# Patient Record
Sex: Female | Born: 1935
Health system: Southern US, Community
[De-identification: ages and names within clinical notes are randomized; demographics above are authoritative.]

## PROBLEM LIST (undated history)

## (undated) DIAGNOSIS — H8109 Meniere's disease, unspecified ear: Secondary | ICD-10-CM

## (undated) DIAGNOSIS — F419 Anxiety disorder, unspecified: Secondary | ICD-10-CM

## (undated) DIAGNOSIS — C7889 Secondary malignant neoplasm of other digestive organs: Secondary | ICD-10-CM

## (undated) DIAGNOSIS — H269 Unspecified cataract: Secondary | ICD-10-CM

## (undated) DIAGNOSIS — I10 Essential (primary) hypertension: Secondary | ICD-10-CM

## (undated) DIAGNOSIS — I8390 Asymptomatic varicose veins of unspecified lower extremity: Secondary | ICD-10-CM

## (undated) DIAGNOSIS — C50919 Malignant neoplasm of unspecified site of unspecified female breast: Secondary | ICD-10-CM

## (undated) DIAGNOSIS — F329 Major depressive disorder, single episode, unspecified: Secondary | ICD-10-CM

## (undated) DIAGNOSIS — N189 Chronic kidney disease, unspecified: Secondary | ICD-10-CM

## (undated) DIAGNOSIS — E785 Hyperlipidemia, unspecified: Secondary | ICD-10-CM

## (undated) DIAGNOSIS — G4700 Insomnia, unspecified: Secondary | ICD-10-CM

## (undated) DIAGNOSIS — J910 Malignant pleural effusion: Secondary | ICD-10-CM

## (undated) DIAGNOSIS — J40 Bronchitis, not specified as acute or chronic: Secondary | ICD-10-CM

## (undated) DIAGNOSIS — C7970 Secondary malignant neoplasm of unspecified adrenal gland: Secondary | ICD-10-CM

## (undated) DIAGNOSIS — J449 Chronic obstructive pulmonary disease, unspecified: Secondary | ICD-10-CM

## (undated) DIAGNOSIS — M7120 Synovial cyst of popliteal space [Baker], unspecified knee: Secondary | ICD-10-CM

## (undated) DIAGNOSIS — C787 Secondary malignant neoplasm of liver and intrahepatic bile duct: Secondary | ICD-10-CM

## (undated) DIAGNOSIS — C4490 Unspecified malignant neoplasm of skin, unspecified: Secondary | ICD-10-CM

## (undated) DIAGNOSIS — F32A Depression, unspecified: Secondary | ICD-10-CM

## (undated) DIAGNOSIS — H3320 Serous retinal detachment, unspecified eye: Secondary | ICD-10-CM

## (undated) DIAGNOSIS — Z923 Personal history of irradiation: Secondary | ICD-10-CM

## (undated) DIAGNOSIS — E079 Disorder of thyroid, unspecified: Secondary | ICD-10-CM

## (undated) HISTORY — PX: APPENDECTOMY: SHX54

## (undated) HISTORY — PX: TONSILLECTOMY: SUR1361

---

## 1989-05-08 HISTORY — PX: THYROIDECTOMY, PARTIAL: SHX18

## 1999-12-02 ENCOUNTER — Encounter: Payer: Self-pay | Admitting: Otolaryngology

## 1999-12-02 ENCOUNTER — Ambulatory Visit (HOSPITAL_COMMUNITY): Admission: RE | Admit: 1999-12-02 | Discharge: 1999-12-02 | Payer: Self-pay | Admitting: Otolaryngology

## 2000-04-24 ENCOUNTER — Other Ambulatory Visit: Admission: RE | Admit: 2000-04-24 | Discharge: 2000-04-24 | Payer: Self-pay | Admitting: General Surgery

## 2000-05-14 ENCOUNTER — Encounter (HOSPITAL_BASED_OUTPATIENT_CLINIC_OR_DEPARTMENT_OTHER): Payer: Self-pay | Admitting: General Surgery

## 2000-05-14 ENCOUNTER — Encounter (INDEPENDENT_AMBULATORY_CARE_PROVIDER_SITE_OTHER): Payer: Self-pay | Admitting: *Deleted

## 2000-05-14 ENCOUNTER — Ambulatory Visit (HOSPITAL_COMMUNITY): Admission: RE | Admit: 2000-05-14 | Discharge: 2000-05-14 | Payer: Self-pay | Admitting: General Surgery

## 2000-05-31 ENCOUNTER — Encounter (HOSPITAL_BASED_OUTPATIENT_CLINIC_OR_DEPARTMENT_OTHER): Payer: Self-pay | Admitting: General Surgery

## 2000-05-31 ENCOUNTER — Encounter (INDEPENDENT_AMBULATORY_CARE_PROVIDER_SITE_OTHER): Payer: Self-pay | Admitting: *Deleted

## 2000-05-31 ENCOUNTER — Inpatient Hospital Stay (HOSPITAL_COMMUNITY): Admission: RE | Admit: 2000-05-31 | Discharge: 2000-06-01 | Payer: Self-pay | Admitting: General Surgery

## 2000-05-31 DIAGNOSIS — C50919 Malignant neoplasm of unspecified site of unspecified female breast: Secondary | ICD-10-CM

## 2000-05-31 HISTORY — DX: Malignant neoplasm of unspecified site of unspecified female breast: C50.919

## 2000-06-19 ENCOUNTER — Encounter: Admission: RE | Admit: 2000-06-19 | Discharge: 2000-09-17 | Payer: Self-pay | Admitting: Radiation Oncology

## 2001-08-12 ENCOUNTER — Encounter: Admission: RE | Admit: 2001-08-12 | Discharge: 2001-08-12 | Payer: Self-pay | Admitting: Otolaryngology

## 2001-08-12 ENCOUNTER — Encounter: Payer: Self-pay | Admitting: Otolaryngology

## 2001-08-13 ENCOUNTER — Ambulatory Visit (HOSPITAL_BASED_OUTPATIENT_CLINIC_OR_DEPARTMENT_OTHER): Admission: RE | Admit: 2001-08-13 | Discharge: 2001-08-13 | Payer: Self-pay | Admitting: Otolaryngology

## 2001-08-13 ENCOUNTER — Encounter (INDEPENDENT_AMBULATORY_CARE_PROVIDER_SITE_OTHER): Payer: Self-pay | Admitting: Specialist

## 2002-12-19 ENCOUNTER — Encounter: Payer: Self-pay | Admitting: Ophthalmology

## 2002-12-23 ENCOUNTER — Ambulatory Visit (HOSPITAL_COMMUNITY): Admission: RE | Admit: 2002-12-23 | Discharge: 2002-12-23 | Payer: Self-pay | Admitting: Ophthalmology

## 2003-03-24 ENCOUNTER — Ambulatory Visit (HOSPITAL_COMMUNITY): Admission: RE | Admit: 2003-03-24 | Discharge: 2003-03-24 | Payer: Self-pay | Admitting: Ophthalmology

## 2003-05-09 HISTORY — PX: PITUITARY SURGERY: SHX203

## 2003-07-23 ENCOUNTER — Encounter: Admission: RE | Admit: 2003-07-23 | Discharge: 2003-07-23 | Payer: Self-pay | Admitting: Internal Medicine

## 2003-08-12 ENCOUNTER — Ambulatory Visit (HOSPITAL_COMMUNITY): Admission: RE | Admit: 2003-08-12 | Discharge: 2003-08-12 | Payer: Self-pay | Admitting: Internal Medicine

## 2003-08-27 ENCOUNTER — Ambulatory Visit (HOSPITAL_COMMUNITY): Admission: RE | Admit: 2003-08-27 | Discharge: 2003-08-27 | Payer: Self-pay | Admitting: Internal Medicine

## 2003-09-28 ENCOUNTER — Encounter (INDEPENDENT_AMBULATORY_CARE_PROVIDER_SITE_OTHER): Payer: Self-pay | Admitting: *Deleted

## 2003-09-28 ENCOUNTER — Inpatient Hospital Stay (HOSPITAL_COMMUNITY): Admission: RE | Admit: 2003-09-28 | Discharge: 2003-10-02 | Payer: Self-pay | Admitting: Otolaryngology

## 2003-10-09 ENCOUNTER — Inpatient Hospital Stay (HOSPITAL_COMMUNITY): Admission: AD | Admit: 2003-10-09 | Discharge: 2003-10-15 | Payer: Self-pay | Admitting: Internal Medicine

## 2006-07-13 ENCOUNTER — Encounter: Admission: RE | Admit: 2006-07-13 | Discharge: 2006-07-13 | Payer: Self-pay | Admitting: Internal Medicine

## 2006-10-06 ENCOUNTER — Encounter: Admission: RE | Admit: 2006-10-06 | Discharge: 2006-10-06 | Payer: Self-pay | Admitting: Neurosurgery

## 2007-07-09 ENCOUNTER — Encounter: Admission: RE | Admit: 2007-07-09 | Discharge: 2007-07-09 | Payer: Self-pay | Admitting: Internal Medicine

## 2007-08-02 ENCOUNTER — Encounter: Admission: RE | Admit: 2007-08-02 | Discharge: 2007-08-02 | Payer: Self-pay | Admitting: Internal Medicine

## 2007-08-05 ENCOUNTER — Encounter: Admission: RE | Admit: 2007-08-05 | Discharge: 2007-08-05 | Payer: Self-pay | Admitting: Internal Medicine

## 2007-08-15 ENCOUNTER — Encounter: Admission: RE | Admit: 2007-08-15 | Discharge: 2007-08-15 | Payer: Self-pay | Admitting: Internal Medicine

## 2007-09-04 ENCOUNTER — Ambulatory Visit: Payer: Self-pay | Admitting: Hematology & Oncology

## 2007-10-02 ENCOUNTER — Encounter: Admission: RE | Admit: 2007-10-02 | Discharge: 2007-10-02 | Payer: Self-pay | Admitting: Surgery

## 2007-10-02 ENCOUNTER — Ambulatory Visit (HOSPITAL_COMMUNITY): Admission: RE | Admit: 2007-10-02 | Discharge: 2007-10-02 | Payer: Self-pay | Admitting: Surgery

## 2007-10-02 ENCOUNTER — Encounter (INDEPENDENT_AMBULATORY_CARE_PROVIDER_SITE_OTHER): Payer: Self-pay | Admitting: Surgery

## 2007-10-02 DIAGNOSIS — C50919 Malignant neoplasm of unspecified site of unspecified female breast: Secondary | ICD-10-CM

## 2007-10-02 HISTORY — DX: Malignant neoplasm of unspecified site of unspecified female breast: C50.919

## 2007-10-25 ENCOUNTER — Ambulatory Visit: Payer: Self-pay | Admitting: Oncology

## 2007-11-04 ENCOUNTER — Ambulatory Visit: Admission: RE | Admit: 2007-11-04 | Discharge: 2008-01-12 | Payer: Self-pay | Admitting: Radiation Oncology

## 2007-11-29 LAB — CBC WITH DIFFERENTIAL/PLATELET
BASO%: 0.5 % (ref 0.0–2.0)
HCT: 40.8 % (ref 34.8–46.6)
HGB: 14.2 g/dL (ref 11.6–15.9)
MCHC: 34.8 g/dL (ref 32.0–36.0)
MONO#: 0.4 10*3/uL (ref 0.1–0.9)
NEUT%: 55 % (ref 39.6–76.8)
WBC: 7.7 10*3/uL (ref 3.9–10.0)
lymph#: 2.9 10*3/uL (ref 0.9–3.3)

## 2008-01-28 ENCOUNTER — Encounter: Admission: RE | Admit: 2008-01-28 | Discharge: 2008-01-28 | Payer: Self-pay | Admitting: Neurosurgery

## 2008-06-15 ENCOUNTER — Encounter: Admission: RE | Admit: 2008-06-15 | Discharge: 2008-06-15 | Payer: Self-pay | Admitting: Internal Medicine

## 2008-06-19 ENCOUNTER — Encounter: Admission: RE | Admit: 2008-06-19 | Discharge: 2008-06-19 | Payer: Self-pay | Admitting: Orthopedic Surgery

## 2010-02-01 ENCOUNTER — Encounter: Admission: RE | Admit: 2010-02-01 | Discharge: 2010-02-01 | Payer: Self-pay | Admitting: Neurosurgery

## 2010-05-29 ENCOUNTER — Encounter: Payer: Self-pay | Admitting: Internal Medicine

## 2010-05-30 ENCOUNTER — Encounter: Payer: Self-pay | Admitting: Neurosurgery

## 2010-09-20 NOTE — Op Note (Signed)
NAMEASHLAND, Brianna Duke              ACCOUNT NO.:  [id number]   MEDICAL RECORD NO.:  [id number]          PATIENT TYPE:  AMB   LOCATION:  SDS                          FACILITY:  [facility]   PHYSICIAN:  Thomas A. Cornett, M.D.DATE OF BIRTH:  02/11/1929   DATE OF PROCEDURE:  10/02/2007  DATE OF DISCHARGE:  10/02/2007                               OPERATIVE REPORT   PREOPERATIVE DIAGNOSIS:  Stage I right breast cancer.   POSTOPERATIVE DIAGNOSIS:  Stage I right breast cancer.   PROCEDURE:  1. Right breast needle-localized lumpectomy.  2. Right axillary sentinel lymph node mapping with injection of      methylene blue dye.   SURGEON:  Maisie Fus A. Cornett, MD   ASSISTANT:  Junious Silk, NP.   ESTIMATED BLOOD LOSS:  10 mL.   SPECIMEN:  1. Right breast mass.  2. Two right axillary sentinel nodes negative by touch prep.   DRAINS:  None.   INDICATIONS FOR PROCEDURE:  The patient is a pleasant 77 year old female  who is found to have a right breast mass on routine screening mammogram.  Core biopsy showed roughly a 1-cm ER/PR positive, HER-2/neu negative  right breast cancer in the upper outer quadrant.  She presents today for  breast conserving measures after discussion of both mastectomy versus  breast conserving options.  She had a history of left breast cancer  diagnosed back in 1992 and undergoing lumpectomy, postoperative  radiation therapy, and left axillary node dissection for a IIB left  breast cancer.   DESCRIPTION OF PROCEDURE:  The patient was brought to the operating room  after undergoing needle localization and right breast injection with  technetium sulfur colloid for sentinel lymph node mapping.  After  induction of general anesthesia, right breast and right axilla were  prepped and draped in sterile fashion.  The axilla was addressed first.  NeoProbe was used and a hot spot was located in the right axilla.  Incision was made in the right axilla, and dissection was carried  down.  Two blue hot sentinel nodes were identified.  Of note, 4 mL of methylene  blue dye was injected in the subareolar position and massaged for 5  minutes prior to doing this.  These nodes were sent to pathology and  were negative by touch prep.  Hemostasis was excellent.   Next, a lumpectomy was done.  Localizing wires were identified.  Curvilinear incision was made of the central upper portion of the breast  and the wire.  All tissue around the wire was excised, sent to  radiology, and this was found to be adequate for radiographs.  Mass clip  and wire were in it.  I inspected the biopsy cavity and saw no residual  tumor.  I then irrigated and closed with 3-0 Vicryl and a 4-0 Monocryl  subcuticular stitch.  The right axilla was examined, and this was  cleaned without signs of bleeding.  This was closed in layers, the deep  layer with a 3-0  Vicryl and a 4-0 Monocryl subcuticular stitch.  Dermabond was applied.  All final counts of sponge, needle, and instruments  were found be  correct at this portion of case.  The patient was awoke and taken to  recovery in satisfactory condition.      Thomas A. Cornett, M.D.  Electronically Signed     TAC/MEDQ  D:  10/02/2007  T:  10/03/2007  Job:  [id number]   cc:   [facility]  Christianne Dolin, MD

## 2010-09-23 NOTE — Op Note (Signed)
NAME:  Brianna Duke, Brianna Duke                        ACCOUNT NO.:  [id number]   MEDICAL RECORD NO.:  [id number]                   PATIENT TYPE:  INP   LOCATION:  3106                                 FACILITY:  [facility]   PHYSICIAN:  Kristine Garbe. Ezzard Standing, M.D.         DATE OF BIRTH:  02/11/1929   DATE OF PROCEDURE:  DATE OF DISCHARGE:                                 OPERATIVE REPORT   PREOPERATIVE DIAGNOSES:  1. Pituitary tumor.  2. Vocal cord polyps.   POSTOPERATIVE DIAGNOSES:  1. Pituitary tumor.  2. Vocal cord polyps.   OPERATION:  1. Transsphenoidal resection of pituitary tumor.  2. Microlaryngoscopy with excision of vocal cord polyps.   SURGEON:  Kristine Garbe. Ezzard Standing, M.D.   Mammie LorenzoHewitt Shorts, M.D.   ANESTHESIA:  General endotracheal.   COMPLICATIONS:  None.   ESTIMATED BLOOD LOSS:  100 mL.   BRIEF CLINICAL NOTE:  Brianna Duke is a 77 year old female who has had a  diagnosis of pituitary tumor by Dr. Newell Coral and is taken to the operating  room at this time for transseptal, transsphenoidal resection of pituitary  tumor.  She has also had increasing hoarseness over the last several years,  has a history of smoking, and on examination has bilateral vocal cord polyps  consistent with chronic polypoid vocal cord laryngitis.  She is taken to the  operating room at this time and will have microlaryngoscopy and excision of  vocal cord polyps at the same time as the resection of the pituitary tumor.   DESCRIPTION OF PROCEDURE:  After adequate endotracheal anesthesia, the  patient was positioned on the OR bed by Dr. Newell Coral.  Nose was then prepped  with cotton pledgets soaked in 4% cocaine solution and the septum and floor  of the nose were injected with Xylocaine with epinephrine.  The nose and  abdomen were then prepped with Betadine solution and draped out with sterile  towels.  First myself, Dr. Ezzard Standing, did the approach through the right  nostril.  An extended  hemitransfixion incision was made along the caudal  edge of the septum on the right side and extended down to the floor of the  nose.  Mucoperiosteal and mucoperichondrial flaps were elevated posteriorly.  On elevating the mucoperichondrium off of the septal cartilage, the majority  of the septal cartilage was missing and there was just fibrous adhesions  with very minimal remnants of fibrocartilaginous tissue.  I suspect this may  be related to either previous trauma or previous surgery on the septum.  There is really no available cartilage to harvest for later use.  On more  posterior dissection, bony septum was still present and portions of the bony  septum and bony vomer were preserved for later use.  Mucoperichondrium and  mucoperiosteum were elevated off of the floor of the nose on the left side,  along the right side of the septum, and extending down to the floor of the  nose on the left side.  The remaining cartilage was reflected into the left  airway.  The Hardy retractors were then positioned to expose the anterior  wall of the sphenoid sinus.  Using the small osteotome, the sphenoidotomy  was performed.  This was enlarged with rongeurs and Kerrisons superiorly and  laterally.  After obtaining adequate exposure of the sella, Dr. Newell Coral  performed a resection of the sphenoid tumor.  After resection of the  sphenoid tumor, I was called back in to close the approach.  The sphenoid  sinus was packed with fat and Gelfoam.  The septum was brought back to  midline, mucoperichondrial and mucoperiosteal flaps were brought back, and  the hemitransfixion incision was closed with interrupted 4-0 chromic  sutures, the septum was basted with a 3-0 chromic suture, and Doyle splints  were secured to either side of the septum with a 3-0 nylon suture.  The nose  was then packed with Telfa soaked in bacitracin ointment bilaterally.  Upon  completion of the closure of the transseptal,  transsphenoidal resection of  the pituitary tumor, microlaryngoscopy was performed.  The throat pack was  removed, oropharynx was suctioned, and direct laryngoscopy was performed.  The base of tongue and epiglottis were normal.  On evaluation of the vocal  cords, the patient had bilateral vocal cord edema and vocal cord polyps  consistent with chronic polypoid laryngitis.  The polypoid tissue was  grasped and the redundant mucosa and submucosal tissue was resected with cup  forceps and scissors.  Cotton pledgets soaked in 1:100,000 epinephrine were  placed for hemostasis.  Photos were obtained.  This completed the procedure.  The patient was subsequently awakened from anesthesia and transferred to the  recovery room postop doing well.   DISPOSITION:  Madinah will be admitted to the neuro intensive care unit.  Will  plan on removing the nasal packing in two to three days.  Will recommend  voice rest for two weeks and antacid therapy for two weeks.  Will have her  follow up in my office in 10 to 12 days for recheck and remove the septal  splints.                                               Kristine Garbe. Ezzard Standing, M.D.    CEN/MEDQ  D:  09/28/2003  T:  09/29/2003  Job:  [id number]   cc:   Hewitt Shorts, M.D.  [address]  [place]  [place] [address]  Fax: [phone]

## 2010-09-23 NOTE — H&P (Signed)
NAME:  Brianna Duke, Brianna Duke                        ACCOUNT NO.:  [id number]   MEDICAL RECORD NO.:  [id number]                   PATIENT TYPE:  INP   LOCATION:  3106                                 FACILITY:  [facility]   PHYSICIAN:  Hewitt Shorts, M.D.            DATE OF BIRTH:  02/11/1929   DATE OF ADMISSION:  09/28/2003  DATE OF DISCHARGE:                                HISTORY & PHYSICAL   HISTORY OF PRESENT ILLNESS:  The patient is a 77 year old, right handed,  white female who was evaluated for pituitary mass.  It was an incidental  finding on an MRI done for evaluation of nightmares.  The patient has had  some longstanding occasional difficulties with headaches.  She has some  headache a couple times a week.  She describes mild weight gain due to a  relatively sedentary level of activity.  She denies any visual difficulties,  and she denies any breast discharge.  The patient was evaluated with an MRI  which showed a 1-cm diameter suprasellar mass that abuts the anterior aspect  of the optic chiasm.  The patient underwent ophthalmologic evaluation with  Dr. Cecilie Kicks.  He found an old superior visual field defect but the visual  fields were otherwise normal.  Her endocrinologic evaluation was  unremarkable.  The patient is admitted now for transsphenoidal resection of  the pituitary mass.   PAST MEDICAL HISTORY:  No history of hypertension, myocardial infarction,  cancer, stroke, diabetes, peptic ulcer disease, or lung disease.   PREVIOUS SURGERY:  1. Thyroid surgery.  She is on replacement with Synthroid.  2. She underwent right ear surgery.  3. She has had bilateral cataract surgery, last year.  4. Detached retina, treated about 30 years ago.   ALLERGIES:  She denies allergies to medications.   CURRENT MEDICATIONS:  Synthroid.   FAMILY HISTORY:  Parents have both passed on.   SOCIAL HISTORY:  The patient is divorced.  She is retired.  She smokes a  pack a day.  She has been  smoking for 25 years.  She drinks alcoholic  beverages rarely.  She denies a history of substance abuse.   REVIEW OF SYSTEMS:  Notable for what is described in her history of present  illness and past medical history, but is otherwise unremarkable.   PHYSICAL EXAMINATION:  GENERAL:  The patient is a well-developed, well-  nourished, white female in no acute distress.  VITAL SIGNS:  Temperature 97.9, pulse 72, blood pressure 122/71, respiratory  rate 20.  Height 5 foot 4, weight 159 pounds.  LUNGS:  Clear to auscultation.  She has symmetrical respirations.  HEART:  Regular rate and rhythm.  S1 S2.  There is no murmur.  ABDOMEN:  Soft, nondistended.  Bowel sounds are present.  EXTREMITIES:  Show no clubbing, cyanosis, edema.  NEUROLOGIC:  Shows the patient awake, alert, fully oriented.  Speech is  fluent.  She has good comprehension.  Cranial nerves show visual fields are  intact to confrontation with a red target.  Pupils equal, round and reactive  to light and about 2.5-mm bilaterally.  Extraocular movements are intact.  Facial movement is symmetrical.  Hearing is present bilaterally.  Palate  movement is symmetrical.  Shoulder shrug is symmetrical.  Tongue is midline.  Motor:  Shows 5/5 strength in the upper and lower extremities.  She has no  drift of the upper extremities.  She has a normal gait.  Sensation is intact  to pinprick.  Reflexes are minimal to trace in the upper and lower  extremities including the biceps, brachialis, triceps, quadriceps, and  gastrocnemii.   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   Both clinically and laboratory wise, she has no endocrinopathy, other than  for thyroid replacement she requires because of her previous thyroid  surgery.  Visual fields are intact.   PLAN:  The patient is to be admitted for transsphenoidal resection of  pituitary mass done in conjunction with Dr. Kristine Garbe. Newman from the   ENT service.  I have had an opportunity to discuss alternatives to surgery  including monitoring with serial MRI scans as well as surgical intervention.  We discussed the details of surgery and limitations postoperatively, and  risks to include risk of infection including meningitis and sinusitis, the  risk of bleeding possibly requiring transfusion, the risk of neurologic  dysfunction with loss of vision, paralysis, and death, anesthetic risk of  myocardial infarction, stroke, paralysis and death.   The patient strongly wishes to go ahead with surgery, and, therefore, is  admitted for such.                                                Hewitt Shorts, M.D.    RWN/MEDQ  D:  09/28/2003  T:  09/28/2003  Job:  [id number]   cc:   Kristine Garbe. Ezzard Standing, M.D.  [address]  [place] [address]  Fax: [phone]   patient's chart

## 2010-09-23 NOTE — Op Note (Signed)
NAMEBREANN, LOSANO NO.:  [id number]   MEDICAL RECORD NO.:  [id number]                   PATIENT TYPE:  OIB   LOCATION:  2899                                 FACILITY:  [facility]   PHYSICIAN:  Guadelupe Sabin, M.D.             DATE OF BIRTH:  [birth year]   DATE OF PROCEDURE:  03/24/2003  DATE OF DISCHARGE:  03/24/2003                                 OPERATIVE REPORT   DATE OF SURGERY:  March 24, 2003.   PREOPERATIVE DIAGNOSES:  Senile nuclear cataract right eye, old scleral  buckling for retinal detachment 1973.   OPERATION:  Planned extracapsular cataract extraction--phacoemulsification,  primary insertion of posterior chamber intraocular lens implant right eye.   SURGEON:  Guadelupe Sabin, M.D.   ASSESSMENT:  Nurse.   ANESTHESIA:  General.   OPERATIVE PROCEDURE:  After the patient was prepped and draped with suitable  anesthesia, the patient was also given 1 mL of retrobulbar blocking  consisting of 0.75 Marcaine and 4% Xylocaine with Wydase added.  The lid  speculum was inserted in the right eye.  The eye was turned downward and a  superior rectus traction suture placed.  Tonometry was recorded at 6 to 7  scale units with a 5.5 gram weight indicating a normal intraocular pressure.  A peritomy was performed adjacent to the limbus from 11 to 1 o'clock  position.  The corneoscleral junction was cleaned and a corneoscleral groove  made with a 45 degree Superblade.  The anterior chamber was then entered  with the 2.5 mm diamond keratome at the 12 o'clock position and the 15  degree blade at the 2:30 position.  Using a bent 26 gauge needle on a  OcuCoat syringe, a circular capsulorrhexis was begun and then completed with  the Grabow forceps.  Hydrodissection and hydrodelineation were performed  using 1% Xylocaine.  The 30 degree phacoemulsification tip was then inserted  with slow controlled emulsification of the lens nucleus.  Total  ultrasonic  time 57 seconds, average power levels 14%, total amount of fluid used 70 mL.  Following removal of the nucleus, the residual cortex was aspirated with the  irrigation aspiration tip.  The posterior capsule appeared intact with a  brilliant red fundus reflex.  Was therefore elected to insert an [employer]er[employer] SI 40 NB silicone three-piece posterior chamber intraocular  lens implant, diopter strength plus 21.00.  This was inserted with the  McDonald forceps into the anterior chamber and then centered into the  capsular bag.  The lens appeared to be well centered.  The Healon and  OcuCoat which had been used during the procedure was aspirated and replaced  with balanced salt solution and Miochol ophthalmic solution.  The operative  incisions appeared to be self-sealing.  Since the patient, however, was  under general anesthesia, it was elected to place one 10-0 interrupted nylon  suture across the incision in  case the patient had involuntary movements on  awakening.  Maxitrol ointment was instilled in the conjunctival cul-de-sac  and a light patch applied and protective shield.  Duration of procedure and  anesthesia administration 45 minutes.  The patient tolerated the procedure  well in general, left the operating room for the recovery room in good  condition.                                              Guadelupe Sabin, M.D.   Davy Pique  D:  05/08/2003  T:  05/08/2003  Job:  [id number]

## 2010-09-23 NOTE — Op Note (Signed)
[place]. [facility]  Patient:    Brianna Duke, Brianna Duke                       MRN: [id number] Proc. Date: 05/14/00 Attending:  Luisa Hart L. Lurene Shadow, M.D.                           Operative Report  PREOPERATIVE DIAGNOSIS:  Left breast mass, rule out carcinoma.  POSTOPERATIVE DIAGNOSIS:  Invasive carcinoma, left breast.  PROCEDURE:  Wide excision of left breast mass.  SURGEON:  Mardene Celeste. Lurene Shadow, M.D.  ASSISTANT:  Nurse.  ANESTHESIA:  General.  INDICATION:  The patient is a 77 year old female presenting with a palpable left breast mass, which on mammogram is suspicious, BI-RADS 4.  She underwent fine needle aspirate, which was equivocal with results showing atypical cells but no definite carcinoma.  She is brought to the operating room for excisional biopsy.  DESCRIPTION OF PROCEDURE:  Following induction of anesthesia, the patient was positioned supinely and the left breast was prepped and draped to be included in the sterile operative field.  The mass, which was located in the 6 oclock axis of the left breast, is approached through an elliptical incision, deepened through the skin and subcutaneous tissues, and made transversely. The mass was dissected free on all sides down to the chest wall and removed and forwarded for pathologic evaluation.  Frozen section showed invasive carcinoma with the superior margin being closest at approximately 3 mm. Hemostasis was secured with electrocautery.  Sponge, instruments, and sharp counts were verified.  Subcutaneous tissues closed with interrupted 3-0 Vicryl suture, skin closed with 5-0 Monocryl and reinforced with Steri-Strips. Sterile dressings applied.  Anesthetic reversed.  Patient removed from the operating room to the recovery room in stable condition, having tolerated the procedure well. DD:  05/14/00 TD:  05/14/00 Job: [id number]

## 2010-09-23 NOTE — Op Note (Signed)
[place]. [facility]  Patient:    Brianna Duke, Brianna Duke Visit Number: [id number] MRN: [id number]          Service Type: DSU Location: [facility] Attending Physician:  Waldon Merl Dictated by:   Keturah Barre, M.D. Proc. Date: 08/13/01 Admit Date:  08/13/2001 Discharge Date: 08/13/2001   CC:         Dr. 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  OPERATION PERFORMED:  Excision of squamous cell carcinoma 2.7 cm x 0.7 cm with rotational flap reconstruction.  SURGEON:  Keturah Barre, M.D.  ANESTHESIA:  Local with general standby.  INDICATIONS FOR PROCEDURE:  The patient is a 77 year old female who I biopsied a lesion in the right lower lid and it returned squamous cell carcinoma.  She now enters for excision of squamous cell carcinoma of the right lower eyelid with rotation flap reconstruction.  DESCRIPTION OF PROCEDURE:  The patient was placed supine position and the patient was prepped with Betadine and then the marking pen was used to mark off the area of excision.  Then also the superiorly based rotator flap.  The lesion was excised removing ample edges on this margin on this squamous cell carcinoma and then once this lesion was excised, all hemostasis was established with Bovie electrocoagulation and the flap was then developed using a new blade.  The flap site was closed, the donor site was then closed with 5-0 Ethilon and the flap was rotated laterally into the lower lid defect. All hemostasis was checked and completed and then the flap was sutured in placed using 5-0 Ethilon.  Once this was completed, the Steri-Strips were applied.  The patient tolerated the procedure well and was doing well postoperatively.  Follow-up will be in two days, then in a week, then two weeks, four weeks, six weeks, six  months and a year. Dictated by:   Keturah Barre, M.D. Attending Physician:  Waldon Merl DD:  08/13/01 TD:  08/13/01 Job: [id number]

## 2010-09-23 NOTE — H&P (Signed)
NAMEHENLEY, Brianna NO.:  [id number]   MEDICAL RECORD NO.:  [id number]                   PATIENT TYPE:  OIB   LOCATION:  2899                                 FACILITY:  [facility]   PHYSICIAN:  Guadelupe Sabin, M.D.             DATE OF BIRTH:  1928/04/17   DATE OF ADMISSION:  12/23/2002  DATE OF DISCHARGE:                                HISTORY & PHYSICAL   PREOPERATIVE DIAGNOSIS:  Senile nuclear cataract left eye.   POSTOPERATIVE DIAGNOSIS:  Senile nuclear cataract left eye.   OPERATION:  Planned extracapsular cataract extraction - phacoemulsification,  primary insertion of posterior chamber intraocular lens implant.   SURGEON:  Guadelupe Sabin, M.D.   ASSISTANT:  Nurse.   ANESTHESIA:  General plus retrobulbar injection with 4% Xylocaine, 0.75  Marcaine.   DESCRIPTION OF PROCEDURE:  After the patient had intubation and general  anesthesia, a lid speculum was inserted in the left eye.  The eye was turned  downward and a superior rectus traction suture placed.  A peritomy was  performed adjacent to the limbus from the 11 to 1 o'clock position.  The  corneoscleral junction was cleaned and a corneoscleral groove made with a 45  degree Superblade.  The anterior chamber was then entered with the 2.5 mm  diamond keratome at the 12 o'clock position and the 15 degree blade at the  2:30 position.  Using a bent 26-gauge needle on a Healon syringe, a circular  capsulorrhexis was begun and then completed with the Grabow forceps.  Hydrodissection and hydrodelineation were performed using 1% Xylocaine.  The  30 degree phacoemulsification tip was then inserted with slow controlled  emulsification of the lens nucleus.  Following removal of the nucleus, the  residual cortex was aspirated with the irrigation aspiration tip.  The  posterior capsule appeared intact with a brilliant red fundus reflex.  Total  ultrasonic time was 57 seconds, average power level  19%, total amount of  fluid use 95 mL.  It was then elected to insert an Aller[employer] Optics  SI40 NB silicone three-piece posterior chamber intraocular lens implant,  diopter strength +21.00.  This was inserted with the McDonald forceps into  the anterior chamber and then centered into the capsular bag using the  [facility] lens rotator.  The lens appeared to be well centered.  The Healon  which had been used throughout the procedure was aspirated and replaced with  balanced salt solution and Miochol ophthalmic solution.  A single 10.0  interrupted nylon suture was used to close the 12 o'clock incision.  TobraDex ophthalmic ointment and Pilopine ophthalmic ointment 4% were  instilled in the conjunctival cul-de-sac.  A light patch and protector  shield were applied to the operated left eye.  Duration of procedure was 45  minutes.  The patient left the operating room for the recovery room in good  condition.  Guadelupe Sabin, M.D.    HNJ/MEDQ  D:  12/23/2002  T:  12/23/2002  Job:  [id number]

## 2010-09-23 NOTE — Discharge Summary (Signed)
NAME:  Brianna Duke, Brianna Duke                        ACCOUNT NO.:  [id number]   MEDICAL RECORD NO.:  [id number]                   PATIENT TYPE:  INP   LOCATION:  3106                                 FACILITY:  [facility]   PHYSICIAN:  Hewitt Shorts, M.D.            DATE OF BIRTH:  02/11/1929   DATE OF ADMISSION:  09/28/2003  DATE OF DISCHARGE:  10/02/2003                                 DISCHARGE SUMMARY   ADMISSION HISTORY AND PHYSICAL:  This is a 77 year old woman who was found  to have a pituitary tumor on evaluation of nightmares.  She had no clinical  or laboratory evidence of endocrinopathy, nor any visual field deficit,  although the tumor did abut the optic chiasm, and decision was made to  proceed with resection of this lesion.  General examination was  unremarkable.  Neurological examination was intact.   HOSPITAL COURSE:  The patient was admitted and underwent transnodal  resection of the pituitary tumor in a combined neurosurgical and ENT  procedure with myself and Dr. Narda Bonds.  Dr. Ezzard Standing also performed a  microlaryngoscopy and excision of vocal cord polyps.  Pathology found  evidence of pituitary adenoma.  The patient has done well following surgery.  She has remained neurologically intact.  She has been able to be up and  ambulating.  She has had some relatively high urine outputs however.  Her  specific gravities, and more importantly, her serum sodiums have remained  stable without definitive evidence of diabetes insipidus.  She has never  required any treatment with TV, AVP or other form of vasopressin during the  hospitalization.  Dr. Ezzard Standing was able to remove her packings and she was  continued on antibiotics until the packings were removed.  She is asking to  be discharged to home.  She has been given instructions regarding activities  and followup.  She is to return to see Dr. Ezzard Standing in about a week and is to  return to see me in about 2 weeks.  I have also  recommended that she  followup with her primary physician in the next couple of weeks.  No  discharge prescriptions were given.  From the neurosurgical service Dr.  Ezzard Standing gave her a prescription for Nexium to reduce reflux.  She was advised  to use Tylenol or Advil as needed for discomfort.   DISCHARGE DIAGNOSIS:  Pituitary adenoma.                                                Hewitt Shorts, M.D.    RWN/MEDQ  D:  10/02/2003  T:  10/04/2003  Job:  [id number]   cc:   Kristine Garbe. Ezzard Standing, M.D.  [address]  [place] [address]  Fax: [phone]   Belia Heman.  Julian Reil, M.D.  [address]  [place], [place] [address]  Fax: [phone]

## 2010-09-23 NOTE — H&P (Signed)
Brianna Duke, Duke NO.:  [id number]   MEDICAL RECORD NO.:  [id number]                   PATIENT TYPE:  OIB   LOCATION:  2899                                 FACILITY:  [facility]   PHYSICIAN:  Guadelupe Sabin, M.D.             DATE OF BIRTH:  09-17-1927   DATE OF ADMISSION:  03/24/2003  DATE OF DISCHARGE:  03/24/2003                                HISTORY & PHYSICAL   This was a planned outpatient readmission of this 77 year old white female  admitted for cataract implant surgery of the right eye.   HISTORY OF PRESENT ILLNESS:  This patient had been noted to have  deterioration in vision in both eyes.  She has had a past scleral buckling  procedure performed on the right eye for retinal detachment.  Subsequently,  more recently, on December 23, 2002, the patient was admitted for elective  cataract implant surgery of the left eye.   Dictation ended at this point.                                                Guadelupe Sabin, M.D.    Davy Pique  D:  05/08/2003  T:  05/08/2003  Job:  [id number]

## 2010-09-23 NOTE — H&P (Signed)
Brianna Duke, FINE NO.:  [id number]   MEDICAL RECORD NO.:  [id number]                   PATIENT TYPE:  OIB   LOCATION:  2899                                 FACILITY:  [facility]   PHYSICIAN:  Guadelupe Sabin, M.D.             DATE OF BIRTH:  [birth year]   DATE OF ADMISSION:  12/23/2002  DATE OF DISCHARGE:                                HISTORY & PHYSICAL   REASON FOR ADMISSION:  This was a planned outpatient surgical admission of  this 77 year old white female admitted for cataract implant surgery of the  left eye.   HISTORY OF PRESENT ILLNESS:  This patient has been followed in my office  since 1973.  Originally at that time, she was found to have a retinal  detachment of the right eye and underwent a scleral buckling procedure with  retinal reattachment.  More recently, the patient has noted blurred vision  in both eyes due to progressive cataract formation, greater in the left than  right eye.  When seen on November 26, 2002, vision was recorded at 20/70- right  eye, 20/400 left eye with best correction.  It was elected to proceed with  cataract implant surgery due to the patient's decreased vision and  symptomatic complaints of blurred vision.  The patient was given oral  discussion and printed information concerning the procedure and its possible  complications.  She signed an informed consent and arrangements made for her  outpatient admission.   CURRENT MEDICATIONS:  Synthroid and tamoxifen.  She takes over-the-counter  allergy drops for her eyes.   PAST MEDICAL HISTORY:  The patient is in stable general health under the  care of her family physician.   REVIEW OF SYSTEMS:  No cardiorespiratory complaints.   PHYSICAL EXAMINATION:  GENERAL APPEARANCE:  The patient is an alert, well-  developed, well-nourished white female in no acute distress.  HEENT:  Eyes:  Visual acuity as noted above, 20/70 right eye, 20/400 left  eye.  Slit lamp  examination bilateral nuclear cataract formation.  Detailed  fundus examination reveals a retinal reattachment of the right eye and  retinal attachment in the left eye.  No retinal tears or detachment areas  are noted in either eye at the present time.  LUNGS:  Clear to percussion and auscultation.  CARDIOVASCULAR:  Normal sinus rhythm.  No cardiomegaly.  No murmurs.  ABDOMEN:  Negative.  EXTREMITIES:  Negative.   ADMISSION DIAGNOSIS:  Senile nuclear cataract both eyes, old scleral  buckling for retinal detachment right eye.   PLAN:  Planned extracapsular cataract extraction with primary insertion of  posterior chamber intraocular lens implant left eye.  Guadelupe Sabin, M.D.    HNJ/MEDQ  D:  12/23/2002  T:  12/23/2002  Job:  [id number]

## 2010-09-23 NOTE — H&P (Signed)
NAMECLYDEAN, POSAS NO.:  [id number]   MEDICAL RECORD NO.:  [id number]                   PATIENT TYPE:  OIB   LOCATION:  2899                                 FACILITY:  [facility]   PHYSICIAN:  Guadelupe Sabin, M.D.             DATE OF BIRTH:  1927/08/21   DATE OF ADMISSION:  03/24/2003  DATE OF DISCHARGE:  03/24/2003                                HISTORY & PHYSICAL   OUTPATIENT READMISSION NOTE:  This was a planned outpatient readmission of  this 77 year old white female admitted for cataract implant surgery of the  right eye.   HISTORY OF PRESENT ILLNESS:  This patient was previously admitted on December 23, 2002 for cataract implant surgery of the left eye (see old detailed  chart).  This patient has noted decreased vision in both eyes due to  progressive cataract formation.  She was previously admitted on the above  date and had uncomplicated cataract implant surgery of the left eye.  Vision  returned from 20/400 to 20/20 minus 2 without correction.  Meanwhile, the  unoperated eye was 20/100 plus 2 without correction.  The patient was  experiencing a blurred vision and enjoying vision in her operated eye  without correction.  She was hopeful of obtaining similar results in her  right eye.  She signed an informed consent and arrangements were made for  her outpatient readmission.   PAST MEDICAL HISTORY:  See old chart.  The patient is in stable general  health under the care of her family physician.   CURRENT MEDICATIONS:  Synthroid and tamoxifen.   PHYSICAL EXAMINATION:  VITAL SIGNS:  As recorded on admission.  Blood  pressure 118/66, respirations 18, pulse 83, temperature 97.4.  GENERAL APPEARANCE:  The patient is a very anxious, well-nourished, well-  developed white female in no acute distress.  HEENT:  Eyes:  Eyes are white and clear with the above visual acuity.  A  nuclear cataract is noted in the right eye and a posterior clear, well-  centered implant in the left eye.  Detailed fundus examination revealed old  scleral buckling procedure of the right eye for retinal detachment (1973).  The retina is now attached.  The optic nerve blood vessels and macula appear  normal.  Applanation tonometry 17 mm each eye.  CHEST:  Lungs clear to percussion and auscultation.  HEART:  Normal sinus rhythm.  No cardiomegaly.  No murmurs.  ABDOMEN:  Negative.  EXTREMITIES:  Negative.   ADMISSION DIAGNOSIS:  Senile nuclear cataract right eye status post old 17  retinal detachment scleral buckling procedure right eye, pseudophakia left  eye.   SURGICAL PLAN:  Cataract implant surgery under general anesthesia.  The  patient is extremely anxious and is it is felt that it was in the best  interest of this patient to be completely asleep during the operative  procedure as was scheduled on her previous cataract  surgery.                                                Guadelupe Sabin, M.D.    Davy Pique  D:  05/08/2003  T:  05/08/2003  Job:  [id number]

## 2010-09-23 NOTE — Discharge Summary (Signed)
NAME:  Brianna Duke, Brianna Duke                        ACCOUNT NO.:  [id number]   MEDICAL RECORD NO.:  [id number]                   PATIENT TYPE:  INP   LOCATION:  4712                                 FACILITY:  [facility]   PHYSICIAN:  Ike Bene, M.D.              DATE OF BIRTH:  02/11/1929   DATE OF ADMISSION:  10/09/2003  DATE OF DISCHARGE:  10/15/2003                                 DISCHARGE SUMMARY   DISCHARGE DIAGNOSES:  1. Hyponatremia and hypokalemia.  2. Hypovolemia.  3. Nausea and vomiting.  4. Depression with significant anxiety.   HOSPITAL COURSE:  [name] was admitted on June 3 after a five day  history of nausea, vomiting and diarrhea.  This was proceeded by a surgical  intervention for a transsphenoidal resection of pituitary gland.  The  patient was noted to have significant hyponatremia with a sodium of 113 in  our office prior to admission.  The patient was aggressively treated with IV  hydration along with correction of her electrolytes.   Secondary to her hypoadrenal state which was determined by low ACTH level  related to her transsphenoidal resection of her pituitary gland, we elected  to increase her steroid dose.  At that point, she was able to retain her  sodium, and her symptoms did improve fairly rapidly at that point.  On June  6, her sodium had actually started to drop again when we actually held her  fluids, and it was felt that she was on a subtherapeutic dose of steroids at  that time.  Her hydrocortisone was increased substantially.  Her ACTH level  was determined to be low.   By June 8, sodium was up to 138, potassium normal at 3.9.  Renal function  was normal.  Her nausea, vomiting and weakness had resolved almost  completely.   LABORATORY DATA:  Her laboratories were essentially normal at the time of  discharge with a sodium of 134.   She was able to go about 48 hours without IV therapy with an aggressive  treatment with oral  hydrocortisone.  Hyperthyroidism and anxiety levels  remained normal.   DISCHARGE MEDICATIONS:  1. Synthroid 75 micrograms daily.  2. Hydrochlorothiazide 50 mg p.o. t.i.d.  3. Ativan 1 mg p.o. daily, p.r.n.   DISCHARGE INSTRUCTIONS:  She was instructed to have a generous sodium intake  in her diet.   FOLLOWUP:  She was to follow up on Monday or Tuesday early the following  week after discharge with Dr. Benedetto Goad.   DISCHARGE CONDITION:  Improved.                                                Ike Bene, M.D.    RM/MEDQ  D:  11/12/2003  T:  11/12/2003  Job:  [id number]

## 2010-09-23 NOTE — Op Note (Signed)
NAME:  Brianna Duke, Brianna Duke                        ACCOUNT NO.:  [id number]   MEDICAL RECORD NO.:  [id number]                   PATIENT TYPE:  INP   LOCATION:  3106                                 FACILITY:  [facility]   PHYSICIAN:  Hewitt Shorts, M.D.            DATE OF BIRTH:  02/11/1929   DATE OF PROCEDURE:  09/28/2003  DATE OF DISCHARGE:                                 OPERATIVE REPORT   PREOPERATIVE DIAGNOSIS:  Pituitary tumor.   POSTOPERATIVE DIAGNOSIS:  Pituitary tumor.   PROCEDURE:  Trans-sphenoidal dissection of pituitary tumor with  microdissection.   CO-SURGEON:  Hewitt Shorts, M.D.  Kristine Garbe. Ezzard Standing, M.D.   INDICATIONS FOR PROCEDURE:  The patient is a 77 year old woman who presented  with a pituitary mass.  The decision was made to proceed with resection  through a trans-sphenoidal route.   The procedure was done in a combined fashion between the ENT and  neurosurgical services, this is the dictation of the neurosurgical portion  of the procedure.   PROCEDURE:  The patient was brought to the operating room and placed under  general endotracheal anesthesia.  The patient was positioned with her head  resting on a donut headrest and the head was gently tilted towards the left  and rotated to the right.  The C-arm fluoroscopy unit along with the  operating microscope were set up to allow for the use interoperatively.  Then, Dr. Ezzard Standing prepped the nasal and facial area.  The abdomen was prepped  with Betadine soaping solution.  Both areas were draped in a sterile  fashion.  Dr. Ezzard Standing then proceeded with the exposure of the sphenoid sinus  and sella.  That portion will be dictated separately.   Once Dr. Ezzard Standing had adequately exposed the sphenoid sinus and the sella, I  scrubbed into the case.  We harvested a fat graft from the right side of the  abdomen.  A horizontal incision was made and carried down through the  subcutaneous tissues.  Using scissors, we were  able to harvest fat graft for  later use in the sella and sphenoid sinus.  Hemostasis was established with  the use of bipolar cautery as well as electrocautery.  Then, the  subcutaneous tissue and subcuticular tissue were closed with interrupted  inverted 2-0 and 3-0 Vicryl sutures, the skin was closed with Dermabond.   Intracranially, the anterior wall of the sella was hard enough that a micro-  bone curet was unable to begin the bony removal and, therefore, we used the  [facility] Max drill with the diamond bit bur to initiate the removal of the  anterior wall of the sella.  This continued using a variety of Kerrison  punches.  The microscope had been draped and brought onto the field to  provide magnification, illumination, and visualization, and the entire  resection of  the pituitary mass was done with microsurgical technique.  The  dura was  exposed.  We took a 25 gauge spinal needle and passed it  just  inside the dura and aspirated, no blood was drained.  Then, the dura was  opened in an X-shaped cruciate fashion.  The edges were coagulated as  necessary with bipolar cautery.  The tissue immediately encountered was  removed using a small up angled ring curet.  This was sent to pathology for  permanent specimen as pituitary tissue.  Deep to that was another distinctly  different piece of tissue that had a variated appearance and appeared to  have some fat tissue and maybe some chondromatous tissue.  This was  mobilized and removed and sent to pathology as a second specimen as a  pituitary tumor.  After that was removed, the arachnoid billowed down into  the resection site and there was a small amount of CSF leakage.  We examined  the intrasellar space, no further tumor was found, and we took a small piece  of fat, packed it in the site where the tumor had been removed, a small  piece of nasal cartilage and bone was used to help buttress the bony opening  into the sella, and then  additional fat was packed in the sphenoid sinus.   Dr. Ezzard Standing scrubbed back into the case and proceeded with the closure of the  trans-sphenoidal exposure.  Estimated blood loss at the completion of the  neurosurgical portion of the procedure was approximately 100 mL.  Sponge and  needle counts were correct.  Subsequent to the closure by Dr. Ezzard Standing, the  patient proceeded to the recovery room.                                               Hewitt Shorts, M.D.    RWN/MEDQ  D:  09/28/2003  T:  09/28/2003  Job:  [id number]   cc:   Kristine Garbe. Ezzard Standing, M.D.  [address]  [place] [address]  Fax: [phone]

## 2010-09-23 NOTE — H&P (Signed)
NAME:  Brianna Duke, Brianna Duke                        ACCOUNT NO.:  [id number]   MEDICAL RECORD NO.:  [id number]                   PATIENT TYPE:  INP   LOCATION:  4712                                 FACILITY:  [facility]   PHYSICIAN:  Brianna Duke, M.D.          DATE OF BIRTH:  02/11/1929   DATE OF ADMISSION:  10/09/2003  DATE OF DISCHARGE:                                HISTORY & PHYSICAL   CHIEF COMPLAINT:  Nausea, vomiting and diarrhea.   HISTORY OF PRESENT ILLNESS:  Brianna Duke is a pleasant 77 year old female, patient  of Dr. Ike Duke, who has had a five day history of nausea, vomiting,  and diarrhea.  She states she has been unable to keep anything down the last  24 hours.  She states she has been vomiting up brown liquid and stools have  been similar but darker.  She has seen no blood.  She states that her  stomach is sore above the umbilicus.  She has had chills but has not checked  her temp.  She feels weak and dehydrated.  She was discharged from the  hospital last week, after having a trans-sphenoidal resection of a pituitary  mass, per Dr. Newell Duke and Dr. Kristine Garbe. Duke.  We do not have the  records of that hospitalization yet, but the patient reportedly did well.  In the office, she was found to be dehydrated with a sodium of 113.  She is  being admitted for the same.   REVIEW OF SYSTEMS:  Positive fatigue.  No documented temp but definitely  chills.  HEENT:  No visual disturbances.  No ear pain.  Has had a frontal  headache.  CHEST:  No pain, tightness, or dyspnea.  No cough.  ABDOMEN:  Soreness above the umbilicus.  Some tenderness.  Nausea and vomiting as  above.  No tarry black stools or black emesis.  No peripheral edema.   PAST MEDICAL HISTORY:  1. Breast cancer.  2. Hypercholesterolemia.  3. Anxiety.  4. Hypothyroidism.  5. Nicotine use.   SURGICAL HISTORY:  1. As above.  2. Tonsillectomy.  3. Adenoidectomy.  4. Appendectomy.  5. Partial  thyroidectomy, on thyroid medication.  6. Detached retina repair.   SOCIAL HISTORY:  She does smoke.  Drinks ETOH in rare settings.   FAMILY HISTORY:  Noncontributory.   MEDICATIONS:  1. Synthroid 0.075 mg one a day.  2. Ativan 1 mg p.o. b.i.d. every day p.r.n.   PHYSICAL EXAMINATION:  VITAL SIGNS:  Weight 164 which is stable, temp 96.9,  pulse 66, blood pressure lying 120/80, sitting 100/80.  GENERAL:  Mucous membranes dry.  She is pale.  Answers appropriately but  appears somnolent.  HEENT:  PERRLA.  Ears, nose, and throat unremarkable.  NECK:  No JVD.  HEART:  Normal S1 S2 without murmur or rub.  LUNGS:  Clear throughout.  Had no cough.  ABDOMEN:  Soft and nondistended.  Bowel sounds present  in all four  quadrants.  She has some slight tenderness above the umbilicus but there is  no obvious guarding or rebound.  RECTAL:  Reveals light to moderately brown stool that is heme positive.  EXTREMITIES:  No edema.   LABORATORY DATA:  Reveals an amylase of 50.  Glucose 130, BUN 7, creatinine  0.7, sodium 113, potassium 4, chloride 76, CO2 26, calcium 9.2.  Total  protein 6.8, albumin 3.8, anion gap 14.0, total bili 1.2, ALP 89, AST 31,  ALT 35.  Hemoglobin 14.5, hematocrit 42.0, RBCs 4.77, MCV 88, MCH 34.6, RDW  12.0, platelets 297,000, WBC 11,300, neutrophils 68.3%, lymphocytes 22.4%.   IMPRESSION:  Dehydration with severe hyponatremia, status post pituitary  adenoma resection.   PLAN:  Admit to monitored bed.  Replace sodium STAT through normal saline  infusion.  Obtain laboratory work and administer hydrocortisone 50 mg IV.  Dr. Kevan Duke and associates to follow.      Brianna Duke, N.P.                     Brianna Duke, M.D.    TCL/MEDQ  D:  10/09/2003  T:  10/10/2003  Job:  [id number]

## 2010-09-23 NOTE — Op Note (Signed)
[place]  Patient:    Brianna Duke, Brianna Duke                       MRN: [id number] Proc. Date: 05/31/00 Adm. Date:  [id number] Disc. Date: [id number] Attending:  Sonda Primes                           Operative Report  PREOPERATIVE DIAGNOSIS:  Carcinoma of the left breast, status post lumpectomy with positive medial margin.  POSTOPERATIVE DIAGNOSIS:  Carcinoma of the left breast, status post lumpectomy with positive medial margin.  OPERATION PERFORMED:  Re-excision of lumpectomy site and sentinel lymph node biopsy followed by axillary lymph node dissection.  SURGEON:  Mardene Celeste. Lurene Shadow, M.D.  ANESTHESIA:  DESCRIPTION OF PROCEDURE:  Following the induction of anesthesia, the patient was positioned supinely and the left breast prepped and draped to be included in a sterile operative field.  The breast had been previously injected with both Lymphazurin blue and radionuclides. I made an elliptical incision around the old biopsy site and deepened this through the skin and subcutaneous tissues carrying dissection sharply more superior and more medially to encompass the close superior margin and the transected medial margin. Dissection was carried down to the chest wall and removed in its entirety and forwarded for pathologic evaluation.  Touch Prep on this specimen was negative.  Attention was then turned to the axilla.  Using the Neoprobe an area on the axilla was tested and noted to be hot.  A transverse incision was made in this area and deepened through the skin and subcutaneous tissues using the Neoprobe to trace my way to the sentinel node.  Sentinel node was noted to be hot and blue and was removed in its entirety and forwarded for pathologic evaluation.  Pathologic evaluation showed atypia and what appeared to be carcinoma in the sentinel node.  We then proceeded to do a left-sided axillary dissection.  I extended the axillary incision  both anteriorly and posteriorly and dissecting down from the anterior border of the latissimus dorsi, I went anteriorly to the pectoralis major muscle dissecting onto the pectoralis major and minor muscle.  The axillary vein was encountered and dissection started along the axillary vein maintaining hemostasis along the course of the vein with hemoclips.  The entire specimen was removed with protection of the long thoracic and thoracodorsal nerves.  This was forwarded for pathologic evaluation.  The axilla was then thoroughly irrigated with normal saline and a 10 mm Jackson-Pratt drain was placed within the axilla.  Next, the subcutaneous tissues were closed with interrupted 2-0 Vicryl suture and the skin closed with 5-0 Monocryl.  Breast wound was similarly closed with 2-0 Vicryl subcuticular stitches and a 5-0 Monocryl stitch reapproximating the skin.  Sponge, instrument and sharp counts were verified.  The Jackson-Pratt drain charged and sterile dressings placed on the wound.  Anesthetic reversed.  Patient removed from the operating room to the recovery room in stable condition having tolerated the procedure well. DD:  05/31/00 TD:  05/31/00 Job: [id number]

## 2011-02-01 LAB — DIFFERENTIAL
Basophils Relative: 1
Eosinophils Absolute: 0.2
Lymphs Abs: 4.4 — ABNORMAL HIGH
Neutro Abs: 4.8
Neutrophils Relative %: 48

## 2011-02-01 LAB — CBC
MCV: 92.2
Platelets: 185
RBC: 4.66
WBC: 9.9

## 2011-02-01 LAB — BASIC METABOLIC PANEL
BUN: 8
Calcium: 9.1
Chloride: 104
Creatinine, Ser: 0.78
GFR calc Af Amer: >60
GFR calc non Af Amer: >60

## 2011-09-19 DIAGNOSIS — F17200 Nicotine dependence, unspecified, uncomplicated: Secondary | ICD-10-CM | POA: Diagnosis not present

## 2011-09-19 DIAGNOSIS — F411 Generalized anxiety disorder: Secondary | ICD-10-CM | POA: Diagnosis not present

## 2011-09-19 DIAGNOSIS — J449 Chronic obstructive pulmonary disease, unspecified: Secondary | ICD-10-CM | POA: Diagnosis not present

## 2011-09-19 DIAGNOSIS — N182 Chronic kidney disease, stage 2 (mild): Secondary | ICD-10-CM | POA: Diagnosis not present

## 2011-09-19 DIAGNOSIS — E039 Hypothyroidism, unspecified: Secondary | ICD-10-CM | POA: Diagnosis not present

## 2011-09-19 DIAGNOSIS — C50919 Malignant neoplasm of unspecified site of unspecified female breast: Secondary | ICD-10-CM | POA: Diagnosis not present

## 2011-09-19 DIAGNOSIS — Z1331 Encounter for screening for depression: Secondary | ICD-10-CM | POA: Diagnosis not present

## 2011-09-19 DIAGNOSIS — E785 Hyperlipidemia, unspecified: Secondary | ICD-10-CM | POA: Diagnosis not present

## 2011-09-22 DIAGNOSIS — Z1211 Encounter for screening for malignant neoplasm of colon: Secondary | ICD-10-CM | POA: Diagnosis not present

## 2011-11-06 DIAGNOSIS — E785 Hyperlipidemia, unspecified: Secondary | ICD-10-CM | POA: Diagnosis not present

## 2012-03-21 DIAGNOSIS — S8990XA Unspecified injury of unspecified lower leg, initial encounter: Secondary | ICD-10-CM | POA: Diagnosis not present

## 2012-03-21 DIAGNOSIS — S6990XA Unspecified injury of unspecified wrist, hand and finger(s), initial encounter: Secondary | ICD-10-CM | POA: Diagnosis not present

## 2012-03-21 DIAGNOSIS — S9000XA Contusion of unspecified ankle, initial encounter: Secondary | ICD-10-CM | POA: Diagnosis not present

## 2012-03-21 DIAGNOSIS — S60229A Contusion of unspecified hand, initial encounter: Secondary | ICD-10-CM | POA: Diagnosis not present

## 2012-03-21 DIAGNOSIS — S99929A Unspecified injury of unspecified foot, initial encounter: Secondary | ICD-10-CM | POA: Diagnosis not present

## 2012-05-13 DIAGNOSIS — F17200 Nicotine dependence, unspecified, uncomplicated: Secondary | ICD-10-CM | POA: Diagnosis not present

## 2012-05-13 DIAGNOSIS — F411 Generalized anxiety disorder: Secondary | ICD-10-CM | POA: Diagnosis not present

## 2012-05-13 DIAGNOSIS — C50919 Malignant neoplasm of unspecified site of unspecified female breast: Secondary | ICD-10-CM | POA: Diagnosis not present

## 2012-05-13 DIAGNOSIS — E039 Hypothyroidism, unspecified: Secondary | ICD-10-CM | POA: Diagnosis not present

## 2012-11-13 DIAGNOSIS — E785 Hyperlipidemia, unspecified: Secondary | ICD-10-CM | POA: Diagnosis not present

## 2012-11-13 DIAGNOSIS — E039 Hypothyroidism, unspecified: Secondary | ICD-10-CM | POA: Diagnosis not present

## 2012-11-13 DIAGNOSIS — F17200 Nicotine dependence, unspecified, uncomplicated: Secondary | ICD-10-CM | POA: Diagnosis not present

## 2012-11-13 DIAGNOSIS — Z1331 Encounter for screening for depression: Secondary | ICD-10-CM | POA: Diagnosis not present

## 2012-11-13 DIAGNOSIS — C50919 Malignant neoplasm of unspecified site of unspecified female breast: Secondary | ICD-10-CM | POA: Diagnosis not present

## 2012-11-13 DIAGNOSIS — J449 Chronic obstructive pulmonary disease, unspecified: Secondary | ICD-10-CM | POA: Diagnosis not present

## 2012-11-13 DIAGNOSIS — Z23 Encounter for immunization: Secondary | ICD-10-CM | POA: Diagnosis not present

## 2012-11-18 DIAGNOSIS — J40 Bronchitis, not specified as acute or chronic: Secondary | ICD-10-CM | POA: Diagnosis not present

## 2012-12-03 DIAGNOSIS — J321 Chronic frontal sinusitis: Secondary | ICD-10-CM | POA: Diagnosis not present

## 2013-06-05 DIAGNOSIS — Z780 Asymptomatic menopausal state: Secondary | ICD-10-CM | POA: Diagnosis not present

## 2013-06-05 DIAGNOSIS — E039 Hypothyroidism, unspecified: Secondary | ICD-10-CM | POA: Diagnosis not present

## 2013-06-05 DIAGNOSIS — J449 Chronic obstructive pulmonary disease, unspecified: Secondary | ICD-10-CM | POA: Diagnosis not present

## 2013-06-05 DIAGNOSIS — C50919 Malignant neoplasm of unspecified site of unspecified female breast: Secondary | ICD-10-CM | POA: Diagnosis not present

## 2013-06-05 DIAGNOSIS — F17200 Nicotine dependence, unspecified, uncomplicated: Secondary | ICD-10-CM | POA: Diagnosis not present

## 2013-06-05 DIAGNOSIS — I10 Essential (primary) hypertension: Secondary | ICD-10-CM | POA: Diagnosis not present

## 2013-07-14 DIAGNOSIS — J9819 Other pulmonary collapse: Secondary | ICD-10-CM | POA: Diagnosis not present

## 2013-07-14 DIAGNOSIS — J90 Pleural effusion, not elsewhere classified: Secondary | ICD-10-CM | POA: Diagnosis not present

## 2013-07-14 DIAGNOSIS — J40 Bronchitis, not specified as acute or chronic: Secondary | ICD-10-CM | POA: Diagnosis not present

## 2013-07-16 DIAGNOSIS — J90 Pleural effusion, not elsewhere classified: Secondary | ICD-10-CM | POA: Diagnosis not present

## 2013-07-16 DIAGNOSIS — R911 Solitary pulmonary nodule: Secondary | ICD-10-CM | POA: Diagnosis not present

## 2013-07-16 DIAGNOSIS — J438 Other emphysema: Secondary | ICD-10-CM | POA: Diagnosis not present

## 2013-07-16 DIAGNOSIS — J9819 Other pulmonary collapse: Secondary | ICD-10-CM | POA: Diagnosis not present

## 2013-07-17 ENCOUNTER — Other Ambulatory Visit (HOSPITAL_COMMUNITY): Payer: Self-pay | Admitting: Internal Medicine

## 2013-07-17 DIAGNOSIS — J90 Pleural effusion, not elsewhere classified: Secondary | ICD-10-CM

## 2013-07-17 DIAGNOSIS — J40 Bronchitis, not specified as acute or chronic: Secondary | ICD-10-CM | POA: Diagnosis not present

## 2013-07-21 ENCOUNTER — Ambulatory Visit (HOSPITAL_COMMUNITY)
Admission: RE | Admit: 2013-07-21 | Discharge: 2013-07-21 | Disposition: A | Discharge status: Home / Self Care | Payer: Medicare Other | Source: Ambulatory Visit | Attending: Radiology | Admitting: Radiology

## 2013-07-21 ENCOUNTER — Ambulatory Visit (HOSPITAL_COMMUNITY)
Admission: RE | Admit: 2013-07-21 | Discharge: 2013-07-21 | Disposition: A | Discharge status: Home / Self Care | Payer: Medicare Other | Source: Ambulatory Visit | Attending: Internal Medicine | Admitting: Internal Medicine

## 2013-07-21 VITALS — BP 131/79

## 2013-07-21 DIAGNOSIS — J910 Malignant pleural effusion: Secondary | ICD-10-CM

## 2013-07-21 DIAGNOSIS — J90 Pleural effusion, not elsewhere classified: Secondary | ICD-10-CM | POA: Insufficient documentation

## 2013-07-21 DIAGNOSIS — C384 Malignant neoplasm of pleura: Secondary | ICD-10-CM | POA: Diagnosis not present

## 2013-07-21 HISTORY — DX: Malignant pleural effusion: J91.0

## 2013-07-21 LAB — BODY FLUID CELL COUNT WITH DIFFERENTIAL
EOS FL: 0 %
LYMPHS FL: 88 %
MONOCYTE-MACROPHAGE-SEROUS FLUID: 12 % — AB (ref 50–90)
Neutrophil Count, Fluid: 0 % (ref 0–25)
Other Cells, Fluid: 0 %
WBC FLUID: 1347 uL — AB (ref 0–1000)

## 2013-07-21 LAB — LACTATE DEHYDROGENASE, PLEURAL OR PERITONEAL FLUID: LD, Fluid: 152 U/L — ABNORMAL HIGH (ref 3–23)

## 2013-07-21 NOTE — Procedures (Signed)
US guided diagnostic/therapeutic left thoracentesis performed yielding 1.2 liters yellow fluid. The fluid was sent to the lab for preordered studies. F/u CXR pending. No immediate complications.

## 2013-07-23 DIAGNOSIS — C50919 Malignant neoplasm of unspecified site of unspecified female breast: Secondary | ICD-10-CM | POA: Diagnosis not present

## 2013-07-23 DIAGNOSIS — C801 Malignant (primary) neoplasm, unspecified: Secondary | ICD-10-CM | POA: Diagnosis not present

## 2013-07-23 DIAGNOSIS — J90 Pleural effusion, not elsewhere classified: Secondary | ICD-10-CM | POA: Diagnosis not present

## 2013-07-28 ENCOUNTER — Telehealth: Payer: Self-pay | Admitting: *Deleted

## 2013-07-28 NOTE — Telephone Encounter (Signed)
Called to schedule an appt w/ pt and she is not wanting to schedule the Med Onc appt at this time.  She has an appt w/ Dr. Valere Dross on 4/9 and she will speak with him and then go from there.  Called Lauren at referring to make her aware of pts wishes at this time.  Emailed Dr. Valere Dross to make him aware as well.  Took paperwork to HIM to scan.

## 2013-08-12 ENCOUNTER — Encounter: Payer: Self-pay | Admitting: Radiation Oncology

## 2013-08-12 NOTE — Progress Notes (Addendum)
Thoracic Location of Tumor / Histology:  Pleural space, left side  Patient presented 1 months ago with symptoms of: left neck nodule, lymph node enlargement CT scan showed moderately large left pleural effusion  Biopsies of pleural fluid, left space (if applicable) revealed:  6/73/41 Diagnosis PLEURAL FLUID, LEFT SPACE (SPECIMEN 1 OF 1, COLLECTED ON 07/21/13): MALIGNANT CELLS PRESENT, SEE COMMENT. COMMENT: THE MALIGNANT CELLS EXPRESS CYTOKERATIN 7 AND ESTROGEN RECEPTOR; AND DO NOT EXPRESS CALRETININ, GCDFP, AND ECADHERIN. THE IMMUNOPHENOTYPE IS CONSISTENT WITH METASTATIC MAMMARY CARCINOMA. THE CASE WAS REVIEWED WITH DR Lyndon Code, WHO CONCURS. BREAST PROGNOSTIC TUMOR STUDIES CAN BE PREFORMED UPON REQUEST.  Tobacco/Marijuana/Snuff/ETOH use: cigarettes 1 PPD x 30 years  Past/Anticipated interventions by cardiothoracic surgery, if any: none  Past/Anticipated interventions by medical oncology, if any: none at this time  Signs/Symptoms  Weight changes, if any:   Respiratory complaints, if any: COPD  Hemoptysis, if any: no  Pain issues, if any:  No  SAFETY ISSUES:  Prior radiation? YES- 2002  Left breast 5940 cGy , 2009 right breast 2880 cGy 16 fractions, right breast 2000 cGy 10 fractions, right breast boost 1400 cGy 7 fractions , total dose to tumor bed 6280 cGy  Pacemaker/ICD? no  Possible current pregnancy? no  Is the patient on methotrexate? no  Current Complaints / other details:  Divorced, G0P0, 1  sister breast ca living, surgery lives in [place], 1 sister ovarian cancer deceased

## 2013-08-14 ENCOUNTER — Encounter: Payer: Self-pay | Admitting: Radiation Oncology

## 2013-08-14 ENCOUNTER — Ambulatory Visit
Admission: RE | Admit: 2013-08-14 | Discharge: 2013-08-14 | Disposition: A | Discharge status: Home / Self Care | Payer: Medicare Other | Source: Ambulatory Visit | Attending: Radiation Oncology | Admitting: Radiation Oncology

## 2013-08-14 VITALS — BP 125/73 | HR 69 | Temp 97.4°F | Resp 20 | Ht 64.5 in | Wt 126.2 lb

## 2013-08-14 DIAGNOSIS — C50919 Malignant neoplasm of unspecified site of unspecified female breast: Secondary | ICD-10-CM | POA: Diagnosis not present

## 2013-08-14 DIAGNOSIS — Z170 Estrogen receptor positive status [ER+]: Secondary | ICD-10-CM | POA: Insufficient documentation

## 2013-08-14 DIAGNOSIS — J910 Malignant pleural effusion: Secondary | ICD-10-CM | POA: Diagnosis not present

## 2013-08-14 DIAGNOSIS — J449 Chronic obstructive pulmonary disease, unspecified: Secondary | ICD-10-CM | POA: Diagnosis not present

## 2013-08-14 DIAGNOSIS — F17200 Nicotine dependence, unspecified, uncomplicated: Secondary | ICD-10-CM | POA: Diagnosis not present

## 2013-08-14 DIAGNOSIS — F411 Generalized anxiety disorder: Secondary | ICD-10-CM | POA: Diagnosis not present

## 2013-08-14 DIAGNOSIS — Z79899 Other long term (current) drug therapy: Secondary | ICD-10-CM | POA: Insufficient documentation

## 2013-08-14 DIAGNOSIS — C787 Secondary malignant neoplasm of liver and intrahepatic bile duct: Secondary | ICD-10-CM | POA: Insufficient documentation

## 2013-08-14 DIAGNOSIS — C7970 Secondary malignant neoplasm of unspecified adrenal gland: Secondary | ICD-10-CM | POA: Insufficient documentation

## 2013-08-14 DIAGNOSIS — C7889 Secondary malignant neoplasm of other digestive organs: Secondary | ICD-10-CM | POA: Diagnosis not present

## 2013-08-14 DIAGNOSIS — J4489 Other specified chronic obstructive pulmonary disease: Secondary | ICD-10-CM | POA: Insufficient documentation

## 2013-08-14 DIAGNOSIS — Z923 Personal history of irradiation: Secondary | ICD-10-CM | POA: Diagnosis not present

## 2013-08-14 HISTORY — DX: Asymptomatic varicose veins of unspecified lower extremity: I83.90

## 2013-08-14 HISTORY — DX: Chronic kidney disease, unspecified: N18.9

## 2013-08-14 HISTORY — DX: Bronchitis, not specified as acute or chronic: J40

## 2013-08-14 HISTORY — DX: Insomnia, unspecified: G47.00

## 2013-08-14 HISTORY — DX: Essential (primary) hypertension: I10

## 2013-08-14 HISTORY — DX: Serous retinal detachment, unspecified eye: H33.20

## 2013-08-14 HISTORY — DX: Secondary malignant neoplasm of other digestive organs: C78.89

## 2013-08-14 HISTORY — DX: Unspecified cataract: H26.9

## 2013-08-14 HISTORY — DX: Anxiety disorder, unspecified: F41.9

## 2013-08-14 HISTORY — DX: Meniere's disease, unspecified ear: H81.09

## 2013-08-14 HISTORY — DX: Malignant pleural effusion: J91.0

## 2013-08-14 HISTORY — DX: Disorder of thyroid, unspecified: E07.9

## 2013-08-14 HISTORY — DX: Hyperlipidemia, unspecified: E78.5

## 2013-08-14 HISTORY — DX: Depression, unspecified: F32.A

## 2013-08-14 HISTORY — DX: Synovial cyst of popliteal space (Baker), unspecified knee: M71.20

## 2013-08-14 HISTORY — DX: Major depressive disorder, single episode, unspecified: F32.9

## 2013-08-14 HISTORY — DX: Chronic obstructive pulmonary disease, unspecified: J44.9

## 2013-08-14 HISTORY — DX: Unspecified malignant neoplasm of skin, unspecified: C44.90

## 2013-08-14 HISTORY — DX: Secondary malignant neoplasm of liver and intrahepatic bile duct: C78.7

## 2013-08-14 HISTORY — DX: Secondary malignant neoplasm of unspecified adrenal gland: C79.70

## 2013-08-14 HISTORY — DX: Personal history of irradiation: Z92.3

## 2013-08-14 HISTORY — DX: Malignant neoplasm of unspecified site of unspecified female breast: C50.919

## 2013-08-14 NOTE — Addendum Note (Signed)
Encounter addended by: Rexene Edison, MD on: 08/14/2013 10:20 AM<BR>     Documentation filed: Visit Diagnoses, Flowsheet VN, Orders

## 2013-08-14 NOTE — Progress Notes (Signed)
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 female who is seen today for the courtesy of Dr. [name] for evaluation of her stage IV metastatic carcinoma of breast. Her complete medical records are not available the time this dictation, but she received radiation therapy to her left breast (without nodal irradiation) under direction of Dr. [name] back in 2002. She declined nodal/supraclavicular radiation because of her concern about the risk for lymphedema. She was diagnosed with a right-sided breast cancer 2009 and underwent right breast radiation under the direction of Dr. Beola Cord. The patient tells me that she was on adjuvant tamoxifen up until the diagnosis of her right-sided breast cancer. Both left and right-sided breast cancers were estrogen receptor positive. To my knowledge she has not been on any adjuvant hormone therapy since 2009. More recently, she saw Dr. [name] for medial left supraclavicular/neck adenopathy and he obtained a chest x-ray and then a CT scan of chest on 07/16/2013 showing left supraclavicular adenopathy in addition to her left-sided pleural effusion moderately large. she was found to have retrocrural adenopathy and splenic lesions concerning for possible metastatic disease. The scan was performed through [facility] on [facility], here in [place]. The patient underwent a diagnostic thoracentesis on 07/21/2013 and she was found have malignant cells present which expressed cytokeratin 7 and estrogen receptor positivity, consistent with metastatic breast cancer. She was referred to "oncology", but the  patient insisted on seeing radiation oncology rather than medical oncology. She does report discomfort along her medial left supraclavicular region. She also reports mild dyspnea on exertion. Of note is that she is a cigarette smoker.    PREVIOUS RADIATION THERAPY: Yes, as described above.   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     PAST SURGICAL HISTORY:  Past Surgical History  Procedure Laterality Date  . Appendectomy      80 yrs old  . Pituitary surgery  2005    adenoma removed  . Thyroidectomy, partial  1991  . Tonsillectomy      age 66     FAMILY HISTORY: family history includes Cancer in her mother and sister; Cancer (age of onset: 71) in her sister.   SOCIAL HISTORY:  reports that she has been smoking Cigarettes.  She has a 30 pack-year smoking history. She does not have any smokeless tobacco history on file. She reports that she drinks alcohol. She reports that she does not use illicit drugs.   ALLERGIES: Review of patient's allergies indicates no known allergies.   MEDICATIONS:  Current Outpatient Prescriptions  Medication Sig Dispense Refill  . albuterol (PROVENTIL HFA;VENTOLIN HFA) 108 (90 BASE) MCG/ACT inhaler Inhale 2 puffs into the lungs every 6 (  six) hours as needed for wheezing or shortness of breath.      . levothyroxine (SYNTHROID, LEVOTHROID) 75 MCG tablet Take 75 mcg by mouth daily before breakfast.       . LORazepam (ATIVAN) 1 MG tablet Take 1 mg by mouth 2 (two) times daily as needed for anxiety.      . naproxen sodium (ANAPROX) 220 MG tablet Take 220 mg by mouth 2 (two) times daily with a meal.      . Omega-3 Fatty Acids (FISH OIL) 1000 MG CAPS Take 2 capsules by mouth daily.      . Vitamin D, Ergocalciferol, (DRISDOL) 50000 UNITS CAPS capsule Take 1,000 Units by mouth daily.       No current facility-administered medications for this encounter.     REVIEW OF SYSTEMS:  Pertinent items are noted in HPI.    PHYSICAL EXAM:  height is 5' 4.5" (1.638 m) and weight is 126 lb 3.2 oz (57.244 kg). Her oral temperature is 97.4 F (36.3 C). Her blood pressure is 125/73 and her pulse is 69. Her respiration is 20 and oxygen saturation is 98%.   Alert and oriented. Head and neck examination: Grossly unremarkable. Nodes: There is a 3-4 centimeter fixed mass along the medial left supraclavicular region which is tender to palpation. Lateral to this is a pea-sized firm nodule. Chest: There are diminished breath sounds along the lower fourth of her left hemithorax. Heart: Regular rate rhythm. Breasts: There is a significant volume defect along the inferior aspect of the left breast. No masses are appreciated. Right breast is without masses or lesions. Abdomen: Without hepatomegaly. Extremities: Without edema. Neurologic examination: Grossly nonfocal.   LABORATORY DATA:  Lab Results  Component Value Date   WBC 7.7 11/29/2007   HGB 14.2 11/29/2007   HCT 40.8 11/29/2007   MCV 90.2 11/29/2007   PLT 202 11/29/2007   Lab Results  Component Value Date   NA 138 09/26/2007   K 3.6 09/26/2007   CL 104 09/26/2007   CO2 24 09/26/2007   No results found for this basename: ALT, AST, GGT, ALKPHOS, BILITOT      IMPRESSION: The patient clearly has metastatic carcinoma breast with a malignant left pleural effusion along with adenopathy from the neck down to the retrocrural region. She may have splenic metastases as  well. As best I can tell, she was on tamoxifen for approximately 4 one half years after diagnosis of her first breast cancer. She does not recall the name of her medical oncologist. I explained to her that radiation therapy has notable in the management of her malignant effusion. I told that she may need to have repeat thoracentesis with possible referral to thoracic surgery if her effusion worsens. She may be a candidate for further antiestrogen therapy with an aromatase inhibitor after obtaining a baseline bone density study. I'm referring her to medical oncology for further evaluation and management.   PLAN: As discussed above.  I spent 45 minutes minutes face to face with the patient and more than 50% of that time was spent in counseling and/or coordination of care.

## 2013-08-15 ENCOUNTER — Telehealth: Payer: Self-pay | Admitting: *Deleted

## 2013-08-15 DIAGNOSIS — C50919 Malignant neoplasm of unspecified site of unspecified female breast: Secondary | ICD-10-CM

## 2013-08-15 NOTE — Telephone Encounter (Signed)
Called pt to schedule appt for med onc.  Confirmed new appt date and time 08/25/13 at 1230.  Pt relayed she preferred to fill out packet when she comes for visit.  Pt denies further needs at this time.

## 2013-08-22 ENCOUNTER — Encounter: Payer: Self-pay | Admitting: Specialist

## 2013-08-22 NOTE — Progress Notes (Signed)
Contacted patient because patient identified her distress as a "6" on the distress screen.  Patient was not available so chaplain left a message about support and the different numbers she could call to reach the chaplain and social workers.  Epifania Gore, [facility], PhD Chaplain

## 2013-08-25 ENCOUNTER — Other Ambulatory Visit (HOSPITAL_BASED_OUTPATIENT_CLINIC_OR_DEPARTMENT_OTHER): Payer: Medicare Other

## 2013-08-25 ENCOUNTER — Ambulatory Visit: Payer: Medicare Other

## 2013-08-25 ENCOUNTER — Encounter: Payer: Self-pay | Admitting: Oncology

## 2013-08-25 ENCOUNTER — Telehealth: Payer: Self-pay | Admitting: Oncology

## 2013-08-25 ENCOUNTER — Ambulatory Visit (HOSPITAL_BASED_OUTPATIENT_CLINIC_OR_DEPARTMENT_OTHER): Payer: Medicare Other | Admitting: Oncology

## 2013-08-25 VITALS — BP 125/77 | HR 81 | Temp 97.5°F | Resp 18 | Ht 64.0 in | Wt 126.4 lb

## 2013-08-25 DIAGNOSIS — C50919 Malignant neoplasm of unspecified site of unspecified female breast: Secondary | ICD-10-CM

## 2013-08-25 DIAGNOSIS — Z170 Estrogen receptor positive status [ER+]: Secondary | ICD-10-CM

## 2013-08-25 DIAGNOSIS — J910 Malignant pleural effusion: Secondary | ICD-10-CM

## 2013-08-25 LAB — COMPREHENSIVE METABOLIC PANEL (CC13)
ALK PHOS: 93 U/L (ref 40–150)
ALT: 8 U/L (ref 0–55)
AST: 20 U/L (ref 5–34)
Albumin: 3.6 g/dL (ref 3.5–5.0)
Anion Gap: 11 mEq/L (ref 3–11)
BILIRUBIN TOTAL: 0.32 mg/dL (ref 0.20–1.20)
BUN: 13.1 mg/dL (ref 7.0–26.0)
CO2: 23 mEq/L (ref 22–29)
CREATININE: 0.9 mg/dL (ref 0.6–1.1)
Calcium: 9.4 mg/dL (ref 8.4–10.4)
Chloride: 106 mEq/L (ref 98–109)
Glucose: 132 mg/dl (ref 70–140)
Potassium: 3.8 mEq/L (ref 3.5–5.1)
Sodium: 139 mEq/L (ref 136–145)
Total Protein: 6.5 g/dL (ref 6.4–8.3)

## 2013-08-25 LAB — CBC WITH DIFFERENTIAL/PLATELET
BASO%: 0.4 % (ref 0.0–2.0)
BASOS ABS: 0 10*3/uL (ref 0.0–0.1)
EOS%: 1.7 % (ref 0.0–7.0)
Eosinophils Absolute: 0.1 10*3/uL (ref 0.0–0.5)
HEMATOCRIT: 38.9 % (ref 34.8–46.6)
HGB: 13.2 g/dL (ref 11.6–15.9)
LYMPH#: 2.4 10*3/uL (ref 0.9–3.3)
LYMPH%: 29.6 % (ref 14.0–49.7)
MCH: 28.6 pg (ref 25.1–34.0)
MCHC: 33.9 g/dL (ref 31.5–36.0)
MCV: 84.4 fL (ref 79.5–101.0)
MONO#: 0.4 10*3/uL (ref 0.1–0.9)
MONO%: 5.3 % (ref 0.0–14.0)
NEUT#: 5.2 10*3/uL (ref 1.5–6.5)
NEUT%: 63 % (ref 38.4–76.8)
PLATELETS: 214 10*3/uL (ref 145–400)
RBC: 4.61 10*6/uL (ref 3.70–5.45)
RDW: 14.5 % (ref 11.2–14.5)
WBC: 8.2 10*3/uL (ref 3.9–10.3)

## 2013-08-25 NOTE — Telephone Encounter (Signed)
, °

## 2013-08-25 NOTE — Progress Notes (Signed)
Checked in new pt with no financial concerns. °

## 2013-08-25 NOTE — Patient Instructions (Signed)
We discussed the fact that you have most likely recurrent breast cancer  We will discuss doing scans including CT of the chest abdomen pelvis and PET scan  We discussed the possibility of eventually starting on antiestrogen pills since your tumor is estrogen receptor positive  I will plan on seeing you back in about 2-3 weeks' time in followup with scan results and then we will, with a definitive plan of care appear

## 2013-08-27 ENCOUNTER — Encounter: Payer: Self-pay | Admitting: *Deleted

## 2013-08-27 NOTE — Progress Notes (Signed)
[place] Work  Clinical Social Work was referred by Futures trader for assessment of psychosocial needs due to financial concerns.  Clinical Social Worker contacted patient at home to offer support and assess for needs.  Pt reports she has many financial concerns and is worried how much her scans will cost after her insurance. Pt thought this CSW had access to that information, but pt needs to talk with someone in billing or a financial advocate. CSW explained this to patient, but she was upset that CSW did not have access to this information. CSW referred pt to financial counseling for assistance. Pt stated understanding and now aware of CSW role and other supports CSW provides.     Clinical Social Work interventions: Problem solving  Resource education  Loren Racer, [place] Clinical Social Worker [facility] [place] Wednesday, Thursday and Friday Phone: [phone] Fax: [phone]

## 2013-08-29 ENCOUNTER — Encounter (HOSPITAL_COMMUNITY): Payer: Self-pay

## 2013-08-29 ENCOUNTER — Ambulatory Visit (HOSPITAL_COMMUNITY)
Admission: RE | Admit: 2013-08-29 | Discharge: 2013-08-29 | Disposition: A | Discharge status: Home / Self Care | Payer: Medicare Other | Source: Ambulatory Visit | Attending: Oncology | Admitting: Oncology

## 2013-08-29 ENCOUNTER — Encounter (HOSPITAL_COMMUNITY)
Admission: RE | Admit: 2013-08-29 | Discharge: 2013-08-29 | Disposition: A | Discharge status: Home / Self Care | Payer: Medicare Other | Source: Ambulatory Visit | Attending: Oncology | Admitting: Oncology

## 2013-08-29 DIAGNOSIS — C50919 Malignant neoplasm of unspecified site of unspecified female breast: Secondary | ICD-10-CM | POA: Insufficient documentation

## 2013-08-29 DIAGNOSIS — J910 Malignant pleural effusion: Secondary | ICD-10-CM | POA: Insufficient documentation

## 2013-08-29 DIAGNOSIS — R599 Enlarged lymph nodes, unspecified: Secondary | ICD-10-CM | POA: Insufficient documentation

## 2013-08-29 DIAGNOSIS — E279 Disorder of adrenal gland, unspecified: Secondary | ICD-10-CM | POA: Insufficient documentation

## 2013-08-29 DIAGNOSIS — C779 Secondary and unspecified malignant neoplasm of lymph node, unspecified: Secondary | ICD-10-CM | POA: Diagnosis not present

## 2013-08-29 LAB — GLUCOSE, CAPILLARY: Glucose-Capillary: 85 mg/dL (ref 70–99)

## 2013-08-29 MED ORDER — IOHEXOL 300 MG/ML  SOLN
80.0000 mL | Freq: Once | INTRAMUSCULAR | Status: AC | PRN
  Administered 2013-08-29: 80 mL via INTRAVENOUS

## 2013-08-29 MED ORDER — FLUDEOXYGLUCOSE F - 18 (FDG) INJECTION
7.7000 | Freq: Once | INTRAVENOUS | Status: AC | PRN
  Administered 2013-08-29: 7.7 via INTRAVENOUS

## 2013-09-02 ENCOUNTER — Telehealth: Payer: Self-pay | Admitting: *Deleted

## 2013-09-02 NOTE — Telephone Encounter (Signed)
Called pt with new appt date and time to see Dr. Jana Hakim d/t Dr. Humphrey Rolls on LOA.  Confirmed new appt date and time on 4/30 at 3:30.  Pt denies further needs at this time.

## 2013-09-04 ENCOUNTER — Ambulatory Visit (HOSPITAL_BASED_OUTPATIENT_CLINIC_OR_DEPARTMENT_OTHER): Payer: Medicare Other | Admitting: Oncology

## 2013-09-04 ENCOUNTER — Telehealth: Payer: Self-pay | Admitting: Oncology

## 2013-09-04 VITALS — BP 119/79 | HR 79 | Temp 97.7°F | Resp 20 | Ht 63.25 in | Wt 126.2 lb

## 2013-09-04 DIAGNOSIS — C773 Secondary and unspecified malignant neoplasm of axilla and upper limb lymph nodes: Secondary | ICD-10-CM

## 2013-09-04 DIAGNOSIS — C50912 Malignant neoplasm of unspecified site of left female breast: Secondary | ICD-10-CM

## 2013-09-04 DIAGNOSIS — J439 Emphysema, unspecified: Secondary | ICD-10-CM

## 2013-09-04 DIAGNOSIS — C50919 Malignant neoplasm of unspecified site of unspecified female breast: Secondary | ICD-10-CM | POA: Diagnosis not present

## 2013-09-04 DIAGNOSIS — Z720 Tobacco use: Secondary | ICD-10-CM

## 2013-09-04 DIAGNOSIS — C50911 Malignant neoplasm of unspecified site of right female breast: Secondary | ICD-10-CM

## 2013-09-04 DIAGNOSIS — C770 Secondary and unspecified malignant neoplasm of lymph nodes of head, face and neck: Secondary | ICD-10-CM | POA: Diagnosis not present

## 2013-09-04 DIAGNOSIS — Z5111 Encounter for antineoplastic chemotherapy: Secondary | ICD-10-CM

## 2013-09-04 DIAGNOSIS — Z170 Estrogen receptor positive status [ER+]: Secondary | ICD-10-CM

## 2013-09-04 DIAGNOSIS — J910 Malignant pleural effusion: Secondary | ICD-10-CM

## 2013-09-04 DIAGNOSIS — Z853 Personal history of malignant neoplasm of breast: Secondary | ICD-10-CM | POA: Diagnosis not present

## 2013-09-04 DIAGNOSIS — I700 Atherosclerosis of aorta: Secondary | ICD-10-CM

## 2013-09-04 MED ORDER — FULVESTRANT 250 MG/5ML IM SOLN
500.0000 mg | INTRAMUSCULAR | Status: DC
  Administered 2013-09-04: 500 mg via INTRAMUSCULAR
  Filled 2013-09-04: qty 10

## 2013-09-04 NOTE — Telephone Encounter (Signed)
, °

## 2013-09-05 ENCOUNTER — Ambulatory Visit (HOSPITAL_COMMUNITY)
Admission: RE | Admit: 2013-09-05 | Discharge: 2013-09-05 | Disposition: A | Discharge status: Home / Self Care | Payer: Medicare Other | Source: Ambulatory Visit | Attending: Oncology | Admitting: Oncology

## 2013-09-05 ENCOUNTER — Ambulatory Visit (HOSPITAL_COMMUNITY)
Admission: RE | Admit: 2013-09-05 | Discharge: 2013-09-05 | Disposition: A | Discharge status: Home / Self Care | Payer: Medicare Other | Source: Ambulatory Visit | Attending: Radiology | Admitting: Radiology

## 2013-09-05 ENCOUNTER — Telehealth: Payer: Self-pay | Admitting: *Deleted

## 2013-09-05 DIAGNOSIS — C801 Malignant (primary) neoplasm, unspecified: Secondary | ICD-10-CM | POA: Diagnosis not present

## 2013-09-05 DIAGNOSIS — C50919 Malignant neoplasm of unspecified site of unspecified female breast: Secondary | ICD-10-CM | POA: Insufficient documentation

## 2013-09-05 DIAGNOSIS — R0602 Shortness of breath: Secondary | ICD-10-CM | POA: Insufficient documentation

## 2013-09-05 DIAGNOSIS — K449 Diaphragmatic hernia without obstruction or gangrene: Secondary | ICD-10-CM | POA: Insufficient documentation

## 2013-09-05 DIAGNOSIS — J90 Pleural effusion, not elsewhere classified: Secondary | ICD-10-CM | POA: Insufficient documentation

## 2013-09-05 DIAGNOSIS — J910 Malignant pleural effusion: Secondary | ICD-10-CM

## 2013-09-05 NOTE — Telephone Encounter (Signed)
Pt left message on VM stating she went and had fluid drawn off her lung " and they got 1.2 quarts but told me they were not going to send it to lab for any test ".  " call me back and tell me when I need to come back in for my lungs because the only appointment I have is for the 14th at your office"  This RN returned call to patient and obtained VM.  Message left for a return call to this RN.

## 2013-09-05 NOTE — Procedures (Signed)
Successful US guided left thoracentesis. Yielded 1.2 liters of clear yellow fluid. Pt tolerated procedure well. No immediate complications.  Specimen was not sent for labs. CXR ordered.  Hedy Jacob PA-C 09/05/2013 11:12 AM

## 2013-09-06 DIAGNOSIS — C50911 Malignant neoplasm of unspecified site of right female breast: Secondary | ICD-10-CM | POA: Insufficient documentation

## 2013-09-06 DIAGNOSIS — J439 Emphysema, unspecified: Secondary | ICD-10-CM | POA: Insufficient documentation

## 2013-09-06 DIAGNOSIS — I700 Atherosclerosis of aorta: Secondary | ICD-10-CM | POA: Insufficient documentation

## 2013-09-06 DIAGNOSIS — C50912 Malignant neoplasm of unspecified site of left female breast: Secondary | ICD-10-CM | POA: Insufficient documentation

## 2013-09-06 DIAGNOSIS — Z720 Tobacco use: Secondary | ICD-10-CM | POA: Insufficient documentation

## 2013-09-06 NOTE — Progress Notes (Signed)
[place]  Telephone:([phone] Fax:(336) [phone]     ID: Brianna Duke OB: Dec 17, 1927  MR#: [id number]  XBM#:[id number]  PCP: [name], MD GYN:   SU:  OTHER MD: [name], james crossley  CHIEF COMPLAINT:  BREAST CANCER HISTORY: From [name] intake note dated 11/05/2007:  "She presented to Dr. [name] back in February for a complete physical exam.  She has a history of breast cancer treated back in 1992 by Dr. [name].  She had had a left lumpectomy by Dr. [name] for a reasonably large lesion, although I do not have any of the detail and she also had lymph nodes involved at that time, which I believe, 5 of 15 nodes were involved.  Pathology department no longer has this information.  She did have external beam radiation therapy for this.  We are awaiting that information as well.  For her complete physical, he recommended screening mammogram.  This was performed March 3 and it showed a possible mass noted in the right breast.  Spot compression views and possibly ultrasound were recommended for further evaluation.  Left breast was unremarkable.  She came back for that on March 27 and this showed that the question small mass in the right breast at the 12 o'clock position persisted measuring 4 cm from the nipple, a 0.76 x 0.9 cm hypoechoic mass correlating to the mammographic findings.  They recommended further workup.  On March 30, she had a core biopsy and this showed invasive ductal carcinoma ER/PR positive, HER-2/neu negative.  She went on to have an MRI of the bilateral breasts on April 9 and it showed a 1.1 x 0.8 x 0.5 known invasive ductal carcinoma at the 12 o'clock position of the right breast with an adjacent 3 mm possible satellite lesion immediately adjacent to the posteroinferior aspect of this mass.  There were multiple additional right breast nodules 4 mm or less in maximum diameter most likely representing normal background nodular  parenchyma.  None of these were large enough to warrant a biopsy at the time.  There was no background parenchymal nodularity or enhancement in the left breast possibly due to previous radiation.  There was asymmetrical and mildly prominent right axillary and retropectoral lymph nodes and a second look ultrasound of the right axilla was recommended.  She was given an ultrasound appointment at 11 a.m. on April 10; however, she called in and canceled that appointment saying she does not want to reschedule.  She told me that she had gone up to [place] and was considering not having any kind of treatment at all.  She had multiple small liver masses also noted on MRI, which were too small to accurately characterize on the current images, moderate-sized hiatal hernia.  She then finally returned to town and agreed to have surgery and chest x-ray on May 21, showed slight hyperinflation but no acute process seen.  She went on to have her lumpectomy and sentinel node biopsy by [name] on May 27.  This showed a 1.1 cm invasive ductal carcinoma grade 1 with low-grade DCIS with the DCIS margin being less than 0.1 cm in the lateral superficial resection margins.  Two sentinel nodes were negative.  [name] discussed with her the need for radiation and has sent her here for our opinion regarding the role of radiation.  He also discussed with her hormone receptor status being positive, but the patient declined taking any sort of medication or even having a medical  oncology referral."  The patient did complete radiation to the right breast 12/14/2007, with a boost to a total dose of 6280 cGy . She was subsequently lost to followup.  More recently the patient noted left sided supraclavicular adenopathy. She brought this to the attention of Dr. [name] who obtained a chest CT 07/16/2013 at [facility]. This showed left supraclavicular and anterior mediastinal adenopathy, a left-sided pleural effusion on with evidence of  left-sided pleural disease, a left adrenal mass and probable retroperitoneal adenopathy, several splenic lesions, some right retrocrural adenopathy, nonspecific liver lesions and also evidence of aortic atherosclerosis and emphysema. On 07/21/2013 the patient underwent left thoracentesis, in the pleural fluid showed ([id number]) malignant cells consistent with breast adenocarcinoma, estrogen receptor 35% positive, progesterone receptor 22% positive, with no HER-2 amplification, the signals ratio being 1.40 and the number per cell 1.75.  Her subsequent history is as detailed below.  INTERVAL HISTORY: Brianna Duke was evaluated in the breast clinic 09/04/2013.  REVIEW OF SYSTEMS: Considering the extent of disease as noted on PET scan and other staging studies, the patient's review of systems is remarkably benign. She admits to a dry cough, but no pleurisy, hemoptysis, and only mild to moderate shortness of breath. She sleeps poorly. She feels somewhat fatigued. She bruises easily. Sometimes she feels a little bit weak. She denies anxiety, or depression. She is entirely independent in all activities of daily living. She denies any unusual headaches, visual changes, dizziness, gait imbalance, nausea, or vomiting. A detailed review of systems today was otherwise noncontributory  PAST MEDICAL HISTORY: Past Medical History  Diagnosis Date  . Hyperlipidemia   . Bronchitis     history  . Retinal detachment     history of  . COPD (chronic obstructive pulmonary disease)   . Meniere's disease     surgery  . History of radiation therapy 06/15/00- 08/22/00    left breast, 5940 cGy 33 fractions  . Hx of radiation therapy 11/18/07- 01/02/08    right breast 2880 cGy 16 fractions, right breast 2000 cGy 10 fractions, right breast boost 1400 cGy 7 fractions  . Anxiety   . Varicose veins   . Hypertension   . Thyroid disease     hypothyroid  . Insomnia   . Chronic kidney disease     stage 2  . Breast cancer 05/31/00       left lower outer   . Malignant neoplasm of breast (female), unspecified site 10/02/07    right 12 o'clock , invasive ductal  . Skin cancer     squamous cell- face  . Baker's cyst of knee     right knee  . Metastases to the liver     breast primary  . 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 Metastasis to adrenal gland   . Pleural effusion, malignant 07/21/13    left lung, breast primary  . Cataract     surgery onboth  . Depression     PAST SURGICAL HISTORY: Past Surgical History  Procedure Laterality Date  . Appendectomy      80 yrs old  . Pituitary surgery  2005    adenoma removed  . Thyroidectomy, partial  1991  . Tonsillectomy      age 74    FAMILY HISTORY Family History  Problem Relation Age of Onset  . Cancer Mother   . Cancer Sister     ovarian  . Cancer Sister 28    breast   the patient's father died at the  age of 49 in a tractor accident. The patient is one of 9 siblings. Only at 2 siblings survive in addition to her. One is her brother Brianna Duke who lives in [place], and sister Brianna Duke who lives in [place].Marland KitchenHe can be reached at [phone].   GYNECOLOGIC HISTORY:  Menarche age 38. The patient is GX P0. 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 She moved to [place] from [place] around that time. She is single. She lives alone, with no pets. She attends the good [employer]. In addition to her siblings, listed above, she frequently visits her niece Brianna Duke in [place]. She can be reached at [phone], and her friend Brianna Duke who lives in [place].    ADVANCED DIRECTIVES: Not in place; on her 09/04/2013 visit the patient was given healthcare power of attorney and living will documents to complete and notarize at her discretion   HEALTH MAINTENANCE: History   Substance Use Topics  . Smoking status: Current Every Day Smoker -- 1.00 packs/day for 30 years    Types: Cigarettes  . Smokeless tobacco: Not on file  . Alcohol Use: Yes     Comment: rarely     Colonoscopy:  PAP:  Bone density:  Lipid panel:  No Known Allergies  Current Outpatient Prescriptions  Medication Sig Dispense Refill  . levothyroxine (SYNTHROID, LEVOTHROID) 75 MCG tablet Take 75 mcg by mouth daily before breakfast.      . LORazepam (ATIVAN) 1 MG tablet Take 1 mg by mouth 2 (two) times daily as needed for anxiety.      . naproxen sodium (ANAPROX) 220 MG tablet Take 220 mg by mouth 2 (two) times daily with a meal.      . Omega-3 Fatty Acids (FISH OIL) 1000 MG CAPS Take 2 capsules by mouth daily.      . Vitamin D, Ergocalciferol, (DRISDOL) 50000 UNITS CAPS capsule Take 1,000 Units by mouth daily.       Current Facility-Administered Medications  Medication Dose Route Frequency Provider Last Rate Last Dose  . fulvestrant (FASLODEX) injection 500 mg  500 mg Intramuscular Q14 Days Chauncey Cruel, MD   500 mg at 09/04/13 1632    OBJECTIVE: Older white woman who appears stated age 32 Vitals:   09/04/13 1522  BP: 119/79  Pulse: 79  Temp: 97.7 F (36.5 C)  Resp: 20     Body mass index is 22.17 kg/(m^2).    ECOG FS:1 - Symptomatic but completely ambulatory  Ocular: Sclerae unicteric, EOMs intact Ear-nose-throat: Oropharynx clear and moist Lymphatic: Left supraclavicular adenopathy measuring approximately 3 cm, firm and fixed Lungs no rales or rhonchi, g decreased breath sounds at the left base Heart regular rate and rhythm, no murmur appreciated Abd soft, nontender, positive bowel sounds MSK no focal spinal tenderness, no upper extremity lymphedema Neuro: non-focal, well-oriented, guarded affect  Breasts: Both breasts are status post lumpectomy and radiation. I do not see clear evidence of local recurrence. Both axillae are benign.   LAB RESULTS:  CMP      Component Value Date/Time   NA 139 08/25/2013 1238   NA 138 09/26/2007 1206   K 3.8 08/25/2013 1238   K 3.6 09/26/2007 1206   CL 104 09/26/2007 1206   CO2 23 08/25/2013 1238   CO2 24 09/26/2007 1206   GLUCOSE  132 08/25/2013 1238   GLUCOSE 118* 09/26/2007 1206   BUN 13.1 08/25/2013 1238   BUN 8 09/26/2007 1206   CREATININE 0.9 08/25/2013 1238   CREATININE 0.78 09/26/2007 1206   CALCIUM 9.4 08/25/2013 1238   CALCIUM 9.1 09/26/2007 1206   PROT 6.5 08/25/2013 1238   ALBUMIN 3.6 08/25/2013 1238   AST 20 08/25/2013 1238   ALT 8 08/25/2013 1238   ALKPHOS 93 08/25/2013 1238   BILITOT 0.32 08/25/2013 1238   GFRNONAA >60 09/26/2007 1206   GFRAA  Value: >60        The eGFR has been calculated using the MDRD equation. This calculation has not been validated in all clinical 09/26/2007 1206    I No results found for this basename: SPEP, UPEP,  kappa and lambda light chains    Lab Results  Component Value Date   WBC 8.2 08/25/2013   NEUTROABS 5.2 08/25/2013   HGB 13.2 08/25/2013   HCT 38.9 08/25/2013   MCV 84.4 08/25/2013   PLT 214 08/25/2013      Chemistry      Component Value Date/Time   NA 139 08/25/2013 1238   NA 138 09/26/2007 1206   K 3.8 08/25/2013 1238   K 3.6 09/26/2007 1206   CL 104 09/26/2007 1206   CO2 23 08/25/2013 1238   CO2 24 09/26/2007 1206   BUN 13.1 08/25/2013 1238   BUN 8 09/26/2007 1206   CREATININE 0.9 08/25/2013 1238   CREATININE 0.78 09/26/2007 1206      Component Value Date/Time   CALCIUM 9.4 08/25/2013 1238   CALCIUM 9.1 09/26/2007 1206   ALKPHOS 93 08/25/2013 1238   AST 20 08/25/2013 1238   ALT 8 08/25/2013 1238   BILITOT 0.32 08/25/2013 1238       No results found for this basename: LABCA2    No components found with this basename: LABCA125    No results found for this basename: INR,  in the last 168 hours  Urinalysis No results found for this basename: colorurine, appearanceur, labspec, phurine, glucoseu, hgbur, bilirubinur, ketonesur, proteinur, urobilinogen, nitrite,  leukocytesur    STUDIES: Dg Chest 1 View  09/05/2013   CLINICAL DATA:  post left sided thoracentesis 1.2 liters  EXAM: CHEST - 1 VIEW  COMPARISON:  US THORACENTESIS ASP PLEURAL SPACE W/IMG GUIDE dated 09/05/2013; DG CHEST 1 VIEW dated 07/21/2013; CT CHEST W/CM dated 08/29/2013  FINDINGS: The cardiac silhouette is within normal limits. There is no evidence of a pneumothorax status post thoracentesis. A small residual left effusion is appreciated. There is diffuse mild increased density in the left lung base may reflect a hypoventilation. The lungs otherwise clear. Surgical clips are appreciated within the left axilla. No acute osseous abnormalities. A moderate to large hiatal hernia is appreciated.  IMPRESSION: No evidence of a pneumothorax  Small residual left pleural effusion  Possibly hypoventilation within the left lung base   Electronically Signed   By: Margaree Mackintosh M.D.   On: 09/05/2013 12:07   Ct Chest W Contrast  08/29/2013   CLINICAL DATA:  Breast cancer. Malignant pleural effusion. Restaging.  EXAM: CT CHEST, ABDOMEN, AND PELVIS WITH CONTRAST  TECHNIQUE: Multidetector CT imaging of the chest, abdomen and pelvis was performed following the standard protocol during bolus administration of intravenous contrast.  CONTRAST:  38m OMNIPAQUE IOHEXOL 300 MG/ML  SOLN  COMPARISON:  None.  FINDINGS: CT CHEST FINDINGS  Large left pleural effusion. Areas of pleural enhancement noted posteriorly and medially with pleural thickening (see  image 32). Other areas of pleural thickening and enhancement noted as well. Masslike pleural thickening noted at the left base on image 57 and anteriorly on image 53.  Heart is normal size. Aorta is normal caliber. Extensive irregular plaque throughout the descending thoracic aorta. Moderate to large hiatal hernia.  Mildly enlarged right hilar lymph node measuring 11 mm in short axis diameter on image 24. Left internal mammary lymph node with central necrosis on image 29 has a short  axis diameter of 17 mm. There is adjacent anterior pleural thickening/enhancement on image 24. Left lower cervical/supraclavicular nodal mass on image 7 has a short axis diameter of 2.7 cm. Left axillary adenopathy noted. Index node has a short axis diameter of 14 mm on image 18.  Review of lung windows demonstrates compressive atelectasis in the left lower lobe. Triangular density noted at the left lung base anteriorly on image 48. Linear interstitial thickening noted anteriorly in the left upper lobe, likely postradiation changes. Mild underlying centrilobular emphysema. No suspicious pulmonary nodules.  No definite focal osseous lesion. Degenerative changes in the thoracic spine. Probable hemangiomas within the lower thoracic and upper lumbar vertebral bodies.  CT ABDOMEN AND PELVIS FINDINGS  Large left periaortic mass contiguous with the left adrenal gland, left diaphragmatic hiatus measuring 4.3 x 4.0 cm on image 61 of series 2. Right periaortic abnormal soft tissue represents retrocrural adenopathy on this image 62 measuring measuring 2.6 x 1.3 cm. Retrocrural adenopathy on image 54 measures 2.0 x 1.5 cm other mildly enlarged retroperitoneal lymph nodes.  Hypodensity anteriorly within the liver measures 15 mm in diameter and is nonspecific. Ill-defined/irregular hypodense, likely solid area within the spleen measures up to 4.8 cm on image 58. Other scattered smaller hypodense areas throughout the spleen. Pancreas, right adrenal, kidneys are unremarkable. Small cysts in the mid and lower pole of the left kidney. No hydronephrosis.  Stomach, small bowel unremarkable. Scattered descending colonic and sigmoid diverticulosis. Uterus, adnexae and urinary bladder unremarkable. No free fluid or free air.  Aneurysmal dilatation of the abdominal aorta measuring up to 3.2 cm. Irregular plaque throughout the abdominal aorta.  Trace free fluid in the pelvis.  Degenerative changes in the lumbar spine. Lucent areas with  coarsened trabeculae noted in the upper and mid lumbar spine, felt to most likely represent hemangiomas.  IMPRESSION: Large left pleural effusion with areas of pleural thickening and enhancement throughout the left hemithorax, likely pleural metastatic disease.  Adenopathy in the left internal mammary chain and left lower cervical region. Left axillary adenopathy. Right hilar adenopathy.  Mild underlying centrilobular emphysema.  Large hiatal hernia.  Retrocrural adenopathy and retroperitoneal adenopathy.  Large conglomerate mass in the region of the left adrenal gland and left periaortic region compatible with metastasis.  Large irregular hypodense dense area within the spleen with smaller scattered hypodense areas. Findings concerning for metastatic disease. 15 mm hypodensity anteriorly in the liver is nonspecific.  Lucent areas with coarsened trabeculae noted in the lower thoracic, upper and mid lumbar spine. Favor hemangiomas. These could be further evaluated with nuclear medicine whole-body bone scan.   Electronically Signed   By: Rolm Baptise M.D.   On: 08/29/2013 11:57   Nm Pet Image Restag (ps) Skull Base To Thigh  08/29/2013   CLINICAL DATA:  Subsequent treatment strategy for breast carcinoma. Malignant pleural effusion.  EXAM: NUCLEAR MEDICINE PET SKULL BASE TO THIGH  TECHNIQUE: 7.7 mCi F-18 FDG was injected intravenously. Full-ring PET imaging was performed from the skull base to thigh after  the radiotracer. CT data was obtained and used for attenuation correction and anatomic localization.  FASTING BLOOD GLUCOSE:  Value: 8 5 mg/dl  COMPARISON:  None available  FINDINGS: NECK  Hypermetabolic left supraclavicular lymph node measures 17 mm short axis with SUV max equals 6.5. Hypermetabolic high left axillary lymph node measures 16 mm short axis (image 23) of fused series) with SUV max equal 8.8. There is a hypermetabolic prevascular lymph node and left hilar lymph nodes. There is a large left pleural  effusion.  CHEST  No hypermetabolic mediastinal or hilar nodes. No suspicious pulmonary nodules on the CT scan.  ABDOMEN/PELVIS  There is a hypermetabolic left periaortic retroperitoneal mass measuring 4.3 x 3.2 cm with SUV max equal 8.6. This is likely a conglomerate of lymph nodes. Hypermetabolic retrocrural lymph nodes are also noted.  There is a hypermetabolic focus within the medial aspect of the spleen consistent metastasis (image 34). There small hypermetabolic periaortic lymph nodes just above the bifurcation. No hypermetabolic nodes in the iliac stations. Mild metabolic activity associated with a right inguinal lymph node  SKELETON  There are least 4 sites of hypermetabolic skeletal metastasis. Hypermetabolic focus in the proximal right humerus, image 10, with SUV max 3.9. There is hypermetabolic focus within the mid thoracic spine atT8 with SUV max 5.7. No clear lesion on CT portion. Intense metabolic lesion within the L3 vertebral body with a associated sclerotic lesion on CT and SUV max 9.4. Additional subtle metastasis within the right ischium.  IMPRESSION: 1. Hypermetabolic nodal metastasis in the left supraclavicular and left axillary nodal stations. 2. Hypermetabolic mediastinal and left hilar metastasis. 3. Bulky hypermetabolic left periaortic nodal mass. 4. Hypermetabolic solitary splenic metastasis. 5. Hypermetabolic skeletal metastasis involving the right humerus, thoracic spine, lumbar spine, and pelvis. 6. Large left effusion.   Electronically Signed   By: Suzy Bouchard M.D.   On: 08/29/2013 12:38   US Thoracentesis Asp Pleural Space W/img Guide  09/05/2013   CLINICAL DATA:  Metastatic breast cancer, left pleural effusion, shortness of breath, request for thoracentesis.  EXAM: ULTRASOUND GUIDED left THORACENTESIS  COMPARISON:  None.  PROCEDURE: An ultrasound guided thoracentesis was thoroughly discussed with the patient and questions answered. The benefits, risks, alternatives and  complications were also discussed. The patient understands and wishes to proceed with the procedure. Written consent was obtained.  Ultrasound was performed to localize and mark an adequate pocket of fluid in the left chest. The area was then prepped and draped in the normal sterile fashion. 1% Lidocaine was used for local anesthesia. Under ultrasound guidance a 19 gauge Yueh catheter was introduced. Thoracentesis was performed. The catheter was removed and a dressing applied.  Complications:  None.  FINDINGS: A total of approximately 1.2 liters of clear yellow fluid was removed. A fluid sample was not sent for laboratory analysis.  IMPRESSION: Successful ultrasound guided left thoracentesis yielding 1.2 liters of pleural fluid.  Read By:  Tsosie Billing PA-C   Electronically Signed   By: Aletta Edouard M.D.   On: 09/05/2013 11:26    ASSESSMENT: 80 y.o. Brianna Duke woman with a history of bilateral breast cancer, now stage IV, as follows  (1) LEFT BREAST:  (a) status post left excisional biopsy 05/14/2000 for a pT2 NX, stage II invasive lobular breast cancer, estrogen receptor 83% positive, progesterone receptor 90% positive, HER-2 negative, with positive margins   (b) left breast reexcision for margin clearance, with left axillary lymph node dissection 05/31/2000, with negative margins but 5/15 axillary lymph nodes  involved, final stage pT2 pN2, stage IIIA  (c) status post radiation to the left breast (not to axilla)  (d) on tamoxifen approximately 5 years per patient report  (2) RIGHT BREAST:  (a) status post right lumpectomy and sentinel lymph node sampling 05/272009 for a pT1c pN0, stage IA invasive ductal carcinoma, grade 1, estrogen receptor 99% positive, progesterone receptor and 98% positive, with an MIB-1 of 11%, and no HER-2 amplification.  (b) status post radiation to the right breast completed August 2009  (c) the patient refused antiestrogen therapy and medical oncology evaluation  (3)  METASTATIC DISEASE:  (a) staging studies March and April 2015 show extensive metastatic adenopathy (left supraclavicular and left axillary,.  retrocrural, etc.), with splenic involvement, and with significant left pleural involvement and a large left pleural effusion; bone involvement was questionable  (b) left thoracentesis 07/21/2013 confirms a malignant effusion, with adenocarcinoma cells positive for estrogen and progesterone receptor, negative for HER-2 amplification  (c) fulvestrant started 09/04/2013  PLAN: We spent the better part of today's hour-plus appointment discussing the biology of breast cancer in general, and the specifics of the patient's tumor in particular. Jaclyne understands she now has stage IV breast cancer, likely lobular, from her original left breast cancer 13 years ago. This is not a curable disease. It is however very treatable, and I am hopeful the tumor will respond to anti-estrogens. I did encourage the patient to complete a healthcare power of attorney and living will document and we will continue to discuss advanced directives as appropriate at subsequent visits.  We then discussed various options including anastrozole and fulvestrant. The patient is a good understanding of the possible toxicities, side effects and complications of these agents. After much back and forth, with the patient expressing at times the doubt that perhaps we were merely experimenting on her, she understood that the fulvestrant is standard of care in this setting and agreed to the first treatment.  She will return in 2 weeks for a booster dose and then 2 weeks after that for her next monthly dose. We will follow the palpable lesion in the left supraclavicular area for response, understanding that fulvestrant may take several months to show effects. At the next visit we will also discuss obtaining a plain film of her right humerus to assess for bony disease.  Brianna Duke has had good understanding of the  overall plan. She agrees with it. She knows a goal of treatment in her case is control. She will call with any problems that may develop before her next visit here.    Chauncey Cruel, MD   09/06/2013 9:05 AM

## 2013-09-10 ENCOUNTER — Telehealth: Payer: Self-pay | Admitting: Oncology

## 2013-09-17 ENCOUNTER — Other Ambulatory Visit: Payer: Self-pay | Admitting: Oncology

## 2013-09-18 ENCOUNTER — Other Ambulatory Visit (HOSPITAL_BASED_OUTPATIENT_CLINIC_OR_DEPARTMENT_OTHER): Payer: Medicare Other

## 2013-09-18 ENCOUNTER — Ambulatory Visit (HOSPITAL_BASED_OUTPATIENT_CLINIC_OR_DEPARTMENT_OTHER): Payer: Medicare Other | Admitting: Adult Health

## 2013-09-18 ENCOUNTER — Ambulatory Visit (HOSPITAL_BASED_OUTPATIENT_CLINIC_OR_DEPARTMENT_OTHER): Payer: Medicare Other

## 2013-09-18 ENCOUNTER — Encounter: Payer: Self-pay | Admitting: Adult Health

## 2013-09-18 VITALS — BP 115/74 | HR 69 | Temp 97.5°F | Resp 19 | Ht 63.25 in | Wt 125.9 lb

## 2013-09-18 VITALS — BP 106/94 | HR 68 | Temp 97.2°F

## 2013-09-18 DIAGNOSIS — C50919 Malignant neoplasm of unspecified site of unspecified female breast: Secondary | ICD-10-CM

## 2013-09-18 DIAGNOSIS — C773 Secondary and unspecified malignant neoplasm of axilla and upper limb lymph nodes: Secondary | ICD-10-CM

## 2013-09-18 DIAGNOSIS — Z170 Estrogen receptor positive status [ER+]: Secondary | ICD-10-CM | POA: Diagnosis not present

## 2013-09-18 DIAGNOSIS — J910 Malignant pleural effusion: Secondary | ICD-10-CM

## 2013-09-18 DIAGNOSIS — C50911 Malignant neoplasm of unspecified site of right female breast: Secondary | ICD-10-CM

## 2013-09-18 DIAGNOSIS — Z5111 Encounter for antineoplastic chemotherapy: Secondary | ICD-10-CM | POA: Diagnosis not present

## 2013-09-18 DIAGNOSIS — C50912 Malignant neoplasm of unspecified site of left female breast: Secondary | ICD-10-CM

## 2013-09-18 DIAGNOSIS — Z853 Personal history of malignant neoplasm of breast: Secondary | ICD-10-CM

## 2013-09-18 LAB — COMPREHENSIVE METABOLIC PANEL (CC13)
ALBUMIN: 3.4 g/dL — AB (ref 3.5–5.0)
ALT: 13 U/L (ref 0–55)
ANION GAP: 12 meq/L — AB (ref 3–11)
AST: 16 U/L (ref 5–34)
Alkaline Phosphatase: 89 U/L (ref 40–150)
BUN: 14.2 mg/dL (ref 7.0–26.0)
CALCIUM: 9.2 mg/dL (ref 8.4–10.4)
CHLORIDE: 106 meq/L (ref 98–109)
CO2: 25 meq/L (ref 22–29)
CREATININE: 0.9 mg/dL (ref 0.6–1.1)
GLUCOSE: 95 mg/dL (ref 70–140)
Potassium: 3.8 mEq/L (ref 3.5–5.1)
Sodium: 143 mEq/L (ref 136–145)
Total Bilirubin: 0.3 mg/dL (ref 0.20–1.20)
Total Protein: 6.1 g/dL — ABNORMAL LOW (ref 6.4–8.3)

## 2013-09-18 LAB — CBC WITH DIFFERENTIAL/PLATELET
BASO%: 0.4 % (ref 0.0–2.0)
BASOS ABS: 0 10*3/uL (ref 0.0–0.1)
EOS%: 3.3 % (ref 0.0–7.0)
Eosinophils Absolute: 0.2 10*3/uL (ref 0.0–0.5)
HEMATOCRIT: 40.2 % (ref 34.8–46.6)
HEMOGLOBIN: 13.1 g/dL (ref 11.6–15.9)
LYMPH%: 23.3 % (ref 14.0–49.7)
MCH: 27.9 pg (ref 25.1–34.0)
MCHC: 32.6 g/dL (ref 31.5–36.0)
MCV: 85.5 fL (ref 79.5–101.0)
MONO#: 0.4 10*3/uL (ref 0.1–0.9)
MONO%: 5.7 % (ref 0.0–14.0)
NEUT#: 5 10*3/uL (ref 1.5–6.5)
NEUT%: 67.3 % (ref 38.4–76.8)
Platelets: 208 10*3/uL (ref 145–400)
RBC: 4.7 10*6/uL (ref 3.70–5.45)
RDW: 14.7 % — AB (ref 11.2–14.5)
WBC: 7.4 10*3/uL (ref 3.9–10.3)
lymph#: 1.7 10*3/uL (ref 0.9–3.3)

## 2013-09-18 MED ORDER — FULVESTRANT 250 MG/5ML IM SOLN
500.0000 mg | INTRAMUSCULAR | Status: DC
  Administered 2013-09-18: 500 mg via INTRAMUSCULAR
  Filled 2013-09-18: qty 10

## 2013-09-18 NOTE — Progress Notes (Signed)
[place]  Telephone:(336) [phone] Fax:(336) [phone]     ID: Brianna Duke OB: 27-Aug-1927  MR#: [id number]  TFT#:[id number]  PCP: [name], MD GYN:   SU:  OTHER MD: [name], james crossley  CHIEF COMPLAINT:  BREAST CANCER HISTORY: From [name] intake note dated 11/05/2007:  "She presented to Dr. [name] back in February for a complete physical exam.  She has a history of breast cancer treated back in 1992 by Dr. [name].  She had had a left lumpectomy by Dr. [name] for a reasonably large lesion, although I do not have any of the detail and she also had lymph nodes involved at that time, which I believe, 5 of 15 nodes were involved.  Pathology department no longer has this information.  She did have external beam radiation therapy for this.  We are awaiting that information as well.  For her complete physical, he recommended screening mammogram.  This was performed March 3 and it showed a possible mass noted in the right breast.  Spot compression views and possibly ultrasound were recommended for further evaluation.  Left breast was unremarkable.  She came back for that on March 27 and this showed that the question small mass in the right breast at the 12 o'clock position persisted measuring 4 cm from the nipple, a 0.76 x 0.9 cm hypoechoic mass correlating to the mammographic findings.  They recommended further workup.  On March 30, she had a core biopsy and this showed invasive ductal carcinoma ER/PR positive, HER-2/neu negative.  She went on to have an MRI of the bilateral breasts on April 9 and it showed a 1.1 x 0.8 x 0.5 known invasive ductal carcinoma at the 12 o'clock position of the right breast with an adjacent 3 mm possible satellite lesion immediately adjacent to the posteroinferior aspect of this mass.  There were multiple additional right breast nodules 4 mm or less in maximum diameter most likely representing normal background nodular  parenchyma.  None of these were large enough to warrant a biopsy at the time.  There was no background parenchymal nodularity or enhancement in the left breast possibly due to previous radiation.  There was asymmetrical and mildly prominent right axillary and retropectoral lymph nodes and a second look ultrasound of the right axilla was recommended.  She was given an ultrasound appointment at 11 a.m. on April 10; however, she called in and canceled that appointment saying she does not want to reschedule.  She told me that she had gone up to [place] and was considering not having any kind of treatment at all.  She had multiple small liver masses also noted on MRI, which were too small to accurately characterize on the current images, moderate-sized hiatal hernia.  She then finally returned to town and agreed to have surgery and chest x-ray on May 21, showed slight hyperinflation but no acute process seen.  She went on to have her lumpectomy and sentinel node biopsy by [name] on May 27.  This showed a 1.1 cm invasive ductal carcinoma grade 1 with low-grade DCIS with the DCIS margin being less than 0.1 cm in the lateral superficial resection margins.  Two sentinel nodes were negative.  [name] discussed with her the need for radiation and has sent her here for our opinion regarding the role of radiation.  He also discussed with her hormone receptor status being positive, but the patient declined taking any sort of medication or even having a medical  oncology referral."  The patient did complete radiation to the right breast 12/14/2007, with a boost to a total dose of 6280 cGy . She was subsequently lost to followup.  More recently the patient noted left sided supraclavicular adenopathy. She brought this to the attention of Dr. [name] who obtained a chest CT 07/16/2013 at [facility]. This showed left supraclavicular and anterior mediastinal adenopathy, a left-sided pleural effusion on with evidence of  left-sided pleural disease, a left adrenal mass and probable retroperitoneal adenopathy, several splenic lesions, some right retrocrural adenopathy, nonspecific liver lesions and also evidence of aortic atherosclerosis and emphysema. On 07/21/2013 the patient underwent left thoracentesis, in the pleural fluid showed ([id number]) malignant cells consistent with breast adenocarcinoma, estrogen receptor 35% positive, progesterone receptor 22% positive, with no HER-2 amplification, the signals ratio being 1.40 and the number per cell 1.75.  Her subsequent history is as detailed below.  INTERVAL HISTORY: Brianna Duke is here today for evaluation after receiving her first Faslodex injection.  She is accompanied by Brianna Duke, Brianna Duke's friend.  She tolerated the first injection pretty well.  Her main complainShe denies hot flashes, joint aches, pain, fevers, chills, vaginal dryness/dry eyes, nausea/vomiting, constipation.  She does say that she has very mild dyspnea on exertion, that isn't worse.  She doesn't sleep and says that she does this on purpose because she enjoys staying up and watching movies on the television.  She does take Lorazepam to help her sleep if needed.  She is pacing the room and argumentative with her friend Brianna Duke about her review of systems that I went through.  Her friend says that she has been having some mild occasional nausea and constipation, however the patient says that this is not the case.  Her friend also says that she is having more difficulty than necessary with sleeping.  She denies any bone pain.    REVIEW OF SYSTEMS: A 10 point review of systems was conducted and is otherwise negative except for what is noted above.     PAST MEDICAL HISTORY: Past Medical History  Diagnosis Date  . Hyperlipidemia   . Bronchitis     history  . Retinal detachment     history of  . COPD (chronic obstructive pulmonary disease)   . Meniere's disease     surgery  . History of radiation therapy  06/15/00- 08/22/00    left breast, 5940 cGy 33 fractions  . Hx of radiation therapy 11/18/07- 01/02/08    right breast 2880 cGy 16 fractions, right breast 2000 cGy 10 fractions, right breast boost 1400 cGy 7 fractions  . Anxiety   . Varicose veins   . Hypertension   . Thyroid disease     hypothyroid  . Insomnia   . Chronic kidney disease     stage 2  . Breast cancer 05/31/00     left lower outer   . Malignant neoplasm of breast (female), unspecified site 10/02/07    right 12 o'clock , invasive ductal  . Skin cancer     squamous cell- face  . Baker's cyst of knee     right knee  . Metastases to the liver     breast primary  . 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 Metastasis to adrenal gland   . Pleural effusion, malignant 07/21/13    left lung, breast primary  . Cataract     surgery onboth  . Depression     PAST SURGICAL HISTORY: Past Surgical History  Procedure Laterality Date  .  Appendectomy      80 yrs old  . Pituitary surgery  2005    adenoma removed  . Thyroidectomy, partial  1991  . Tonsillectomy      age 49    FAMILY HISTORY Family History  Problem Relation Age of Onset  . Cancer Mother   . Cancer Sister     ovarian  . Cancer Sister 51    breast   the patient's father died at the age of 73 in a tractor accident. The patient is one of 9 siblings. Only at 2 siblings survive in addition to her. One is her brother Carmin Muskrat who lives in [place], and sister Alisia Ferrari who lives in [place].Marland KitchenHe can be reached at [phone].   GYNECOLOGIC HISTORY:  Menarche age 89. The patient is GX P0. 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 She moved to [place] from [place] around that time. She is single. She lives alone, with no pets. She attends the good [employer]. In addition to her  siblings, listed above, she frequently visits her niece Brianna Duke in [place]. She can be reached at [phone], and her friend Brianna Duke who lives in [place].    ADVANCED DIRECTIVES: Not in place; on her 09/04/2013 visit the patient was given healthcare power of attorney and living will documents to complete and notarize at her discretion   HEALTH MAINTENANCE: History  Substance Use Topics  . Smoking status: Current Every Day Smoker -- 1.00 packs/day for 30 years    Types: Cigarettes  . Smokeless tobacco: Not on file  . Alcohol Use: Yes     Comment: rarely     Colonoscopy:  PAP:  Bone density:  Lipid panel:  No Known Allergies  Current Outpatient Prescriptions  Medication Sig Dispense Refill  . levothyroxine (SYNTHROID, LEVOTHROID) 75 MCG tablet Take 75 mcg by mouth daily before breakfast.      . LORazepam (ATIVAN) 1 MG tablet Take 1 mg by mouth 2 (two) times daily as needed for anxiety.      . naproxen sodium (ANAPROX) 220 MG tablet Take 220 mg by mouth 2 (two) times daily with a meal.      . Omega-3 Fatty Acids (FISH OIL) 1000 MG CAPS Take 2 capsules by mouth daily.      . Vitamin D, Ergocalciferol, (DRISDOL) 50000 UNITS CAPS capsule Take 1,000 Units by mouth daily.       No current facility-administered medications for this visit.    OBJECTIVE: Older white woman who appears stated age 10 Vitals:   09/18/13 1048  BP: 115/74  Pulse: 69  Temp: 97.5 F (36.4 C)  Resp: 19     Body mass index is 22.11 kg/(m^2).     GENERAL: Patient is a chronically ill appearing older female in no acute distress HEENT:  Sclerae anicteric.  Oropharynx clear and moist. No ulcerations or evidence of oropharyngeal candidiasis. Neck is supple.  NODES:  No cervical, supraclavicular, or axillary lymphadenopathy palpated.  BREAST EXAM: s/p left lumpectomy, no nodularity, left breast and nipple does point downward and nipple is touching abdomen.   LUNGS:  Clear to auscultation bilaterally.   No wheezes or rhonchi. HEART:  Regular rate and rhythm. No murmur appreciated. ABDOMEN:  Soft, nontender.  Positive, normoactive bowel sounds. No organomegaly palpated. MSK:  No focal  spinal tenderness to palpation. Full range of motion bilaterally in the upper extremities. EXTREMITIES:  No peripheral edema.   SKIN:  Clear with no obvious rashes or skin changes. No nail dyscrasia. NEURO:  Nonfocal. Well oriented.  Appropriate affect. ECOG FS:1 - Symptomatic but completely ambulatory     LAB RESULTS:  CMP     Component Value Date/Time   NA 143 09/18/2013 1037   NA 138 09/26/2007 1206   K 3.8 09/18/2013 1037   K 3.6 09/26/2007 1206   CL 104 09/26/2007 1206   CO2 25 09/18/2013 1037   CO2 24 09/26/2007 1206   GLUCOSE 95 09/18/2013 1037   GLUCOSE 118* 09/26/2007 1206   BUN 14.2 09/18/2013 1037   BUN 8 09/26/2007 1206   CREATININE 0.9 09/18/2013 1037   CREATININE 0.78 09/26/2007 1206   CALCIUM 9.2 09/18/2013 1037   CALCIUM 9.1 09/26/2007 1206   PROT 6.1* 09/18/2013 1037   ALBUMIN 3.4* 09/18/2013 1037   AST 16 09/18/2013 1037   ALT 13 09/18/2013 1037   ALKPHOS 89 09/18/2013 1037   BILITOT 0.30 09/18/2013 1037   GFRNONAA >60 09/26/2007 1206   GFRAA  Value: >60        The eGFR has been calculated using the MDRD equation. This calculation has not been validated in all clinical 09/26/2007 1206    I No results found for this basename: SPEP,  UPEP,   kappa and lambda light chains    Lab Results  Component Value Date   WBC 7.4 09/18/2013   NEUTROABS 5.0 09/18/2013   HGB 13.1 09/18/2013   HCT 40.2 09/18/2013   MCV 85.5 09/18/2013   PLT 208 09/18/2013      Chemistry      Component Value Date/Time   NA 143 09/18/2013 1037   NA 138 09/26/2007 1206   K 3.8 09/18/2013 1037   K 3.6 09/26/2007 1206   CL 104 09/26/2007 1206   CO2 25 09/18/2013 1037   CO2 24 09/26/2007 1206   BUN 14.2 09/18/2013 1037   BUN 8 09/26/2007 1206   CREATININE 0.9 09/18/2013 1037   CREATININE 0.78 09/26/2007 1206      Component  Value Date/Time   CALCIUM 9.2 09/18/2013 1037   CALCIUM 9.1 09/26/2007 1206   ALKPHOS 89 09/18/2013 1037   AST 16 09/18/2013 1037   ALT 13 09/18/2013 1037   BILITOT 0.30 09/18/2013 1037       No results found for this basename: LABCA2    No components found with this basename: LABCA125    No results found for this basename: INR,  in the last 168 hours  Urinalysis No results found for this basename: colorurine,  appearanceur,  labspec,  phurine,  glucoseu,  hgbur,  bilirubinur,  ketonesur,  proteinur,  urobilinogen,  nitrite,  leukocytesur    STUDIES: Dg Chest 1 View  09/05/2013   CLINICAL DATA:  post left sided thoracentesis 1.2 liters  EXAM: CHEST - 1 VIEW  COMPARISON:  US THORACENTESIS ASP PLEURAL SPACE W/IMG GUIDE dated 09/05/2013; DG CHEST 1 VIEW dated 07/21/2013; CT CHEST W/CM dated 08/29/2013  FINDINGS: The cardiac silhouette is within normal limits. There is no evidence of a pneumothorax status post thoracentesis. A small residual left effusion is appreciated. There is diffuse mild increased density in the left lung base may reflect a hypoventilation. The lungs otherwise clear. Surgical clips are appreciated within the left axilla. No acute osseous abnormalities. A moderate to large hiatal hernia is appreciated.  IMPRESSION: No  evidence of a pneumothorax  Small residual left pleural effusion  Possibly hypoventilation within the left lung base   Electronically Signed   By: Margaree Mackintosh M.D.   On: 09/05/2013 12:07   Ct Chest W Contrast  08/29/2013   CLINICAL DATA:  Breast cancer. Malignant pleural effusion. Restaging.  EXAM: CT CHEST, ABDOMEN, AND PELVIS WITH CONTRAST  TECHNIQUE: Multidetector CT imaging of the chest, abdomen and pelvis was performed following the standard protocol during bolus administration of intravenous contrast.  CONTRAST:  36m OMNIPAQUE IOHEXOL 300 MG/ML  SOLN  COMPARISON:  None.  FINDINGS: CT CHEST FINDINGS  Large left pleural effusion. Areas of pleural enhancement  noted posteriorly and medially with pleural thickening (see image 32). Other areas of pleural thickening and enhancement noted as well. Masslike pleural thickening noted at the left base on image 57 and anteriorly on image 53.  Heart is normal size. Aorta is normal caliber. Extensive irregular plaque throughout the descending thoracic aorta. Moderate to large hiatal hernia.  Mildly enlarged right hilar lymph node measuring 11 mm in short axis diameter on image 24. Left internal mammary lymph node with central necrosis on image 29 has a short axis diameter of 17 mm. There is adjacent anterior pleural thickening/enhancement on image 24. Left lower cervical/supraclavicular nodal mass on image 7 has a short axis diameter of 2.7 cm. Left axillary adenopathy noted. Index node has a short axis diameter of 14 mm on image 18.  Review of lung windows demonstrates compressive atelectasis in the left lower lobe. Triangular density noted at the left lung base anteriorly on image 48. Linear interstitial thickening noted anteriorly in the left upper lobe, likely postradiation changes. Mild underlying centrilobular emphysema. No suspicious pulmonary nodules.  No definite focal osseous lesion. Degenerative changes in the thoracic spine. Probable hemangiomas within the lower thoracic and upper lumbar vertebral bodies.  CT ABDOMEN AND PELVIS FINDINGS  Large left periaortic mass contiguous with the left adrenal gland, left diaphragmatic hiatus measuring 4.3 x 4.0 cm on image 61 of series 2. Right periaortic abnormal soft tissue represents retrocrural adenopathy on this image 62 measuring measuring 2.6 x 1.3 cm. Retrocrural adenopathy on image 54 measures 2.0 x 1.5 cm other mildly enlarged retroperitoneal lymph nodes.  Hypodensity anteriorly within the liver measures 15 mm in diameter and is nonspecific. Ill-defined/irregular hypodense, likely solid area within the spleen measures up to 4.8 cm on image 58. Other scattered smaller  hypodense areas throughout the spleen. Pancreas, right adrenal, kidneys are unremarkable. Small cysts in the mid and lower pole of the left kidney. No hydronephrosis.  Stomach, small bowel unremarkable. Scattered descending colonic and sigmoid diverticulosis. Uterus, adnexae and urinary bladder unremarkable. No free fluid or free air.  Aneurysmal dilatation of the abdominal aorta measuring up to 3.2 cm. Irregular plaque throughout the abdominal aorta.  Trace free fluid in the pelvis.  Degenerative changes in the lumbar spine. Lucent areas with coarsened trabeculae noted in the upper and mid lumbar spine, felt to most likely represent hemangiomas.  IMPRESSION: Large left pleural effusion with areas of pleural thickening and enhancement throughout the left hemithorax, likely pleural metastatic disease.  Adenopathy in the left internal mammary chain and left lower cervical region. Left axillary adenopathy. Right hilar adenopathy.  Mild underlying centrilobular emphysema.  Large hiatal hernia.  Retrocrural adenopathy and retroperitoneal adenopathy.  Large conglomerate mass in the region of the left adrenal gland and left periaortic region compatible with metastasis.  Large irregular hypodense dense area within the spleen  with smaller scattered hypodense areas. Findings concerning for metastatic disease. 15 mm hypodensity anteriorly in the liver is nonspecific.  Lucent areas with coarsened trabeculae noted in the lower thoracic, upper and mid lumbar spine. Favor hemangiomas. These could be further evaluated with nuclear medicine whole-body bone scan.   Electronically Signed   By: Rolm Baptise M.D.   On: 08/29/2013 11:57   Nm Pet Image Restag (ps) Skull Base To Thigh  08/29/2013   CLINICAL DATA:  Subsequent treatment strategy for breast carcinoma. Malignant pleural effusion.  EXAM: NUCLEAR MEDICINE PET SKULL BASE TO THIGH  TECHNIQUE: 7.7 mCi F-18 FDG was injected intravenously. Full-ring PET imaging was performed from  the skull base to thigh after the radiotracer. CT data was obtained and used for attenuation correction and anatomic localization.  FASTING BLOOD GLUCOSE:  Value: 8 5 mg/dl  COMPARISON:  None available  FINDINGS: NECK  Hypermetabolic left supraclavicular lymph node measures 17 mm short axis with SUV max equals 6.5. Hypermetabolic high left axillary lymph node measures 16 mm short axis (image 23) of fused series) with SUV max equal 8.8. There is a hypermetabolic prevascular lymph node and left hilar lymph nodes. There is a large left pleural effusion.  CHEST  No hypermetabolic mediastinal or hilar nodes. No suspicious pulmonary nodules on the CT scan.  ABDOMEN/PELVIS  There is a hypermetabolic left periaortic retroperitoneal mass measuring 4.3 x 3.2 cm with SUV max equal 8.6. This is likely a conglomerate of lymph nodes. Hypermetabolic retrocrural lymph nodes are also noted.  There is a hypermetabolic focus within the medial aspect of the spleen consistent metastasis (image 34). There small hypermetabolic periaortic lymph nodes just above the bifurcation. No hypermetabolic nodes in the iliac stations. Mild metabolic activity associated with a right inguinal lymph node  SKELETON  There are least 4 sites of hypermetabolic skeletal metastasis. Hypermetabolic focus in the proximal right humerus, image 10, with SUV max 3.9. There is hypermetabolic focus within the mid thoracic spine atT8 with SUV max 5.7. No clear lesion on CT portion. Intense metabolic lesion within the L3 vertebral body with a associated sclerotic lesion on CT and SUV max 9.4. Additional subtle metastasis within the right ischium.  IMPRESSION: 1. Hypermetabolic nodal metastasis in the left supraclavicular and left axillary nodal stations. 2. Hypermetabolic mediastinal and left hilar metastasis. 3. Bulky hypermetabolic left periaortic nodal mass. 4. Hypermetabolic solitary splenic metastasis. 5. Hypermetabolic skeletal metastasis involving the right  humerus, thoracic spine, lumbar spine, and pelvis. 6. Large left effusion.   Electronically Signed   By: Suzy Bouchard M.D.   On: 08/29/2013 12:38   US Thoracentesis Asp Pleural Space W/img Guide  09/05/2013   CLINICAL DATA:  Metastatic breast cancer, left pleural effusion, shortness of breath, request for thoracentesis.  EXAM: ULTRASOUND GUIDED left THORACENTESIS  COMPARISON:  None.  PROCEDURE: An ultrasound guided thoracentesis was thoroughly discussed with the patient and questions answered. The benefits, risks, alternatives and complications were also discussed. The patient understands and wishes to proceed with the procedure. Written consent was obtained.  Ultrasound was performed to localize and mark an adequate pocket of fluid in the left chest. The area was then prepped and draped in the normal sterile fashion. 1% Lidocaine was used for local anesthesia. Under ultrasound guidance a 19 gauge Yueh catheter was introduced. Thoracentesis was performed. The catheter was removed and a dressing applied.  Complications:  None.  FINDINGS: A total of approximately 1.2 liters of clear yellow fluid was removed. A fluid sample  was not sent for laboratory analysis.  IMPRESSION: Successful ultrasound guided left thoracentesis yielding 1.2 liters of pleural fluid.  Read By:  Tsosie Billing PA-C   Electronically Signed   By: Aletta Edouard M.D.   On: 09/05/2013 11:26    ASSESSMENT: 80 y.o. [place] woman with a history of bilateral breast cancer, now stage IV, as follows  (1) LEFT BREAST:  (a) status post left excisional biopsy 05/14/2000 for a pT2 NX, stage II invasive lobular breast cancer, estrogen receptor 83% positive, progesterone receptor 90% positive, HER-2 negative, with positive margins   (b) left breast reexcision for margin clearance, with left axillary lymph node dissection 05/31/2000, with negative margins but 5/15 axillary lymph nodes involved, final stage pT2 pN2, stage IIIA  (c) status post  radiation to the left breast (not to axilla)  (d) on tamoxifen approximately 5 years per patient report  (2) RIGHT BREAST:  (a) status post right lumpectomy and sentinel lymph node sampling 05/272009 for a pT1c pN0, stage IA invasive ductal carcinoma, grade 1, estrogen receptor 99% positive, progesterone receptor and 98% positive, with an MIB-1 of 11%, and no HER-2 amplification.  (b) status post radiation to the right breast completed August 2009  (c) the patient refused antiestrogen therapy and medical oncology evaluation  (3) METASTATIC DISEASE:  (a) staging studies March and April 2015 show extensive metastatic adenopathy (left supraclavicular and left axillary,.  retrocrural, etc.), with splenic involvement, and with significant left pleural involvement and a large left pleural effusion; bone involvement was questionable  (b) left thoracentesis 07/21/2013 confirms a malignant effusion, with adenocarcinoma cells positive for estrogen and progesterone receptor, negative for HER-2 amplification  (c) fulvestrant started 09/04/2013  PLAN: Brianna Duke is doing well today.  She tells me that she is doing fine and that she is tolerating the Faslodex without difficulty.  Her CBC is stable.  She has some mild dullness to percussion and diminished breath sounds in her left lower lung base.  She knows to call us though if she has any recurrent or increased shortness of breath.  I reviewed her lab work which is stable with her today in detail.  She will proceed with Faslodex today.    Brianna Duke has had good understanding of the overall plan. She agrees with it. She knows a goal of treatment in her case is control. She will call with any problems that may develop before her next visit here.  The patient will return on 5/28 for an evaluation by Dr. Jana Hakim and her next injection.   She knows to call us in the interim for any questions or concerns.  We can certainly see her sooner if needed.  I spent 25 minutes  counseling the patient face to face.  The total time spent in the appointment was 30 minutes.   Minette Headland, [place] [phone] 09/20/2013 8:04 AM

## 2013-09-24 NOTE — Progress Notes (Signed)
Read

## 2013-10-02 ENCOUNTER — Other Ambulatory Visit: Payer: Self-pay | Admitting: Oncology

## 2013-10-02 ENCOUNTER — Ambulatory Visit: Payer: Medicare Other

## 2013-10-02 ENCOUNTER — Ambulatory Visit (HOSPITAL_BASED_OUTPATIENT_CLINIC_OR_DEPARTMENT_OTHER): Payer: Medicare Other | Admitting: Internal Medicine

## 2013-10-02 ENCOUNTER — Ambulatory Visit (HOSPITAL_BASED_OUTPATIENT_CLINIC_OR_DEPARTMENT_OTHER): Payer: Medicare Other

## 2013-10-02 VITALS — BP 138/76 | HR 72 | Temp 97.7°F | Resp 20 | Ht 63.25 in | Wt 126.5 lb

## 2013-10-02 DIAGNOSIS — Z5111 Encounter for antineoplastic chemotherapy: Secondary | ICD-10-CM | POA: Diagnosis not present

## 2013-10-02 DIAGNOSIS — C50919 Malignant neoplasm of unspecified site of unspecified female breast: Secondary | ICD-10-CM

## 2013-10-02 DIAGNOSIS — C50911 Malignant neoplasm of unspecified site of right female breast: Secondary | ICD-10-CM

## 2013-10-02 DIAGNOSIS — C50912 Malignant neoplasm of unspecified site of left female breast: Secondary | ICD-10-CM

## 2013-10-02 MED ORDER — FULVESTRANT 250 MG/5ML IM SOLN
500.0000 mg | Freq: Once | INTRAMUSCULAR | Status: AC
  Administered 2013-10-02: 500 mg via INTRAMUSCULAR
  Filled 2013-10-02: qty 10

## 2013-10-02 NOTE — Progress Notes (Signed)
[place]  [phone] Fax:([phone]) [phone]     ID: Kerrin Mo Findling OB: [birth year]  MR#: [id number]  OJJ#:[id number]  PCP: [name], MD GYN:   SU:  OTHER MD: [name], james crossley  CHIEF COMPLAINT: Metastatic breast cancer  BREAST CANCER HISTORY: From [name] intake note dated 11/05/2007:  "She presented to Dr. [name] back in February for a complete physical exam.  She has a history of breast cancer treated back in 1992 by Dr. [name].  She had had a left lumpectomy by Dr. [name] for a reasonably large lesion, although I do not have any of the detail and she also had lymph nodes involved at that time, which I believe, 5 of 15 nodes were involved.  Pathology department no longer has this information.  She did have external beam radiation therapy for this.  We are awaiting that information as well.  For her complete physical, he recommended screening mammogram.  This was performed March 3 and it showed a possible mass noted in the right breast.  Spot compression views and possibly ultrasound were recommended for further evaluation.  Left breast was unremarkable.  She came back for that on March 27 and this showed that the question small mass in the right breast at the 12 o'clock position persisted measuring 4 cm from the nipple, a 0.76 x 0.9 cm hypoechoic mass correlating to the mammographic findings.  They recommended further workup.  On March 30, she had a core biopsy and this showed invasive ductal carcinoma ER/PR positive, HER-2/neu negative.  She went on to have an MRI of the bilateral breasts on April 9 and it showed a 1.1 x 0.8 x 0.5 known invasive ductal carcinoma at the 12 o'clock position of the right breast with an adjacent 3 mm possible satellite lesion immediately adjacent to the posteroinferior aspect of this mass.  There were multiple additional right breast nodules 4 mm or less in maximum diameter most likely representing  normal background nodular parenchyma.  None of these were large enough to warrant a biopsy at the time.  There was no background parenchymal nodularity or enhancement in the left breast possibly due to previous radiation.  There was asymmetrical and mildly prominent right axillary and retropectoral lymph nodes and a second look ultrasound of the right axilla was recommended.  She was given an ultrasound appointment at 11 a.m. on April 10; however, she called in and canceled that appointment saying she does not want to reschedule.  She told me that she had gone up to [place] and was considering not having any kind of treatment at all.  She had multiple small liver masses also noted on MRI, which were too small to accurately characterize on the current images, moderate-sized hiatal hernia.  She then finally returned to town and agreed to have surgery and chest x-ray on May 21, showed slight hyperinflation but no acute process seen.  She went on to have her lumpectomy and sentinel node biopsy by [name] on May 27.  This showed a 1.1 cm invasive ductal carcinoma grade 1 with low-grade DCIS with the DCIS margin being less than 0.1 cm in the lateral superficial resection margins.  Two sentinel nodes were negative.  [name] discussed with her the need for radiation and has sent her here for our opinion regarding the role of radiation.  He also discussed with her hormone receptor status being positive, but the patient declined taking any sort of medication or even  having a medical oncology referral."  The patient did complete radiation to the right breast 12/14/2007, with a boost to a total dose of 6280 cGy . She was subsequently lost to followup.  More recently the patient noted left sided supraclavicular adenopathy. She brought this to the attention of Dr. [name] who obtained a chest CT 07/16/2013 at [facility]. This showed left supraclavicular and anterior mediastinal adenopathy, a left-sided pleural  effusion on with evidence of left-sided pleural disease, a left adrenal mass and probable retroperitoneal adenopathy, several splenic lesions, some right retrocrural adenopathy, nonspecific liver lesions and also evidence of aortic atherosclerosis and emphysema. On 07/21/2013 the patient underwent left thoracentesis, in the pleural fluid showed ([id number]) malignant cells consistent with breast adenocarcinoma, estrogen receptor 35% positive, progesterone receptor 22% positive, with no HER-2 amplification, the signals ratio being 1.40 and the number per cell 1.75.  Her subsequent history is as detailed below.  INTERVAL HISTORY: She denies any significant hot flashes, or any arthralgias or myalgias. She is able to do all activities of daily living. She maintains that she has good oral intake. Food taste goods. She has no issues with bowel movement or urine function. She smokes half a pack to a pack a day. Denies any chest pain. Denies shortness of breath.  ROS:  Negative for respiratory, negative for skin, negative for cardiovascular, negative for gastroenterology, negative for endocrine, negative for ID, negative for genitourinary,negative for constitutional symptoms, negative for musculoskeletal, negative for HEENT  PAST MEDICAL HISTORY: Past Medical History  Diagnosis Date  . Hyperlipidemia   . Bronchitis     history  . Retinal detachment     history of  . COPD (chronic obstructive pulmonary disease)   . Meniere's disease     surgery  . History of radiation therapy 06/15/00- 08/22/00    left breast, 5940 cGy 33 fractions  . Hx of radiation therapy 11/18/07- 01/02/08    right breast 2880 cGy 16 fractions, right breast 2000 cGy 10 fractions, right breast boost 1400 cGy 7 fractions  . Anxiety   . Varicose veins   . Hypertension   . Thyroid disease     hypothyroid  . Insomnia   . Chronic kidney disease     stage 2  . Breast cancer 05/31/00     left lower outer   . Malignant neoplasm of  breast (female), unspecified site 10/02/07    right 12 o'clock , invasive ductal  . Skin cancer     squamous cell- face  . Baker's cyst of knee     right knee  . Metastases to the liver     breast primary  . 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 Metastasis to adrenal gland   . Pleural effusion, malignant 07/21/13    left lung, breast primary  . Cataract     surgery onboth  . Depression     PAST SURGICAL HISTORY: Past Surgical History  Procedure Laterality Date  . Appendectomy      80 yrs old  . Pituitary surgery  2005    adenoma removed  . Thyroidectomy, partial  1991  . Tonsillectomy      age 62    FAMILY HISTORY Family History  Problem Relation Age of Onset  . Cancer Mother   . Cancer Sister     ovarian  . Cancer Sister 17    breast   the patient's father died at the age of 74 in a tractor accident. The patient is one  of 9 siblings. Only at 2 siblings survive in addition to her. One is her brother Carmin Muskrat who lives in [place], and sister Alisia Ferrari who lives in [place].Marland KitchenHe can be reached at [phone].   GYNECOLOGIC HISTORY:  Menarche age 89. The patient is GX P0. 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 She moved to [place] from [place] around that time. She is single. She lives alone, with no pets. She attends the good [employer]. In addition to her siblings, listed above, she frequently visits her niece Meryl Dare in [place]. She can be reached at [phone], and her friend Renne Musca who lives in [place].  ADVANCED DIRECTIVES: Not in place; on her 09/04/2013 visit the patient was given healthcare power of attorney and living will documents to complete and notarize at her discretion   HEALTH MAINTENANCE: History  Substance Use Topics  . Smoking status: Current  Every Day Smoker -- 1.00 packs/day for 30 years    Types: Cigarettes  . Smokeless tobacco: Not on file  . Alcohol Use: Yes     Comment: rarely   No Known Allergies  Current Outpatient Prescriptions  Medication Sig Dispense Refill  . levothyroxine (SYNTHROID, LEVOTHROID) 75 MCG tablet Take 75 mcg by mouth daily before breakfast.      . LORazepam (ATIVAN) 1 MG tablet Take 1 mg by mouth 2 (two) times daily as needed for anxiety.      . naproxen sodium (ANAPROX) 220 MG tablet Take 220 mg by mouth 2 (two) times daily as needed.       . Omega-3 Fatty Acids (FISH OIL) 1000 MG CAPS Take 2 capsules by mouth daily.      . Vitamin D, Ergocalciferol, (DRISDOL) 50000 UNITS CAPS capsule Take 1,000 Units by mouth daily.       No current facility-administered medications for this visit.    OBJECTIVE: Older white woman who appears stated age 38 Vitals:   10/02/13 1152  BP: 138/76  Pulse: 72  Temp: 97.7 F (36.5 C)  Resp: 20     Body mass index is 22.22 kg/(m^2).     GENERAL: Patient is older female in no acute distress. Feisty today. HEENT:  Sclerae anicteric.  Oropharynx clear and moist. No ulcerations or evidence of oropharyngeal candidiasis. Neck is supple.  NODES:  No cervical, supraclavicular, or axillary lymphadenopathy palpated.  BREAST EXAM: s/p left lumpectomy, no nodularity, left breast and nipple does point downward and nipple is touching abdomen.   LUNGS:  Clear to auscultation bilaterally.  No wheezes or rhonchi. HEART:  Regular rate and rhythm. No murmur appreciated. ABDOMEN:  Soft, nontender.  Positive, normoactive bowel sounds. No organomegaly palpated. MSK:  No focal spinal tenderness to palpation. Full range of motion bilaterally in the upper extremities. EXTREMITIES:  No peripheral edema.   SKIN:  Clear with no obvious rashes or skin changes. No nail dyscrasia. NEURO:  Nonfocal. Well oriented.  Appropriate affect. ECOG FS:1 - Symptomatic but completely ambulatory  LAB  RESULTS:  CMP     Component Value Date/Time   NA 143 09/18/2013 1037   NA 138 09/26/2007 1206   K 3.8 09/18/2013 1037   K 3.6 09/26/2007 1206   CL 104 09/26/2007 1206   CO2 25 09/18/2013 1037   CO2 24 09/26/2007 1206   GLUCOSE 95 09/18/2013  1037   GLUCOSE 118* 09/26/2007 1206   BUN 14.2 09/18/2013 1037   BUN 8 09/26/2007 1206   CREATININE 0.9 09/18/2013 1037   CREATININE 0.78 09/26/2007 1206   CALCIUM 9.2 09/18/2013 1037   CALCIUM 9.1 09/26/2007 1206   PROT 6.1* 09/18/2013 1037   ALBUMIN 3.4* 09/18/2013 1037   AST 16 09/18/2013 1037   ALT 13 09/18/2013 1037   ALKPHOS 89 09/18/2013 1037   BILITOT 0.30 09/18/2013 1037   GFRNONAA >60 09/26/2007 1206   GFRAA  Value: >60        The eGFR has been calculated using the MDRD equation. This calculation has not been validated in all clinical 09/26/2007 1206   I No results found for this basename: SPEP,  UPEP,   kappa and lambda light chains    Lab Results  Component Value Date   WBC 7.4 09/18/2013   NEUTROABS 5.0 09/18/2013   HGB 13.1 09/18/2013   HCT 40.2 09/18/2013   MCV 85.5 09/18/2013   PLT 208 09/18/2013      Chemistry      Component Value Date/Time   NA 143 09/18/2013 1037   NA 138 09/26/2007 1206   K 3.8 09/18/2013 1037   K 3.6 09/26/2007 1206   CL 104 09/26/2007 1206   CO2 25 09/18/2013 1037   CO2 24 09/26/2007 1206   BUN 14.2 09/18/2013 1037   BUN 8 09/26/2007 1206   CREATININE 0.9 09/18/2013 1037   CREATININE 0.78 09/26/2007 1206      Component Value Date/Time   CALCIUM 9.2 09/18/2013 1037   CALCIUM 9.1 09/26/2007 1206   ALKPHOS 89 09/18/2013 1037   AST 16 09/18/2013 1037   ALT 13 09/18/2013 1037   BILITOT 0.30 09/18/2013 1037       No results found for this basename: LABCA2    No components found with this basename: LABCA125    No results found for this basename: INR,  in the last 168 hours  Urinalysis No results found for this basename: colorurine,  appearanceur,  labspec,  phurine,  glucoseu,  hgbur,  bilirubinur,  ketonesur,   proteinur,  urobilinogen,  nitrite,  leukocytesur    STUDIES: Dg Chest 1 View  09/05/2013   CLINICAL DATA:  post left sided thoracentesis 1.2 liters  EXAM: CHEST - 1 VIEW  COMPARISON:  US THORACENTESIS ASP PLEURAL SPACE W/IMG GUIDE dated 09/05/2013; DG CHEST 1 VIEW dated 07/21/2013; CT CHEST W/CM dated 08/29/2013  FINDINGS: The cardiac silhouette is within normal limits. There is no evidence of a pneumothorax status post thoracentesis. A small residual left effusion is appreciated. There is diffuse mild increased density in the left lung base may reflect a hypoventilation. The lungs otherwise clear. Surgical clips are appreciated within the left axilla. No acute osseous abnormalities. A moderate to large hiatal hernia is appreciated.  IMPRESSION: No evidence of a pneumothorax  Small residual left pleural effusion  Possibly hypoventilation within the left lung base   Electronically Signed   By: Margaree Mackintosh M.D.   On: 09/05/2013 12:07   Ct Chest W Contrast  08/29/2013   CLINICAL DATA:  Breast cancer. Malignant pleural effusion. Restaging.  EXAM: CT CHEST, ABDOMEN, AND PELVIS WITH CONTRAST  TECHNIQUE: Multidetector CT imaging of the chest, abdomen and pelvis was performed following the standard protocol during bolus administration of intravenous contrast.  CONTRAST:  2m OMNIPAQUE IOHEXOL 300 MG/ML  SOLN  COMPARISON:  None.  FINDINGS: CT CHEST FINDINGS  Large left pleural effusion. Areas of  pleural enhancement noted posteriorly and medially with pleural thickening (see image 32). Other areas of pleural thickening and enhancement noted as well. Masslike pleural thickening noted at the left base on image 57 and anteriorly on image 53.  Heart is normal size. Aorta is normal caliber. Extensive irregular plaque throughout the descending thoracic aorta. Moderate to large hiatal hernia.  Mildly enlarged right hilar lymph node measuring 11 mm in short axis diameter on image 24. Left internal mammary lymph node with  central necrosis on image 29 has a short axis diameter of 17 mm. There is adjacent anterior pleural thickening/enhancement on image 24. Left lower cervical/supraclavicular nodal mass on image 7 has a short axis diameter of 2.7 cm. Left axillary adenopathy noted. Index node has a short axis diameter of 14 mm on image 18.  Review of lung windows demonstrates compressive atelectasis in the left lower lobe. Triangular density noted at the left lung base anteriorly on image 48. Linear interstitial thickening noted anteriorly in the left upper lobe, likely postradiation changes. Mild underlying centrilobular emphysema. No suspicious pulmonary nodules.  No definite focal osseous lesion. Degenerative changes in the thoracic spine. Probable hemangiomas within the lower thoracic and upper lumbar vertebral bodies.  CT ABDOMEN AND PELVIS FINDINGS  Large left periaortic mass contiguous with the left adrenal gland, left diaphragmatic hiatus measuring 4.3 x 4.0 cm on image 61 of series 2. Right periaortic abnormal soft tissue represents retrocrural adenopathy on this image 62 measuring measuring 2.6 x 1.3 cm. Retrocrural adenopathy on image 54 measures 2.0 x 1.5 cm other mildly enlarged retroperitoneal lymph nodes.  Hypodensity anteriorly within the liver measures 15 mm in diameter and is nonspecific. Ill-defined/irregular hypodense, likely solid area within the spleen measures up to 4.8 cm on image 58. Other scattered smaller hypodense areas throughout the spleen. Pancreas, right adrenal, kidneys are unremarkable. Small cysts in the mid and lower pole of the left kidney. No hydronephrosis.  Stomach, small bowel unremarkable. Scattered descending colonic and sigmoid diverticulosis. Uterus, adnexae and urinary bladder unremarkable. No free fluid or free air.  Aneurysmal dilatation of the abdominal aorta measuring up to 3.2 cm. Irregular plaque throughout the abdominal aorta.  Trace free fluid in the pelvis.  Degenerative changes in  the lumbar spine. Lucent areas with coarsened trabeculae noted in the upper and mid lumbar spine, felt to most likely represent hemangiomas.  IMPRESSION: Large left pleural effusion with areas of pleural thickening and enhancement throughout the left hemithorax, likely pleural metastatic disease.  Adenopathy in the left internal mammary chain and left lower cervical region. Left axillary adenopathy. Right hilar adenopathy.  Mild underlying centrilobular emphysema.  Large hiatal hernia.  Retrocrural adenopathy and retroperitoneal adenopathy.  Large conglomerate mass in the region of the left adrenal gland and left periaortic region compatible with metastasis.  Large irregular hypodense dense area within the spleen with smaller scattered hypodense areas. Findings concerning for metastatic disease. 15 mm hypodensity anteriorly in the liver is nonspecific.  Lucent areas with coarsened trabeculae noted in the lower thoracic, upper and mid lumbar spine. Favor hemangiomas. These could be further evaluated with nuclear medicine whole-body bone scan.   Electronically Signed   By: Rolm Baptise M.D.   On: 08/29/2013 11:57   Nm Pet Image Restag (ps) Skull Base To Thigh  08/29/2013   CLINICAL DATA:  Subsequent treatment strategy for breast carcinoma. Malignant pleural effusion.  EXAM: NUCLEAR MEDICINE PET SKULL BASE TO THIGH  TECHNIQUE: 7.7 mCi F-18 FDG was injected intravenously. Full-ring PET  imaging was performed from the skull base to thigh after the radiotracer. CT data was obtained and used for attenuation correction and anatomic localization.  FASTING BLOOD GLUCOSE:  Value: 8 5 mg/dl  COMPARISON:  None available  FINDINGS: NECK  Hypermetabolic left supraclavicular lymph node measures 17 mm short axis with SUV max equals 6.5. Hypermetabolic high left axillary lymph node measures 16 mm short axis (image 23) of fused series) with SUV max equal 8.8. There is a hypermetabolic prevascular lymph node and left hilar lymph  nodes. There is a large left pleural effusion.  CHEST  No hypermetabolic mediastinal or hilar nodes. No suspicious pulmonary nodules on the CT scan.  ABDOMEN/PELVIS  There is a hypermetabolic left periaortic retroperitoneal mass measuring 4.3 x 3.2 cm with SUV max equal 8.6. This is likely a conglomerate of lymph nodes. Hypermetabolic retrocrural lymph nodes are also noted.  There is a hypermetabolic focus within the medial aspect of the spleen consistent metastasis (image 34). There small hypermetabolic periaortic lymph nodes just above the bifurcation. No hypermetabolic nodes in the iliac stations. Mild metabolic activity associated with a right inguinal lymph node  SKELETON  There are least 4 sites of hypermetabolic skeletal metastasis. Hypermetabolic focus in the proximal right humerus, image 10, with SUV max 3.9. There is hypermetabolic focus within the mid thoracic spine atT8 with SUV max 5.7. No clear lesion on CT portion. Intense metabolic lesion within the L3 vertebral body with a associated sclerotic lesion on CT and SUV max 9.4. Additional subtle metastasis within the right ischium.  IMPRESSION: 1. Hypermetabolic nodal metastasis in the left supraclavicular and left axillary nodal stations. 2. Hypermetabolic mediastinal and left hilar metastasis. 3. Bulky hypermetabolic left periaortic nodal mass. 4. Hypermetabolic solitary splenic metastasis. 5. Hypermetabolic skeletal metastasis involving the right humerus, thoracic spine, lumbar spine, and pelvis. 6. Large left effusion.   Electronically Signed   By: Suzy Bouchard M.D.   On: 08/29/2013 12:38   US Thoracentesis Asp Pleural Space W/img Guide  09/05/2013   CLINICAL DATA:  Metastatic breast cancer, left pleural effusion, shortness of breath, request for thoracentesis.  EXAM: ULTRASOUND GUIDED left THORACENTESIS  COMPARISON:  None.  PROCEDURE: An ultrasound guided thoracentesis was thoroughly discussed with the patient and questions answered. The  benefits, risks, alternatives and complications were also discussed. The patient understands and wishes to proceed with the procedure. Written consent was obtained.  Ultrasound was performed to localize and mark an adequate pocket of fluid in the left chest. The area was then prepped and draped in the normal sterile fashion. 1% Lidocaine was used for local anesthesia. Under ultrasound guidance a 19 gauge Yueh catheter was introduced. Thoracentesis was performed. The catheter was removed and a dressing applied.  Complications:  None.  FINDINGS: A total of approximately 1.2 liters of clear yellow fluid was removed. A fluid sample was not sent for laboratory analysis.  IMPRESSION: Successful ultrasound guided left thoracentesis yielding 1.2 liters of pleural fluid.  Read By:  Tsosie Billing PA-C   Electronically Signed   By: Aletta Edouard M.D.   On: 09/05/2013 11:26    ASSESSMENT: 80 y.o. [place] woman with a history of bilateral breast cancer, now stage IV, as follows  (1) LEFT BREAST:  (a) status post left excisional biopsy 05/14/2000 for a pT2 NX, stage II invasive lobular breast cancer, estrogen receptor 83% positive, progesterone receptor 90% positive, HER-2 negative, with positive margins   (b) left breast reexcision for margin clearance, with left axillary lymph node  dissection 05/31/2000, with negative margins but 5/15 axillary lymph nodes involved, final stage pT2 pN2, stage IIIA  (c) status post radiation to the left breast (not to axilla)  (d) on tamoxifen approximately 5 years per patient report  (2) RIGHT BREAST:  (a) status post right lumpectomy and sentinel lymph node sampling 05/272009 for a pT1c pN0, stage IA invasive ductal carcinoma, grade 1, estrogen receptor 99% positive, progesterone receptor and 98% positive, with an MIB-1 of 11%, and no HER-2 amplification.  (b) status post radiation to the right breast completed August 2009  (c) the patient refused antiestrogen therapy and  medical oncology evaluation  (3) METASTATIC DISEASE:  (a) staging studies March and April 2015 show extensive metastatic adenopathy (left supraclavicular and left axillary,.  retrocrural, etc.), with splenic involvement, and with significant left pleural involvement and a large left pleural effusion; bone involvement was questionable  (b) left thoracentesis 07/21/2013 confirms a malignant effusion, with adenocarcinoma cells positive for estrogen and progesterone receptor, negative for HER-2 amplification  (c) fulvestrant started 09/04/2013  PLAN: Recommend that we go ahead with  Faslodex injection today. She had questions if these injections would go on indefinitely. I informed her that she would remain on Faslodex injections monthly, as long as it benefited her.  Followup in a month, with physician and for her injection.    Multiple questions answered.  I spent 15 minutes face-to-face with patient with more than 50% spent on counseling.   Dr. Doristine Church 10/02/2013 6:04 PM

## 2013-10-02 NOTE — Patient Instructions (Signed)
Fulvestrant injection What is this medicine? FULVESTRANT (ful VES trant) blocks the effects of estrogen. It is used to treat breast cancer in women past the age of menopause. This medicine may be used for other purposes; ask your health care provider or pharmacist if you have questions. COMMON BRAND NAME(S): FASLODEX What should I tell my health care provider before I take this medicine? They need to know if you have any of these conditions: -bleeding problems -liver disease -low levels of platelets in the blood -an unusual or allergic reaction to fulvestrant, other medicines, foods, dyes, or preservatives -pregnant or trying to get pregnant -breast-feeding How should I use this medicine? This medicine is for injection into a muscle. It is usually given by a health care professional in a hospital or clinic setting. Talk to your pediatrician regarding the use of this medicine in children. Special care may be needed. Overdosage: If you think you have taken too much of this medicine contact a poison control center or emergency room at once. NOTE: This medicine is only for you. Do not share this medicine with others. What if I miss a dose? It is important not to miss your dose. Call your doctor or health care professional if you are unable to keep an appointment. What may interact with this medicine? -medicines that treat or prevent blood clots like warfarin, enoxaparin, and dalteparin This list may not describe all possible interactions. Give your health care provider a list of all the medicines, herbs, non-prescription drugs, or dietary supplements you use. Also tell them if you smoke, drink alcohol, or use illegal drugs. Some items may interact with your medicine. What should I watch for while using this medicine? Your condition will be monitored carefully while you are receiving this medicine. You will need important blood work done while you are taking this medicine. Do not become pregnant  while taking this medicine. Women should inform their doctor if they wish to become pregnant or think they might be pregnant. There is a potential for serious side effects to an unborn child. Talk to your health care professional or pharmacist for more information. What side effects may I notice from receiving this medicine? Side effects that you should report to your doctor or health care professional as soon as possible: -allergic reactions like skin rash, itching or hives, swelling of the face, lips, or tongue -feeling faint or lightheaded, falls -fever or flu-like symptoms -sore throat -vaginal bleeding Side effects that usually do not require medical attention (report to your doctor or health care professional if they continue or are bothersome): -aches, pains -constipation or diarrhea -headache -hot flashes -nausea, vomiting -pain at site where injected -stomach pain This list may not describe all possible side effects. Call your doctor for medical advice about side effects. You may report side effects to FDA at 1-800-FDA-1088. Where should I keep my medicine? This drug is given in a hospital or clinic and will not be stored at home. NOTE: This sheet is a summary. It may not cover all possible information. If you have questions about this medicine, talk to your doctor, pharmacist, or health care provider.  2014, [employer]. (2007-09-02 15:39:24)  

## 2013-10-21 ENCOUNTER — Other Ambulatory Visit: Payer: Self-pay | Admitting: *Deleted

## 2013-10-21 NOTE — Progress Notes (Signed)
Patient needs to be seen by breast cancer provider on 6/29 or 6/30 with Faslodex injection same day.

## 2013-10-23 ENCOUNTER — Telehealth: Payer: Self-pay | Admitting: Oncology

## 2013-10-23 NOTE — Telephone Encounter (Signed)
lvm for pt6 regarding to June/July and Aug appts

## 2013-10-29 ENCOUNTER — Telehealth: Payer: Self-pay | Admitting: *Deleted

## 2013-10-29 NOTE — Telephone Encounter (Signed)
Spoke with patient and rescheduled and confirmed new appointment and change of provider to 11/03/13 at 245 with [place] and 315 for injection.

## 2013-10-30 ENCOUNTER — Ambulatory Visit: Payer: Medicare Other

## 2013-11-03 ENCOUNTER — Ambulatory Visit (HOSPITAL_BASED_OUTPATIENT_CLINIC_OR_DEPARTMENT_OTHER): Payer: Medicare Other | Admitting: Nurse Practitioner

## 2013-11-03 ENCOUNTER — Ambulatory Visit (HOSPITAL_BASED_OUTPATIENT_CLINIC_OR_DEPARTMENT_OTHER): Payer: Medicare Other

## 2013-11-03 VITALS — BP 127/73 | HR 80 | Temp 98.1°F | Resp 18 | Ht 63.0 in | Wt 125.0 lb

## 2013-11-03 DIAGNOSIS — F17200 Nicotine dependence, unspecified, uncomplicated: Secondary | ICD-10-CM | POA: Diagnosis not present

## 2013-11-03 DIAGNOSIS — C50919 Malignant neoplasm of unspecified site of unspecified female breast: Secondary | ICD-10-CM

## 2013-11-03 DIAGNOSIS — Z720 Tobacco use: Secondary | ICD-10-CM

## 2013-11-03 DIAGNOSIS — Z853 Personal history of malignant neoplasm of breast: Secondary | ICD-10-CM | POA: Diagnosis not present

## 2013-11-03 DIAGNOSIS — C773 Secondary and unspecified malignant neoplasm of axilla and upper limb lymph nodes: Secondary | ICD-10-CM

## 2013-11-03 DIAGNOSIS — C770 Secondary and unspecified malignant neoplasm of lymph nodes of head, face and neck: Secondary | ICD-10-CM | POA: Diagnosis not present

## 2013-11-03 DIAGNOSIS — F419 Anxiety disorder, unspecified: Secondary | ICD-10-CM

## 2013-11-03 DIAGNOSIS — J910 Malignant pleural effusion: Secondary | ICD-10-CM

## 2013-11-03 DIAGNOSIS — Z5111 Encounter for antineoplastic chemotherapy: Secondary | ICD-10-CM | POA: Diagnosis not present

## 2013-11-03 MED ORDER — FULVESTRANT 250 MG/5ML IM SOLN
500.0000 mg | Freq: Once | INTRAMUSCULAR | Status: AC
Start: 2013-11-03 — End: 2013-11-03
  Administered 2013-11-03: 500 mg via INTRAMUSCULAR
  Filled 2013-11-03: qty 10

## 2013-11-03 NOTE — Progress Notes (Signed)
[place]  [phone] [phone]) [phone]     ID: Brianna Duke OB: [birth year]  MR#: [id number]  KKX#:[id number]  PCP: [name], MD GYN:   SU:  OTHER MD: [name], james crossley  CHIEF COMPLAINT: Metastatic breast cancer  BREAST CANCER HISTORY: From [name] intake note dated 11/05/2007:  "She presented to Dr. [name] back in February for a complete physical exam.  She has a history of breast cancer treated back in 1992 by Dr. [name].  She had had a left lumpectomy by Dr. [name] for a reasonably large lesion, although I do not have any of the detail and she also had lymph nodes involved at that time, which I believe, 5 of 15 nodes were involved.  Pathology department no longer has this information.  She did have external beam radiation therapy for this.  We are awaiting that information as well.  For her complete physical, he recommended screening mammogram.  This was performed March 3 and it showed a possible mass noted in the right breast.  Spot compression views and possibly ultrasound were recommended for further evaluation.  Left breast was unremarkable.  She came back for that on March 27 and this showed that the question small mass in the right breast at the 12 o'clock position persisted measuring 4 cm from the nipple, a 0.76 x 0.9 cm hypoechoic mass correlating to the mammographic findings.  They recommended further workup.  On March 30, she had a core biopsy and this showed invasive ductal carcinoma ER/PR positive, HER-2/neu negative.  She went on to have an MRI of the bilateral breasts on April 9 and it showed a 1.1 x 0.8 x 0.5 known invasive ductal carcinoma at the 12 o'clock position of the right breast with an adjacent 3 mm possible satellite lesion immediately adjacent to the posteroinferior aspect of this mass.  There were multiple additional right breast nodules 4 mm or less in maximum diameter most likely representing  normal background nodular parenchyma.  None of these were large enough to warrant a biopsy at the time.  There was no background parenchymal nodularity or enhancement in the left breast possibly due to previous radiation.  There was asymmetrical and mildly prominent right axillary and retropectoral lymph nodes and a second look ultrasound of the right axilla was recommended.  She was given an ultrasound appointment at 11 a.m. on April 10; however, she called in and canceled that appointment saying she does not want to reschedule.  She told me that she had gone up to [place] and was considering not having any kind of treatment at all.  She had multiple small liver masses also noted on MRI, which were too small to accurately characterize on the current images, moderate-sized hiatal hernia.  She then finally returned to town and agreed to have surgery and chest x-ray on May 21, showed slight hyperinflation but no acute process seen.  She went on to have her lumpectomy and sentinel node biopsy by [name] on May 27.  This showed a 1.1 cm invasive ductal carcinoma grade 1 with low-grade DCIS with the DCIS margin being less than 0.1 cm in the lateral superficial resection margins.  Two sentinel nodes were negative.  [name] discussed with her the need for radiation and has sent her here for our opinion regarding the role of radiation.  He also discussed with her hormone receptor status being positive, but the patient declined taking any sort of medication or even  having a medical oncology referral."  The patient did complete radiation to the right breast 12/14/2007, with a boost to a total dose of 6280 cGy . She was subsequently lost to followup.  More recently the patient noted left sided supraclavicular adenopathy. She brought this to the attention of Dr. [name] who obtained a chest CT 07/16/2013 at [facility] This showed left supraclavicular and anterior mediastinal adenopathy, a left-sided pleural  effusion on with evidence of left-sided pleural disease, a left adrenal mass and probable retroperitoneal adenopathy, several splenic lesions, some right retrocrural adenopathy, nonspecific liver lesions and also evidence of aortic atherosclerosis and emphysema. On 07/21/2013 the patient underwent left thoracentesis, in the pleural fluid showed ([id number]) malignant cells consistent with breast adenocarcinoma, estrogen receptor 35% positive, progesterone receptor 22% positive, with no HER-2 amplification, the signals ratio being 1.40 and the number per cell 1.75.  Her subsequent history is as detailed below.  INTERVAL HISTORY: Brianna Duke has very significant bruising on the right side of her face which she attributes to falling during the night when getting up to get a drink of water. Her friend who stays with her tried in vain to let her call EMS or to take her to ER for evaluation but the patient declined. She has healing bruises on the periorbital socket and the entire right side of her face and she is emphatic she is ok and wants no further evaluation of her injury.  She continues to smoke but states she is trying to quit and that she does not smoke in the bedroom. She takes Ativan for her anxiety. She denies any significant hot flashes, or any arthralgias or myalgias. She is able to do all activities of daily living. She maintains that she has good oral intake. She has no issues with bowel movement or urine function. She smokes half a pack to a pack a day. Denies any chest pain. Denies shortness of breath.  ROS:  As noted above. Otherwise negative.   PAST MEDICAL HISTORY: Past Medical History  Diagnosis Date  . Hyperlipidemia   . Bronchitis     history  . Retinal detachment     history of  . COPD (chronic obstructive pulmonary disease)   . Meniere's disease     surgery  . History of radiation therapy 06/15/00- 08/22/00    left breast, 5940 cGy 33 fractions  . Hx of radiation therapy 11/18/07-  01/02/08    right breast 2880 cGy 16 fractions, right breast 2000 cGy 10 fractions, right breast boost 1400 cGy 7 fractions  . Anxiety   . Varicose veins   . Hypertension   . Thyroid disease     hypothyroid  . Insomnia   . Chronic kidney disease     stage 2  . Breast cancer 05/31/00     left lower outer   . Malignant neoplasm of breast (female), unspecified site 10/02/07    right 12 o'clock , invasive ductal  . Skin cancer     squamous cell- face  . Baker's cyst of knee     right knee  . Metastases to the liver     breast primary  . 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 Metastasis to adrenal gland   . Pleural effusion, malignant 07/21/13    left lung, breast primary  . Cataract     surgery onboth  . Depression     PAST SURGICAL HISTORY: Past Surgical History  Procedure Laterality Date  . Appendectomy  80 yrs old  . Pituitary surgery  2005    adenoma removed  . Thyroidectomy, partial  1991  . Tonsillectomy      age 37    FAMILY HISTORY Family History  Problem Relation Age of Onset  . Cancer Mother   . Cancer Sister     ovarian  . Cancer Sister 50    breast   the patient's father died at the age of 36 in a tractor accident. The patient is one of 9 siblings. Only at 2 siblings survive in addition to her. One is her brother Carmin Muskrat who lives in [place], and sister Alisia Ferrari who lives in [place].Marland KitchenHe can be reached at [phone].   GYNECOLOGIC HISTORY:  Menarche age 58. The patient is GX P0. 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 She moved to [place] from [place] around that time. She is single. She lives alone, with no pets. She attends the good [employer]. In addition to her siblings, listed above, she frequently visits her niece Meryl Dare in [place]. She can  be reached at [phone], and her friend Renne Musca who lives in [place].  ADVANCED DIRECTIVES: Not in place; on her 09/04/2013 visit the patient was given healthcare power of attorney and living will documents to complete and notarize at her discretion   HEALTH MAINTENANCE: History  Substance Use Topics  . Smoking status: Current Every Day Smoker -- 1.00 packs/day for 30 years    Types: Cigarettes  . Smokeless tobacco: Not on file  . Alcohol Use: Yes     Comment: rarely   No Known Allergies  Current Outpatient Prescriptions  Medication Sig Dispense Refill  . levothyroxine (SYNTHROID, LEVOTHROID) 75 MCG tablet Take 75 mcg by mouth daily before breakfast.      . LORazepam (ATIVAN) 1 MG tablet Take 1 mg by mouth 2 (two) times daily as needed for anxiety.      . naproxen sodium (ANAPROX) 220 MG tablet Take 220 mg by mouth 2 (two) times daily as needed.       . Omega-3 Fatty Acids (FISH OIL) 1000 MG CAPS Take 2 capsules by mouth daily.      . Vitamin D, Ergocalciferol, (DRISDOL) 50000 UNITS CAPS capsule Take 1,000 Units by mouth daily.       No current facility-administered medications for this visit.    OBJECTIVE: Older white woman who appears stated age with extensive bruising to right side of face Filed Vitals:   11/03/13 1441  BP: 127/73  Pulse: 80  Temp: 98.1 F (36.7 C)  Resp: 18     Body mass index is 22.15 kg/(m^2).     GENERAL: Patient is older female in no acute distress. F HEENT:  Extensive bruising noted to right periorbital area and right side of face; conjunctival hemhorrage noted to right eye. NODES:  No cervical, supraclavicular, or axillary lymphadenopathy palpated.  BREAST EXAM: s/p left lumpectomy, no nodularity, left breast and nipple does point downward and nipple is touching abdomen.  No new or suspicious areas noted.   LUNGS:  Clear to auscultation bilaterally.  No wheezes or rhonchi.No crackles. HEART:  Regular rate and rhythm. No murmur  appreciated. ABDOMEN:  Soft, nontender.  Positive, normoactive bowel sounds. No organomegaly palpated. MSK:  No focal spinal tenderness to palpation. Full range of  motion bilaterally in the upper extremities. EXTREMITIES:  No peripheral edema.   SKIN:  Clear with no obvious rashes or skin changes. No nail dyscrasia. NEURO:  Nonfocal. Well oriented.  Appropriate affect. ECOG FS:1 - Symptomatic but completely ambulatory  LAB RESULTS:  CMP     Component Value Date/Time   NA 143 09/18/2013 1037   NA 138 09/26/2007 1206   K 3.8 09/18/2013 1037   K 3.6 09/26/2007 1206   CL 104 09/26/2007 1206   CO2 25 09/18/2013 1037   CO2 24 09/26/2007 1206   GLUCOSE 95 09/18/2013 1037   GLUCOSE 118* 09/26/2007 1206   BUN 14.2 09/18/2013 1037   BUN 8 09/26/2007 1206   CREATININE 0.9 09/18/2013 1037   CREATININE 0.78 09/26/2007 1206   CALCIUM 9.2 09/18/2013 1037   CALCIUM 9.1 09/26/2007 1206   PROT 6.1* 09/18/2013 1037   ALBUMIN 3.4* 09/18/2013 1037   AST 16 09/18/2013 1037   ALT 13 09/18/2013 1037   ALKPHOS 89 09/18/2013 1037   BILITOT 0.30 09/18/2013 1037   GFRNONAA >60 09/26/2007 1206   GFRAA  Value: >60        The eGFR has been calculated using the MDRD equation. This calculation has not been validated in all clinical 09/26/2007 1206   I No results found for this basename: SPEP,  UPEP,   kappa and lambda light chains    Lab Results  Component Value Date   WBC 7.4 09/18/2013   NEUTROABS 5.0 09/18/2013   HGB 13.1 09/18/2013   HCT 40.2 09/18/2013   MCV 85.5 09/18/2013   PLT 208 09/18/2013      Chemistry      Component Value Date/Time   NA 143 09/18/2013 1037   NA 138 09/26/2007 1206   K 3.8 09/18/2013 1037   K 3.6 09/26/2007 1206   CL 104 09/26/2007 1206   CO2 25 09/18/2013 1037   CO2 24 09/26/2007 1206   BUN 14.2 09/18/2013 1037   BUN 8 09/26/2007 1206   CREATININE 0.9 09/18/2013 1037   CREATININE 0.78 09/26/2007 1206      Component Value Date/Time   CALCIUM 9.2 09/18/2013 1037   CALCIUM 9.1 09/26/2007 1206    ALKPHOS 89 09/18/2013 1037   AST 16 09/18/2013 1037   ALT 13 09/18/2013 1037   BILITOT 0.30 09/18/2013 1037       No results found for this basename: LABCA2    No components found with this basename: LABCA125    No results found for this basename: INR,  in the last 168 hours  Urinalysis No results found for this basename: colorurine,  appearanceur,  labspec,  phurine,  glucoseu,  hgbur,  bilirubinur,  ketonesur,  proteinur,  urobilinogen,  nitrite,  leukocytesur    STUDIES: Dg Chest 1 View  09/05/2013   CLINICAL DATA:  post left sided thoracentesis 1.2 liters  EXAM: CHEST - 1 VIEW  COMPARISON:  US THORACENTESIS ASP PLEURAL SPACE W/IMG GUIDE dated 09/05/2013; DG CHEST 1 VIEW dated 07/21/2013; CT CHEST W/CM dated 08/29/2013  FINDINGS: The cardiac silhouette is within normal limits. There is no evidence of a pneumothorax status post thoracentesis. A small residual left effusion is appreciated. There is diffuse mild increased density in the left lung base may reflect a hypoventilation. The lungs otherwise clear. Surgical clips are appreciated within the left axilla. No acute osseous abnormalities. A moderate to large hiatal hernia is appreciated.  IMPRESSION: No evidence of a pneumothorax  Small residual left pleural effusion  Possibly hypoventilation within the left lung base   Electronically Signed   By: Margaree Mackintosh M.D.   On: 09/05/2013 12:07   Ct Chest W Contrast  08/29/2013   CLINICAL DATA:  Breast cancer. Malignant pleural effusion. Restaging.  EXAM: CT CHEST, ABDOMEN, AND PELVIS WITH CONTRAST  TECHNIQUE: Multidetector CT imaging of the chest, abdomen and pelvis was performed following the standard protocol during bolus administration of intravenous contrast.  CONTRAST:  79m OMNIPAQUE IOHEXOL 300 MG/ML  SOLN  COMPARISON:  None.  FINDINGS: CT CHEST FINDINGS  Large left pleural effusion. Areas of pleural enhancement noted posteriorly and medially with pleural thickening (see image 32). Other  areas of pleural thickening and enhancement noted as well. Masslike pleural thickening noted at the left base on image 57 and anteriorly on image 53.  Heart is normal size. Aorta is normal caliber. Extensive irregular plaque throughout the descending thoracic aorta. Moderate to large hiatal hernia.  Mildly enlarged right hilar lymph node measuring 11 mm in short axis diameter on image 24. Left internal mammary lymph node with central necrosis on image 29 has a short axis diameter of 17 mm. There is adjacent anterior pleural thickening/enhancement on image 24. Left lower cervical/supraclavicular nodal mass on image 7 has a short axis diameter of 2.7 cm. Left axillary adenopathy noted. Index node has a short axis diameter of 14 mm on image 18.  Review of lung windows demonstrates compressive atelectasis in the left lower lobe. Triangular density noted at the left lung base anteriorly on image 48. Linear interstitial thickening noted anteriorly in the left upper lobe, likely postradiation changes. Mild underlying centrilobular emphysema. No suspicious pulmonary nodules.  No definite focal osseous lesion. Degenerative changes in the thoracic spine. Probable hemangiomas within the lower thoracic and upper lumbar vertebral bodies.  CT ABDOMEN AND PELVIS FINDINGS  Large left periaortic mass contiguous with the left adrenal gland, left diaphragmatic hiatus measuring 4.3 x 4.0 cm on image 61 of series 2. Right periaortic abnormal soft tissue represents retrocrural adenopathy on this image 62 measuring measuring 2.6 x 1.3 cm. Retrocrural adenopathy on image 54 measures 2.0 x 1.5 cm other mildly enlarged retroperitoneal lymph nodes.  Hypodensity anteriorly within the liver measures 15 mm in diameter and is nonspecific. Ill-defined/irregular hypodense, likely solid area within the spleen measures up to 4.8 cm on image 58. Other scattered smaller hypodense areas throughout the spleen. Pancreas, right adrenal, kidneys are  unremarkable. Small cysts in the mid and lower pole of the left kidney. No hydronephrosis.  Stomach, small bowel unremarkable. Scattered descending colonic and sigmoid diverticulosis. Uterus, adnexae and urinary bladder unremarkable. No free fluid or free air.  Aneurysmal dilatation of the abdominal aorta measuring up to 3.2 cm. Irregular plaque throughout the abdominal aorta.  Trace free fluid in the pelvis.  Degenerative changes in the lumbar spine. Lucent areas with coarsened trabeculae noted in the upper and mid lumbar spine, felt to most likely represent hemangiomas.  IMPRESSION: Large left pleural effusion with areas of pleural thickening and enhancement throughout the left hemithorax, likely pleural metastatic disease.  Adenopathy in the left internal mammary chain and left lower cervical region. Left axillary adenopathy. Right hilar adenopathy.  Mild underlying centrilobular emphysema.  Large hiatal hernia.  Retrocrural adenopathy and retroperitoneal adenopathy.  Large conglomerate mass in the region of the left adrenal gland and left periaortic region compatible with metastasis.  Large irregular hypodense dense area within the spleen with smaller scattered hypodense areas. Findings concerning for metastatic disease. 15  mm hypodensity anteriorly in the liver is nonspecific.  Lucent areas with coarsened trabeculae noted in the lower thoracic, upper and mid lumbar spine. Favor hemangiomas. These could be further evaluated with nuclear medicine whole-body bone scan.   Electronically Signed   By: Rolm Baptise M.D.   On: 08/29/2013 11:57   Nm Pet Image Restag (ps) Skull Base To Thigh  08/29/2013   CLINICAL DATA:  Subsequent treatment strategy for breast carcinoma. Malignant pleural effusion.  EXAM: NUCLEAR MEDICINE PET SKULL BASE TO THIGH  TECHNIQUE: 7.7 mCi F-18 FDG was injected intravenously. Full-ring PET imaging was performed from the skull base to thigh after the radiotracer. CT data was obtained and used  for attenuation correction and anatomic localization.  FASTING BLOOD GLUCOSE:  Value: 8 5 mg/dl  COMPARISON:  None available  FINDINGS: NECK  Hypermetabolic left supraclavicular lymph node measures 17 mm short axis with SUV max equals 6.5. Hypermetabolic high left axillary lymph node measures 16 mm short axis (image 23) of fused series) with SUV max equal 8.8. There is a hypermetabolic prevascular lymph node and left hilar lymph nodes. There is a large left pleural effusion.  CHEST  No hypermetabolic mediastinal or hilar nodes. No suspicious pulmonary nodules on the CT scan.  ABDOMEN/PELVIS  There is a hypermetabolic left periaortic retroperitoneal mass measuring 4.3 x 3.2 cm with SUV max equal 8.6. This is likely a conglomerate of lymph nodes. Hypermetabolic retrocrural lymph nodes are also noted.  There is a hypermetabolic focus within the medial aspect of the spleen consistent metastasis (image 34). There small hypermetabolic periaortic lymph nodes just above the bifurcation. No hypermetabolic nodes in the iliac stations. Mild metabolic activity associated with a right inguinal lymph node  SKELETON  There are least 4 sites of hypermetabolic skeletal metastasis. Hypermetabolic focus in the proximal right humerus, image 10, with SUV max 3.9. There is hypermetabolic focus within the mid thoracic spine atT8 with SUV max 5.7. No clear lesion on CT portion. Intense metabolic lesion within the L3 vertebral body with a associated sclerotic lesion on CT and SUV max 9.4. Additional subtle metastasis within the right ischium.  IMPRESSION: 1. Hypermetabolic nodal metastasis in the left supraclavicular and left axillary nodal stations. 2. Hypermetabolic mediastinal and left hilar metastasis. 3. Bulky hypermetabolic left periaortic nodal mass. 4. Hypermetabolic solitary splenic metastasis. 5. Hypermetabolic skeletal metastasis involving the right humerus, thoracic spine, lumbar spine, and pelvis. 6. Large left effusion.    Electronically Signed   By: Suzy Bouchard M.D.   On: 08/29/2013 12:38   US Thoracentesis Asp Pleural Space W/img Guide  09/05/2013   CLINICAL DATA:  Metastatic breast cancer, left pleural effusion, shortness of breath, request for thoracentesis.  EXAM: ULTRASOUND GUIDED left THORACENTESIS  COMPARISON:  None.  PROCEDURE: An ultrasound guided thoracentesis was thoroughly discussed with the patient and questions answered. The benefits, risks, alternatives and complications were also discussed. The patient understands and wishes to proceed with the procedure. Written consent was obtained.  Ultrasound was performed to localize and mark an adequate pocket of fluid in the left chest. The area was then prepped and draped in the normal sterile fashion. 1% Lidocaine was used for local anesthesia. Under ultrasound guidance a 19 gauge Yueh catheter was introduced. Thoracentesis was performed. The catheter was removed and a dressing applied.  Complications:  None.  FINDINGS: A total of approximately 1.2 liters of clear yellow fluid was removed. A fluid sample was not sent for laboratory analysis.  IMPRESSION: Successful ultrasound guided  left thoracentesis yielding 1.2 liters of pleural fluid.  Read By:  Tsosie Billing PA-C   Electronically Signed   By: Aletta Edouard M.D.   On: 09/05/2013 11:26    ASSESSMENT: 80 y.o. [place] woman with a history of bilateral breast cancer, now stage IV, as follows  (1) LEFT BREAST:  (a) status post left excisional biopsy 05/14/2000 for a pT2 NX, stage II invasive lobular breast cancer, estrogen receptor 83% positive, progesterone receptor 90% positive, HER-2 negative, with positive margins   (b) left breast reexcision for margin clearance, with left axillary lymph node dissection 05/31/2000, with negative margins but 5/15 axillary lymph nodes involved, final stage pT2 pN2, stage IIIA  (c) status post radiation to the left breast (not to axilla)  (d) on tamoxifen approximately 5  years per patient report  (2) RIGHT BREAST:  (a) status post right lumpectomy and sentinel lymph node sampling 05/272009 for a pT1c pN0, stage IA invasive ductal carcinoma, grade 1, estrogen receptor 99% positive, progesterone receptor and 98% positive, with an MIB-1 of 11%, and no HER-2 amplification.  (b) status post radiation to the right breast completed August 2009  (c) the patient refused antiestrogen therapy and medical oncology evaluation  (3) METASTATIC DISEASE:  (a) staging studies March and April 2015 show extensive metastatic adenopathy (left supraclavicular and left axillary,.  retrocrural, etc.), with splenic involvement, and with significant left pleural involvement and a large left pleural effusion; bone involvement was questionable  (b) left thoracentesis 07/21/2013 confirms a malignant effusion, with adenocarcinoma cells positive for estrogen and progesterone receptor, negative for HER-2 amplification  (c) fulvestrant started 09/04/2013  (4) healing hematoma on face due to fall   PLAN: Recommend that we go ahead with  Faslodex injection today. She had questions about whether or not she needs repeat thoracentesis; however her lungs sound clear and she is not short of breath at this time. I have emphasized to her and her friend, if she becomes short of breath to call us and we can certainly have her evaluated. I do not think she needs one at this time.   Followup in a month, with physician and for her injection.    Multiple questions answered.  I spent 25 minutes face-to-face with patient with more than 50% spent on counseling.   Burns Spain, AOCNP, NP-C   11/03/2013 5:15 PM

## 2013-11-04 ENCOUNTER — Ambulatory Visit: Payer: Medicare Other

## 2013-11-04 ENCOUNTER — Telehealth: Payer: Self-pay | Admitting: Adult Health

## 2013-11-04 ENCOUNTER — Ambulatory Visit: Payer: Medicare Other | Admitting: Oncology

## 2013-11-04 NOTE — Telephone Encounter (Signed)
Called pt to advise of Per 06/26 POF added lab/ov

## 2013-11-10 ENCOUNTER — Encounter: Payer: Self-pay | Admitting: *Deleted

## 2013-11-10 ENCOUNTER — Encounter: Payer: Self-pay | Admitting: Oncology

## 2013-11-10 NOTE — Progress Notes (Signed)
[place] Work  Clinical Social Work was referred by patient for assessment of psychosocial needs due to billing concerns.  Pt appeared in [facility] office and states she was referred by chaplain. Clinical Education officer, museum met with patient briefly, but learned the issue was specific to billing concerns and possible need for financial advocate to assist. CSW called and walked pt down to meet with Raquel and there she learned that she needed to call the billing department. CSW had spoken on the phone with pt in the past and educated her on who to contact. Pt observed with large bruise on her face and states that she walked into a wall in the dark. Pt denies other concerns currently, reports she has good supports and transportation. Her main concern is her bills through [facility].      Clinical Social Work interventions: Problem Building control surveyor education  Loren Racer, [facility] Clinical Social Worker [facility] for [facility] Wednesday, Thursday and Friday Phone: [phone] Fax: [phone]

## 2013-11-10 NOTE — Progress Notes (Signed)
Advised the patient she would need to call billing to set up pmts for her bills. I advised her to make sure she checks that insurance has been filed and paid. She wanted a grant to help her pay bills. I advised her our grants pays bills other than [facility]. You can't use a [facility] grant to pay a [employer]. All her bills she had are from [facility]. All billing inquires go to billing.

## 2013-11-23 NOTE — Progress Notes (Signed)
[place] NOTE  Patient Care Team: [name], MD as PCP - General (Internal Medicine)  CHIEF COMPLAINTS/PURPOSE OF CONSULTATION:  Breast cancer  HISTORY OF PRESENTING ILLNESS:  Brianna Duke 80 y.o. female with prior hx of right breast cancer s/p lumpectomy and RT completed in 12/14/2007. In March 2015 patient was noted to have left sided supraclaviculasr lymphadenopathy. CT chest showed lymphadenopathy and left pleural effusion, left adrenal mass. S/p left thoracentesis with the pleural fluid showing adenocarcinoma c/w breast primary. ER+PR+ and Her2Neu-. Patient now in medical oncology for further evaluation.  MEDICAL HISTORY:  Past Medical History  Diagnosis Date  . Hyperlipidemia   . Bronchitis     history  . Retinal detachment     history of  . COPD (chronic obstructive pulmonary disease)   . Meniere's disease     surgery  . History of radiation therapy 06/15/00- 08/22/00    left breast, 5940 cGy 33 fractions  . Hx of radiation therapy 11/18/07- 01/02/08    right breast 2880 cGy 16 fractions, right breast 2000 cGy 10 fractions, right breast boost 1400 cGy 7 fractions  . Anxiety   . Varicose veins   . Hypertension   . Thyroid disease     hypothyroid  . Insomnia   . Chronic kidney disease     stage 2  . Breast cancer 05/31/00     left lower outer   . Malignant neoplasm of breast (female), unspecified site 10/02/07    right 12 o'clock , invasive ductal  . Skin cancer     squamous cell- face  . Baker's cyst of knee     right knee  . Metastases to the liver     breast primary  . 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 Metastasis to adrenal gland   . Pleural effusion, malignant 07/21/13    left lung, breast primary  . Cataract     surgery onboth  . Depression     SURGICAL HISTORY: Past Surgical History  Procedure Laterality Date  . Appendectomy      80 yrs old  . Pituitary surgery  2005    adenoma removed  . Thyroidectomy, partial  1991  .  Tonsillectomy      age 40    SOCIAL HISTORY: History   Social History  . Marital Status: Divorced    Spouse Name: N/A    Number of Children: N/A  . Years of Education: N/A   Occupational History  . Not on file.   Social History Main Topics  . Smoking status: Current Every Day Smoker -- 1.00 packs/day for 30 years    Types: Cigarettes  . Smokeless tobacco: Not on file  . Alcohol Use: Yes     Comment: rarely  . Drug Use: No  . Sexual Activity: No     Comment: P0, menopause age 28   Other Topics Concern  . Not on file   Social History Narrative  . No narrative on file    FAMILY HISTORY: Family History  Problem Relation Age of Onset  . Cancer Mother   . Cancer Sister     ovarian  . Cancer Sister 36    breast    ALLERGIES:  has No Known Allergies.  MEDICATIONS:  Current Outpatient Prescriptions  Medication Sig Dispense Refill  . levothyroxine (SYNTHROID, LEVOTHROID) 75 MCG tablet Take 75 mcg by mouth daily before breakfast.      . LORazepam (ATIVAN) 1 MG tablet Take 1  mg by mouth 2 (two) times daily as needed for anxiety.      . naproxen sodium (ANAPROX) 220 MG tablet Take 220 mg by mouth 2 (two) times daily as needed.       . Omega-3 Fatty Acids (FISH OIL) 1000 MG CAPS Take 2 capsules by mouth daily.      . Vitamin D, Ergocalciferol, (DRISDOL) 50000 UNITS CAPS capsule Take 1,000 Units by mouth daily.       No current facility-administered medications for this visit.    REVIEW OF SYSTEMS:   Constitutional: Denies fevers, chills or abnormal night sweats, c/o of weakness and fatigue. Eyes: Denies blurriness of vision, double vision or watery eyes Ears, nose, mouth, throat, and face: Denies mucositis or sore throat Respiratory: Denies cough, dyspnea or wheezes Cardiovascular: Denies palpitation, chest discomfort or lower extremity swelling Gastrointestinal:  Denies nausea, heartburn or change in bowel habits Skin: Denies abnormal skin rashes Lymphatics: Denies  new lymphadenopathy or easy bruising Neurological:Denies numbness, tingling or new weaknesses Behavioral/Psych: Mood is stable, no new changes  All other systems were reviewed with the patient and are negative.  PHYSICAL EXAMINATION: ECOG PERFORMANCE STATUS: 1 - Symptomatic but completely ambulatory  Filed Vitals:   08/25/13 1251  BP: 125/77  Pulse: 81  Temp: 97.5 F (36.4 C)  Resp: 18   Filed Weights   08/25/13 1251  Weight: 126 lb 6.4 oz (57.335 kg)    Ocular: Sclerae unicteric, EOMs intact  Ear-nose-throat: Oropharynx clear and moist  Lymphatic: Left supraclavicular adenopathy measuring approximately 3 cm, firm and fixed  Lungs no rales or rhonchi, g decreased breath sounds at the left base  Heart regular rate and rhythm, no murmur appreciated  Abd soft, nontender, positive bowel sounds  MSK no focal spinal tenderness, no upper extremity lymphedema  Neuro: non-focal, well-oriented, guarded affect  Breasts: Both breasts are status post lumpectomy and radiation. I do not see clear evidence of local recurrence. Both axillae are benign    ASSESSMENT & PLAN:  80 y/o with recurrent breast cancer, stage IV. Recommend further staging studies. Patient is recommended treatment with anti-estrogen therapy. However I would like to see her staging studies before making further plans. She will be seen back after completion of her staging studies.   All questions were answered. The patient knows to call the clinic with any problems, questions or concerns.     Marcy Panning, MD

## 2013-11-27 ENCOUNTER — Ambulatory Visit: Payer: Medicare Other

## 2013-12-02 ENCOUNTER — Ambulatory Visit (HOSPITAL_BASED_OUTPATIENT_CLINIC_OR_DEPARTMENT_OTHER): Payer: Medicare Other

## 2013-12-02 ENCOUNTER — Encounter: Payer: Self-pay | Admitting: Adult Health

## 2013-12-02 ENCOUNTER — Other Ambulatory Visit (HOSPITAL_BASED_OUTPATIENT_CLINIC_OR_DEPARTMENT_OTHER): Payer: Medicare Other

## 2013-12-02 ENCOUNTER — Ambulatory Visit (HOSPITAL_BASED_OUTPATIENT_CLINIC_OR_DEPARTMENT_OTHER): Payer: Medicare Other | Admitting: Adult Health

## 2013-12-02 ENCOUNTER — Ambulatory Visit: Payer: Medicare Other

## 2013-12-02 VITALS — BP 131/83 | HR 75 | Temp 97.6°F | Resp 18 | Ht 63.0 in | Wt 123.0 lb

## 2013-12-02 DIAGNOSIS — C770 Secondary and unspecified malignant neoplasm of lymph nodes of head, face and neck: Secondary | ICD-10-CM

## 2013-12-02 DIAGNOSIS — Z170 Estrogen receptor positive status [ER+]: Secondary | ICD-10-CM | POA: Diagnosis not present

## 2013-12-02 DIAGNOSIS — C50919 Malignant neoplasm of unspecified site of unspecified female breast: Secondary | ICD-10-CM | POA: Diagnosis not present

## 2013-12-02 DIAGNOSIS — F419 Anxiety disorder, unspecified: Secondary | ICD-10-CM

## 2013-12-02 DIAGNOSIS — C782 Secondary malignant neoplasm of pleura: Secondary | ICD-10-CM

## 2013-12-02 DIAGNOSIS — C50911 Malignant neoplasm of unspecified site of right female breast: Secondary | ICD-10-CM

## 2013-12-02 DIAGNOSIS — J910 Malignant pleural effusion: Secondary | ICD-10-CM

## 2013-12-02 DIAGNOSIS — C773 Secondary and unspecified malignant neoplasm of axilla and upper limb lymph nodes: Secondary | ICD-10-CM | POA: Diagnosis not present

## 2013-12-02 DIAGNOSIS — Z5111 Encounter for antineoplastic chemotherapy: Secondary | ICD-10-CM

## 2013-12-02 DIAGNOSIS — C7889 Secondary malignant neoplasm of other digestive organs: Secondary | ICD-10-CM

## 2013-12-02 DIAGNOSIS — C50912 Malignant neoplasm of unspecified site of left female breast: Secondary | ICD-10-CM

## 2013-12-02 LAB — CBC WITH DIFFERENTIAL/PLATELET
BASO%: 0.9 % (ref 0.0–2.0)
Basophils Absolute: 0.1 10*3/uL (ref 0.0–0.1)
EOS ABS: 0.2 10*3/uL (ref 0.0–0.5)
EOS%: 2.8 % (ref 0.0–7.0)
HCT: 40.5 % (ref 34.8–46.6)
HGB: 13.4 g/dL (ref 11.6–15.9)
LYMPH%: 30.4 % (ref 14.0–49.7)
MCH: 29 pg (ref 25.1–34.0)
MCHC: 33.1 g/dL (ref 31.5–36.0)
MCV: 87.6 fL (ref 79.5–101.0)
MONO#: 0.5 10*3/uL (ref 0.1–0.9)
MONO%: 7 % (ref 0.0–14.0)
NEUT%: 58.9 % (ref 38.4–76.8)
NEUTROS ABS: 4.1 10*3/uL (ref 1.5–6.5)
PLATELETS: 240 10*3/uL (ref 145–400)
RBC: 4.63 10*6/uL (ref 3.70–5.45)
RDW: 15 % — ABNORMAL HIGH (ref 11.2–14.5)
WBC: 7 10*3/uL (ref 3.9–10.3)
lymph#: 2.1 10*3/uL (ref 0.9–3.3)

## 2013-12-02 LAB — COMPREHENSIVE METABOLIC PANEL (CC13)
ALK PHOS: 80 U/L (ref 40–150)
ALT: 12 U/L (ref 0–55)
AST: 16 U/L (ref 5–34)
Albumin: 3.6 g/dL (ref 3.5–5.0)
Anion Gap: 10 mEq/L (ref 3–11)
BUN: 10.8 mg/dL (ref 7.0–26.0)
CO2: 25 mEq/L (ref 22–29)
Calcium: 9.4 mg/dL (ref 8.4–10.4)
Chloride: 104 mEq/L (ref 98–109)
Creatinine: 0.9 mg/dL (ref 0.6–1.1)
GLUCOSE: 86 mg/dL (ref 70–140)
Potassium: 4.1 mEq/L (ref 3.5–5.1)
Sodium: 139 mEq/L (ref 136–145)
Total Bilirubin: 0.47 mg/dL (ref 0.20–1.20)
Total Protein: 6.4 g/dL (ref 6.4–8.3)

## 2013-12-02 MED ORDER — FULVESTRANT 250 MG/5ML IM SOLN
500.0000 mg | Freq: Once | INTRAMUSCULAR | Status: AC
  Administered 2013-12-02: 500 mg via INTRAMUSCULAR
  Filled 2013-12-02: qty 10

## 2013-12-02 MED ORDER — LORAZEPAM 1 MG PO TABS: 1.0000 mg | ORAL_TABLET | Freq: Two times a day (BID) | ORAL | Status: DC | PRN

## 2013-12-02 NOTE — Progress Notes (Signed)
[place]  Telephone:([phone] Fax:(336) [phone]     ID: Brianna Duke OB: 11/28/1927  MR#: [id number]  MHW#:[id number]  PCP: [name], MD GYN:   SU:  OTHER MD: [name], james crossley  CHIEF COMPLAINT: Metastatic breast cancer  BREAST CANCER HISTORY: From [name] intake note dated 11/05/2007:  "She presented to Dr. [name] back in February for a complete physical exam.  She has a history of breast cancer treated back in 1992 by Dr. [name].  She had had a left lumpectomy by Dr. [name] for a reasonably large lesion, although I do not have any of the detail and she also had lymph nodes involved at that time, which I believe, 5 of 15 nodes were involved.  Pathology department no longer has this information.  She did have external beam radiation therapy for this.  We are awaiting that information as well.  For her complete physical, he recommended screening mammogram.  This was performed March 3 and it showed a possible mass noted in the right breast.  Spot compression views and possibly ultrasound were recommended for further evaluation.  Left breast was unremarkable.  She came back for that on March 27 and this showed that the question small mass in the right breast at the 12 o'clock position persisted measuring 4 cm from the nipple, a 0.76 x 0.9 cm hypoechoic mass correlating to the mammographic findings.  They recommended further workup.  On March 30, she had a core biopsy and this showed invasive ductal carcinoma ER/PR positive, HER-2/neu negative.  She went on to have an MRI of the bilateral breasts on April 9 and it showed a 1.1 x 0.8 x 0.5 known invasive ductal carcinoma at the 12 o'clock position of the right breast with an adjacent 3 mm possible satellite lesion immediately adjacent to the posteroinferior aspect of this mass.  There were multiple additional right breast nodules 4 mm or less in maximum diameter most likely representing  normal background nodular parenchyma.  None of these were large enough to warrant a biopsy at the time.  There was no background parenchymal nodularity or enhancement in the left breast possibly due to previous radiation.  There was asymmetrical and mildly prominent right axillary and retropectoral lymph nodes and a second look ultrasound of the right axilla was recommended.  She was given an ultrasound appointment at 11 a.m. on April 10; however, she called in and canceled that appointment saying she does not want to reschedule.  She told me that she had gone up to [place] and was considering not having any kind of treatment at all.  She had multiple small liver masses also noted on MRI, which were too small to accurately characterize on the current images, moderate-sized hiatal hernia.  She then finally returned to town and agreed to have surgery and chest x-ray on May 21, showed slight hyperinflation but no acute process seen.  She went on to have her lumpectomy and sentinel node biopsy by [name] on May 27.  This showed a 1.1 cm invasive ductal carcinoma grade 1 with low-grade DCIS with the DCIS margin being less than 0.1 cm in the lateral superficial resection margins.  Two sentinel nodes were negative.  [name] discussed with her the need for radiation and has sent her here for our opinion regarding the role of radiation.  He also discussed with her hormone receptor status being positive, but the patient declined taking any sort of medication or even  having a medical oncology referral."  The patient did complete radiation to the right breast 12/14/2007, with a boost to a total dose of 6280 cGy . She was subsequently lost to followup.  More recently the patient noted left sided supraclavicular adenopathy. She brought this to the attention of Dr. [name] who obtained a chest CT 07/16/2013 at [facility]. This showed left supraclavicular and anterior mediastinal adenopathy, a left-sided pleural  effusion on with evidence of left-sided pleural disease, a left adrenal mass and probable retroperitoneal adenopathy, several splenic lesions, some right retrocrural adenopathy, nonspecific liver lesions and also evidence of aortic atherosclerosis and emphysema. On 07/21/2013 the patient underwent left thoracentesis, in the pleural fluid showed ([id number]) malignant cells consistent with breast adenocarcinoma, estrogen receptor 35% positive, progesterone receptor 22% positive, with no HER-2 amplification, the signals ratio being 1.40 and the number per cell 1.75.  Her subsequent history is as detailed below.  INTERVAL HISTORY: Brianna Duke is here today for evaluation prior to receiving her Faslodex.  She is doing well today.  She is tolerating faslodex well.  She does feel "a little bit weak" following the injection for about 2-3 days.  She takes Ativan if needed for anxiety and to help her sleep.  She is requesting a refill on this today.  She denies hot flashes, joint aches, vision changes, headaches, fevers, chills, nausea, vomiting, constipation,  Bladder changes, numbness, or any further concerns.     PAST MEDICAL HISTORY: Past Medical History  Diagnosis Date  . Hyperlipidemia   . Bronchitis     history  . Retinal detachment     history of  . COPD (chronic obstructive pulmonary disease)   . Meniere's disease     surgery  . History of radiation therapy 06/15/00- 08/22/00    left breast, 5940 cGy 33 fractions  . Hx of radiation therapy 11/18/07- 01/02/08    right breast 2880 cGy 16 fractions, right breast 2000 cGy 10 fractions, right breast boost 1400 cGy 7 fractions  . Anxiety   . Varicose veins   . Hypertension   . Thyroid disease     hypothyroid  . Insomnia   . Chronic kidney disease     stage 2  . Breast cancer 05/31/00     left lower outer   . Malignant neoplasm of breast (female), unspecified site 10/02/07    right 12 o'clock , invasive ductal  . Skin cancer     squamous cell- face   . Baker's cyst of knee     right knee  . Metastases to the liver     breast primary  . 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 Metastasis to adrenal gland   . Pleural effusion, malignant 07/21/13    left lung, breast primary  . Cataract     surgery onboth  . Depression     PAST SURGICAL HISTORY: Past Surgical History  Procedure Laterality Date  . Appendectomy      80 yrs old  . Pituitary surgery  2005    adenoma removed  . Thyroidectomy, partial  1991  . Tonsillectomy      age 39    FAMILY HISTORY Family History  Problem Relation Age of Onset  . Cancer Mother   . Cancer Sister     ovarian  . Cancer Sister 62    breast   the patient's father died at the age of 58 in a tractor accident. The patient is one of 9 siblings. Only at 2 siblings  survive in addition to her. One is her brother Brianna Duke who lives in [place], and sister Alisia Ferrari who lives in [place].Marland KitchenHe can be reached at [phone].   GYNECOLOGIC HISTORY:  Menarche age 59. The patient is GX P0. 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 She moved to [place] from [place] around that time. She is single. She lives alone, with no pets. She attends the good [employer]. In addition to her siblings, listed above, she frequently visits her niece Meryl Dare in [place]. She can be reached at [phone], and her friend Renne Musca who lives in [place].  ADVANCED DIRECTIVES: Not in place; on her 09/04/2013 visit the patient was given healthcare power of attorney and living will documents to complete and notarize at her discretion   HEALTH MAINTENANCE: History  Substance Use Topics  . Smoking status: Current Every Day Smoker -- 1.00 packs/day for 30 years    Types: Cigarettes  . Smokeless tobacco: Not on file  . Alcohol Use:  Yes     Comment: rarely   No Known Allergies  Current Outpatient Prescriptions  Medication Sig Dispense Refill  . levothyroxine (SYNTHROID, LEVOTHROID) 75 MCG tablet Take 75 mcg by mouth daily before breakfast.      . LORazepam (ATIVAN) 1 MG tablet Take 1 mg by mouth 2 (two) times daily as needed for anxiety.      . naproxen sodium (ANAPROX) 220 MG tablet Take 220 mg by mouth 2 (two) times daily as needed.       . Omega-3 Fatty Acids (FISH OIL) 1000 MG CAPS Take 2 capsules by mouth daily.      . Vitamin D, Ergocalciferol, (DRISDOL) 50000 UNITS CAPS capsule Take 1,000 Units by mouth daily.       No current facility-administered medications for this visit.    OBJECTIVE: Older white woman who appears stated age with extensive bruising to right side of face Filed Vitals:   12/02/13 1024  BP: 131/83  Pulse: 75  Temp: 97.6 F (36.4 C)  Resp: 18     Body mass index is 21.79 kg/(m^2).     GENERAL: Patient is older female in no acute distress.  HEENT: sclerae anicteric, neck is supple, atraumatic NODES:  No cervical, supraclavicular, or axillary lymphadenopathy palpated.  BREAST EXAM: s/p left lumpectomy, no nodularity, left breast and nipple does point downward and nipple is touching abdomen.  No new or suspicious areas noted.   LUNGS:  Clear to auscultation bilaterally.  No wheezes or rhonchi.No crackles. HEART:  Regular rate and rhythm. No murmur appreciated. ABDOMEN:  Soft, nontender.  Positive, normoactive bowel sounds. No organomegaly palpated. MSK:  No focal spinal tenderness to palpation. Full range of motion bilaterally in the upper extremities. EXTREMITIES:  No peripheral edema.   SKIN:  Clear with no obvious rashes or skin changes. No nail dyscrasia. NEURO:  Nonfocal. Well oriented.  Appropriate affect. ECOG FS:1 - Symptomatic but completely ambulatory  LAB RESULTS:  CMP     Component Value Date/Time   NA 143 09/18/2013 1037   NA 138 09/26/2007 1206   K 3.8 09/18/2013 1037    K 3.6 09/26/2007 1206   CL 104 09/26/2007 1206   CO2 25 09/18/2013 1037   CO2 24 09/26/2007 1206   GLUCOSE 95 09/18/2013 1037   GLUCOSE 118*  09/26/2007 1206   BUN 14.2 09/18/2013 1037   BUN 8 09/26/2007 1206   CREATININE 0.9 09/18/2013 1037   CREATININE 0.78 09/26/2007 1206   CALCIUM 9.2 09/18/2013 1037   CALCIUM 9.1 09/26/2007 1206   PROT 6.1* 09/18/2013 1037   ALBUMIN 3.4* 09/18/2013 1037   AST 16 09/18/2013 1037   ALT 13 09/18/2013 1037   ALKPHOS 89 09/18/2013 1037   BILITOT 0.30 09/18/2013 1037   GFRNONAA >60 09/26/2007 1206   GFRAA  Value: >60        The eGFR has been calculated using the MDRD equation. This calculation has not been validated in all clinical 09/26/2007 1206   I No results found for this basename: SPEP,  UPEP,   kappa and lambda light chains    Lab Results  Component Value Date   WBC 7.0 12/02/2013   NEUTROABS 4.1 12/02/2013   HGB 13.4 12/02/2013   HCT 40.5 12/02/2013   MCV 87.6 12/02/2013   PLT 240 12/02/2013      Chemistry      Component Value Date/Time   NA 143 09/18/2013 1037   NA 138 09/26/2007 1206   K 3.8 09/18/2013 1037   K 3.6 09/26/2007 1206   CL 104 09/26/2007 1206   CO2 25 09/18/2013 1037   CO2 24 09/26/2007 1206   BUN 14.2 09/18/2013 1037   BUN 8 09/26/2007 1206   CREATININE 0.9 09/18/2013 1037   CREATININE 0.78 09/26/2007 1206      Component Value Date/Time   CALCIUM 9.2 09/18/2013 1037   CALCIUM 9.1 09/26/2007 1206   ALKPHOS 89 09/18/2013 1037   AST 16 09/18/2013 1037   ALT 13 09/18/2013 1037   BILITOT 0.30 09/18/2013 1037       No results found for this basename: LABCA2    No components found with this basename: LABCA125    No results found for this basename: INR,  in the last 168 hours  Urinalysis No results found for this basename: colorurine,  appearanceur,  labspec,  phurine,  glucoseu,  hgbur,  bilirubinur,  ketonesur,  proteinur,  urobilinogen,  nitrite,  leukocytesur    STUDIES: Dg Chest 1 View  09/05/2013   CLINICAL DATA:  post left sided  thoracentesis 1.2 liters  EXAM: CHEST - 1 VIEW  COMPARISON:  US THORACENTESIS ASP PLEURAL SPACE W/IMG GUIDE dated 09/05/2013; DG CHEST 1 VIEW dated 07/21/2013; CT CHEST W/CM dated 08/29/2013  FINDINGS: The cardiac silhouette is within normal limits. There is no evidence of a pneumothorax status post thoracentesis. A small residual left effusion is appreciated. There is diffuse mild increased density in the left lung base may reflect a hypoventilation. The lungs otherwise clear. Surgical clips are appreciated within the left axilla. No acute osseous abnormalities. A moderate to large hiatal hernia is appreciated.  IMPRESSION: No evidence of a pneumothorax  Small residual left pleural effusion  Possibly hypoventilation within the left lung base   Electronically Signed   By: Margaree Mackintosh M.D.   On: 09/05/2013 12:07   Ct Chest W Contrast  08/29/2013   CLINICAL DATA:  Breast cancer. Malignant pleural effusion. Restaging.  EXAM: CT CHEST, ABDOMEN, AND PELVIS WITH CONTRAST  TECHNIQUE: Multidetector CT imaging of the chest, abdomen and pelvis was performed following the standard protocol during bolus administration of intravenous contrast.  CONTRAST:  74m OMNIPAQUE IOHEXOL 300 MG/ML  SOLN  COMPARISON:  None.  FINDINGS: CT CHEST FINDINGS  Large left pleural effusion. Areas of pleural enhancement noted posteriorly and  medially with pleural thickening (see image 32). Other areas of pleural thickening and enhancement noted as well. Masslike pleural thickening noted at the left base on image 57 and anteriorly on image 53.  Heart is normal size. Aorta is normal caliber. Extensive irregular plaque throughout the descending thoracic aorta. Moderate to large hiatal hernia.  Mildly enlarged right hilar lymph node measuring 11 mm in short axis diameter on image 24. Left internal mammary lymph node with central necrosis on image 29 has a short axis diameter of 17 mm. There is adjacent anterior pleural thickening/enhancement on image  24. Left lower cervical/supraclavicular nodal mass on image 7 has a short axis diameter of 2.7 cm. Left axillary adenopathy noted. Index node has a short axis diameter of 14 mm on image 18.  Review of lung windows demonstrates compressive atelectasis in the left lower lobe. Triangular density noted at the left lung base anteriorly on image 48. Linear interstitial thickening noted anteriorly in the left upper lobe, likely postradiation changes. Mild underlying centrilobular emphysema. No suspicious pulmonary nodules.  No definite focal osseous lesion. Degenerative changes in the thoracic spine. Probable hemangiomas within the lower thoracic and upper lumbar vertebral bodies.  CT ABDOMEN AND PELVIS FINDINGS  Large left periaortic mass contiguous with the left adrenal gland, left diaphragmatic hiatus measuring 4.3 x 4.0 cm on image 61 of series 2. Right periaortic abnormal soft tissue represents retrocrural adenopathy on this image 62 measuring measuring 2.6 x 1.3 cm. Retrocrural adenopathy on image 54 measures 2.0 x 1.5 cm other mildly enlarged retroperitoneal lymph nodes.  Hypodensity anteriorly within the liver measures 15 mm in diameter and is nonspecific. Ill-defined/irregular hypodense, likely solid area within the spleen measures up to 4.8 cm on image 58. Other scattered smaller hypodense areas throughout the spleen. Pancreas, right adrenal, kidneys are unremarkable. Small cysts in the mid and lower pole of the left kidney. No hydronephrosis.  Stomach, small bowel unremarkable. Scattered descending colonic and sigmoid diverticulosis. Uterus, adnexae and urinary bladder unremarkable. No free fluid or free air.  Aneurysmal dilatation of the abdominal aorta measuring up to 3.2 cm. Irregular plaque throughout the abdominal aorta.  Trace free fluid in the pelvis.  Degenerative changes in the lumbar spine. Lucent areas with coarsened trabeculae noted in the upper and mid lumbar spine, felt to most likely represent  hemangiomas.  IMPRESSION: Large left pleural effusion with areas of pleural thickening and enhancement throughout the left hemithorax, likely pleural metastatic disease.  Adenopathy in the left internal mammary chain and left lower cervical region. Left axillary adenopathy. Right hilar adenopathy.  Mild underlying centrilobular emphysema.  Large hiatal hernia.  Retrocrural adenopathy and retroperitoneal adenopathy.  Large conglomerate mass in the region of the left adrenal gland and left periaortic region compatible with metastasis.  Large irregular hypodense dense area within the spleen with smaller scattered hypodense areas. Findings concerning for metastatic disease. 15 mm hypodensity anteriorly in the liver is nonspecific.  Lucent areas with coarsened trabeculae noted in the lower thoracic, upper and mid lumbar spine. Favor hemangiomas. These could be further evaluated with nuclear medicine whole-body bone scan.   Electronically Signed   By: Rolm Baptise M.D.   On: 08/29/2013 11:57   Nm Pet Image Restag (ps) Skull Base To Thigh  08/29/2013   CLINICAL DATA:  Subsequent treatment strategy for breast carcinoma. Malignant pleural effusion.  EXAM: NUCLEAR MEDICINE PET SKULL BASE TO THIGH  TECHNIQUE: 7.7 mCi F-18 FDG was injected intravenously. Full-ring PET imaging was performed from the  skull base to thigh after the radiotracer. CT data was obtained and used for attenuation correction and anatomic localization.  FASTING BLOOD GLUCOSE:  Value: 8 5 mg/dl  COMPARISON:  None available  FINDINGS: NECK  Hypermetabolic left supraclavicular lymph node measures 17 mm short axis with SUV max equals 6.5. Hypermetabolic high left axillary lymph node measures 16 mm short axis (image 23) of fused series) with SUV max equal 8.8. There is a hypermetabolic prevascular lymph node and left hilar lymph nodes. There is a large left pleural effusion.  CHEST  No hypermetabolic mediastinal or hilar nodes. No suspicious pulmonary nodules  on the CT scan.  ABDOMEN/PELVIS  There is a hypermetabolic left periaortic retroperitoneal mass measuring 4.3 x 3.2 cm with SUV max equal 8.6. This is likely a conglomerate of lymph nodes. Hypermetabolic retrocrural lymph nodes are also noted.  There is a hypermetabolic focus within the medial aspect of the spleen consistent metastasis (image 34). There small hypermetabolic periaortic lymph nodes just above the bifurcation. No hypermetabolic nodes in the iliac stations. Mild metabolic activity associated with a right inguinal lymph node  SKELETON  There are least 4 sites of hypermetabolic skeletal metastasis. Hypermetabolic focus in the proximal right humerus, image 10, with SUV max 3.9. There is hypermetabolic focus within the mid thoracic spine atT8 with SUV max 5.7. No clear lesion on CT portion. Intense metabolic lesion within the L3 vertebral body with a associated sclerotic lesion on CT and SUV max 9.4. Additional subtle metastasis within the right ischium.  IMPRESSION: 1. Hypermetabolic nodal metastasis in the left supraclavicular and left axillary nodal stations. 2. Hypermetabolic mediastinal and left hilar metastasis. 3. Bulky hypermetabolic left periaortic nodal mass. 4. Hypermetabolic solitary splenic metastasis. 5. Hypermetabolic skeletal metastasis involving the right humerus, thoracic spine, lumbar spine, and pelvis. 6. Large left effusion.   Electronically Signed   By: Suzy Bouchard M.D.   On: 08/29/2013 12:38   US Thoracentesis Asp Pleural Space W/img Guide  09/05/2013   CLINICAL DATA:  Metastatic breast cancer, left pleural effusion, shortness of breath, request for thoracentesis.  EXAM: ULTRASOUND GUIDED left THORACENTESIS  COMPARISON:  None.  PROCEDURE: An ultrasound guided thoracentesis was thoroughly discussed with the patient and questions answered. The benefits, risks, alternatives and complications were also discussed. The patient understands and wishes to proceed with the procedure.  Written consent was obtained.  Ultrasound was performed to localize and mark an adequate pocket of fluid in the left chest. The area was then prepped and draped in the normal sterile fashion. 1% Lidocaine was used for local anesthesia. Under ultrasound guidance a 19 gauge Yueh catheter was introduced. Thoracentesis was performed. The catheter was removed and a dressing applied.  Complications:  None.  FINDINGS: A total of approximately 1.2 liters of clear yellow fluid was removed. A fluid sample was not sent for laboratory analysis.  IMPRESSION: Successful ultrasound guided left thoracentesis yielding 1.2 liters of pleural fluid.  Read By:  Tsosie Billing PA-C   Electronically Signed   By: Aletta Edouard M.D.   On: 09/05/2013 11:26    ASSESSMENT: 80 y.o. [place] woman with a history of bilateral breast cancer, now stage IV, as follows  (1) LEFT BREAST:  (a) status post left excisional biopsy 05/14/2000 for a pT2 NX, stage II invasive lobular breast cancer, estrogen receptor 83% positive, progesterone receptor 90% positive, HER-2 negative, with positive margins   (b) left breast reexcision for margin clearance, with left axillary lymph node dissection 05/31/2000, with negative margins  but 5/15 axillary lymph nodes involved, final stage pT2 pN2, stage IIIA  (c) status post radiation to the left breast (not to axilla)  (d) on tamoxifen approximately 5 years per patient report  (2) RIGHT BREAST:  (a) status post right lumpectomy and sentinel lymph node sampling 05/272009 for a pT1c pN0, stage IA invasive ductal carcinoma, grade 1, estrogen receptor 99% positive, progesterone receptor and 98% positive, with an MIB-1 of 11%, and no HER-2 amplification.  (b) status post radiation to the right breast completed August 2009  (c) the patient refused antiestrogen therapy and medical oncology evaluation  (3) METASTATIC DISEASE:  (a) staging studies March and April 2015 show extensive metastatic adenopathy  (left supraclavicular and left axillary,.  retrocrural, etc.), with splenic involvement, and with significant left pleural involvement and a large left pleural effusion; bone involvement was questionable  (b) left thoracentesis 07/21/2013 confirms a malignant effusion, with adenocarcinoma cells positive for estrogen and progesterone receptor, negative for HER-2 amplification  (c) fulvestrant started 09/04/2013  PLAN: Xavia is doing well today.  Her lab work is stable, I reviewed this with her in detail.  She will proceed with Faslodex today.  She is tolerating the Faslodex well with really no complaints or side effects that she notices.  I also refilled her Lorazepam.    She did complain about not getting to see Dr. Jana Hakim.  I requested f/u with him in one month prior to her Faslodex.  I encouraged her to return to Dr. Lorenda Hatchet to discuss him refilling her Thyroid medication.     Followup in a month, with physician and for her injection.   She knows to call us in the interim for any questions or concerns.  We can certainly see her sooner if needed.  I spent 25 minutes counseling the patient face to face.  The total time spent in the appointment was 30 minutes.    Minette Headland, [place] [phone] 12/02/2013 10:31 AM

## 2013-12-02 NOTE — Patient Instructions (Addendum)
You are doing well today.  You will proceed with Faslodex.  Please f/u with Dr. Lorenda Hatchet about your thyroid medication.  We will see you back in 1 month for labs and an appointment with Dr. Jana Hakim.

## 2013-12-03 ENCOUNTER — Telehealth: Payer: Self-pay | Admitting: *Deleted

## 2013-12-03 NOTE — Telephone Encounter (Signed)
Called Rx into pharmacy. Lorazepam 1 mg, Disp - 30, Refill - 0 per Charlestine Massed, NP. Message to be forwarded to Charlestine Massed, NP.

## 2013-12-05 ENCOUNTER — Telehealth: Payer: Self-pay | Admitting: Oncology

## 2013-12-05 ENCOUNTER — Encounter: Payer: Self-pay | Admitting: *Deleted

## 2013-12-06 ENCOUNTER — Telehealth: Payer: Self-pay | Admitting: Oncology

## 2013-12-06 NOTE — Telephone Encounter (Signed)
lvm for pt regarding to Aug appt....mailed pt appt sched and letter

## 2013-12-11 ENCOUNTER — Telehealth: Payer: Self-pay

## 2013-12-11 NOTE — Telephone Encounter (Signed)
Let pt know 6/72 appt duplicate.  She only needs ot be here on 8/28.  Pt voiced understanding.  POF sent

## 2013-12-24 DIAGNOSIS — F17200 Nicotine dependence, unspecified, uncomplicated: Secondary | ICD-10-CM | POA: Diagnosis not present

## 2013-12-24 DIAGNOSIS — Z1331 Encounter for screening for depression: Secondary | ICD-10-CM | POA: Diagnosis not present

## 2013-12-24 DIAGNOSIS — C50919 Malignant neoplasm of unspecified site of unspecified female breast: Secondary | ICD-10-CM | POA: Diagnosis not present

## 2013-12-24 DIAGNOSIS — E039 Hypothyroidism, unspecified: Secondary | ICD-10-CM | POA: Diagnosis not present

## 2013-12-24 DIAGNOSIS — E785 Hyperlipidemia, unspecified: Secondary | ICD-10-CM | POA: Diagnosis not present

## 2013-12-24 DIAGNOSIS — Z0000 Encounter for general adult medical examination without abnormal findings: Secondary | ICD-10-CM | POA: Diagnosis not present

## 2013-12-24 DIAGNOSIS — J449 Chronic obstructive pulmonary disease, unspecified: Secondary | ICD-10-CM | POA: Diagnosis not present

## 2013-12-25 ENCOUNTER — Ambulatory Visit: Payer: Medicare Other

## 2013-12-25 DIAGNOSIS — Z961 Presence of intraocular lens: Secondary | ICD-10-CM | POA: Diagnosis not present

## 2013-12-25 DIAGNOSIS — H40009 Preglaucoma, unspecified, unspecified eye: Secondary | ICD-10-CM | POA: Diagnosis not present

## 2013-12-25 DIAGNOSIS — H33009 Unspecified retinal detachment with retinal break, unspecified eye: Secondary | ICD-10-CM | POA: Diagnosis not present

## 2013-12-26 ENCOUNTER — Ambulatory Visit: Payer: Medicare Other | Admitting: Oncology

## 2013-12-26 ENCOUNTER — Other Ambulatory Visit: Payer: Medicare Other

## 2013-12-30 ENCOUNTER — Ambulatory Visit: Payer: Medicare Other

## 2014-01-02 ENCOUNTER — Other Ambulatory Visit (HOSPITAL_BASED_OUTPATIENT_CLINIC_OR_DEPARTMENT_OTHER): Payer: Medicare Other

## 2014-01-02 ENCOUNTER — Ambulatory Visit: Payer: Medicare Other

## 2014-01-02 ENCOUNTER — Telehealth: Payer: Self-pay | Admitting: Oncology

## 2014-01-02 ENCOUNTER — Ambulatory Visit (HOSPITAL_BASED_OUTPATIENT_CLINIC_OR_DEPARTMENT_OTHER): Payer: Medicare Other | Admitting: Oncology

## 2014-01-02 ENCOUNTER — Ambulatory Visit (HOSPITAL_BASED_OUTPATIENT_CLINIC_OR_DEPARTMENT_OTHER): Payer: Medicare Other

## 2014-01-02 VITALS — BP 142/66 | HR 76 | Temp 97.5°F | Resp 20 | Ht 63.0 in | Wt 126.8 lb

## 2014-01-02 DIAGNOSIS — C773 Secondary and unspecified malignant neoplasm of axilla and upper limb lymph nodes: Secondary | ICD-10-CM

## 2014-01-02 DIAGNOSIS — C50912 Malignant neoplasm of unspecified site of left female breast: Secondary | ICD-10-CM

## 2014-01-02 DIAGNOSIS — J439 Emphysema, unspecified: Secondary | ICD-10-CM

## 2014-01-02 DIAGNOSIS — C782 Secondary malignant neoplasm of pleura: Secondary | ICD-10-CM

## 2014-01-02 DIAGNOSIS — J910 Malignant pleural effusion: Secondary | ICD-10-CM | POA: Diagnosis not present

## 2014-01-02 DIAGNOSIS — I700 Atherosclerosis of aorta: Secondary | ICD-10-CM

## 2014-01-02 DIAGNOSIS — Z5111 Encounter for antineoplastic chemotherapy: Secondary | ICD-10-CM

## 2014-01-02 DIAGNOSIS — C50919 Malignant neoplasm of unspecified site of unspecified female breast: Secondary | ICD-10-CM

## 2014-01-02 DIAGNOSIS — Z853 Personal history of malignant neoplasm of breast: Secondary | ICD-10-CM

## 2014-01-02 DIAGNOSIS — C50911 Malignant neoplasm of unspecified site of right female breast: Secondary | ICD-10-CM

## 2014-01-02 LAB — COMPREHENSIVE METABOLIC PANEL (CC13)
ALT: 13 U/L (ref 0–55)
AST: 15 U/L (ref 5–34)
Albumin: 3.6 g/dL (ref 3.5–5.0)
Alkaline Phosphatase: 87 U/L (ref 40–150)
Anion Gap: 9 mEq/L (ref 3–11)
BUN: 11.3 mg/dL (ref 7.0–26.0)
CHLORIDE: 104 meq/L (ref 98–109)
CO2: 26 mEq/L (ref 22–29)
Calcium: 9.3 mg/dL (ref 8.4–10.4)
Creatinine: 0.7 mg/dL (ref 0.6–1.1)
GLUCOSE: 85 mg/dL (ref 70–140)
Potassium: 4.8 mEq/L (ref 3.5–5.1)
SODIUM: 139 meq/L (ref 136–145)
Total Bilirubin: 0.32 mg/dL (ref 0.20–1.20)
Total Protein: 6.3 g/dL — ABNORMAL LOW (ref 6.4–8.3)

## 2014-01-02 LAB — CBC WITH DIFFERENTIAL/PLATELET
BASO%: 0.3 % (ref 0.0–2.0)
Basophils Absolute: 0 10*3/uL (ref 0.0–0.1)
EOS ABS: 0.2 10*3/uL (ref 0.0–0.5)
EOS%: 3.1 % (ref 0.0–7.0)
HEMATOCRIT: 38.6 % (ref 34.8–46.6)
HGB: 13.2 g/dL (ref 11.6–15.9)
LYMPH%: 38 % (ref 14.0–49.7)
MCH: 29.5 pg (ref 25.1–34.0)
MCHC: 34.2 g/dL (ref 31.5–36.0)
MCV: 86.4 fL (ref 79.5–101.0)
MONO#: 0.5 10*3/uL (ref 0.1–0.9)
MONO%: 7.1 % (ref 0.0–14.0)
NEUT%: 51.5 % (ref 38.4–76.8)
NEUTROS ABS: 3.7 10*3/uL (ref 1.5–6.5)
PLATELETS: 204 10*3/uL (ref 145–400)
RBC: 4.47 10*6/uL (ref 3.70–5.45)
RDW: 14 % (ref 11.2–14.5)
WBC: 7.2 10*3/uL (ref 3.9–10.3)
lymph#: 2.7 10*3/uL (ref 0.9–3.3)

## 2014-01-02 MED ORDER — FULVESTRANT 250 MG/5ML IM SOLN
500.0000 mg | Freq: Once | INTRAMUSCULAR | Status: AC
  Administered 2014-01-02: 500 mg via INTRAMUSCULAR
  Filled 2014-01-02: qty 10

## 2014-01-02 NOTE — Telephone Encounter (Signed)
gv pt appt schedule for oct and referral for cxr 10/1 same day as lb/fu/inj.

## 2014-01-02 NOTE — Progress Notes (Signed)
[place]  [phone]) [phone] Fax:(336) [phone]     ID: Brianna Duke OB: 06-18-1927  MR#: [id number]  KZS#:[id number]  PCP: [name], MD GYN:   SU:  OTHER MD: [name], Jeneen Rinks crossley  CHIEF COMPLAINT: Metastatic breast cancer CURRENT TREATMENT: Fulvestrant  BREAST CANCER HISTORY: From [name] intake note dated 11/05/2007:  "She presented to Dr. [name] back in February for a complete physical exam.  She has a history of breast cancer treated back in 1992 by Dr. [name].  She had had a left lumpectomy by Dr. [name] for a reasonably large lesion, although I do not have any of the detail and she also had lymph nodes involved at that time, which I believe, 5 of 15 nodes were involved.  Pathology department no longer has this information.  She did have external beam radiation therapy for this.  We are awaiting that information as well.  For her complete physical, he recommended screening mammogram.  This was performed March 3 and it showed a possible mass noted in the right breast.  Spot compression views and possibly ultrasound were recommended for further evaluation.  Left breast was unremarkable.  She came back for that on March 27 and this showed that the question small mass in the right breast at the 12 o'clock position persisted measuring 4 cm from the nipple, a 0.76 x 0.9 cm hypoechoic mass correlating to the mammographic findings.  They recommended further workup.  On March 30, she had a core biopsy and this showed invasive ductal carcinoma ER/PR positive, HER-2/neu negative.  She went on to have an MRI of the bilateral breasts on April 9 and it showed a 1.1 x 0.8 x 0.5 known invasive ductal carcinoma at the 12 o'clock position of the right breast with an adjacent 3 mm possible satellite lesion immediately adjacent to the posteroinferior aspect of this mass.  There were multiple additional right breast nodules 4 mm or less in maximum  diameter most likely representing normal background nodular parenchyma.  None of these were large enough to warrant a biopsy at the time.  There was no background parenchymal nodularity or enhancement in the left breast possibly due to previous radiation.  There was asymmetrical and mildly prominent right axillary and retropectoral lymph nodes and a second look ultrasound of the right axilla was recommended.  She was given an ultrasound appointment at 11 a.m. on April 10; however, she called in and canceled that appointment saying she does not want to reschedule.  She told me that she had gone up to [place] and was considering not having any kind of treatment at all.  She had multiple small liver masses also noted on MRI, which were too small to accurately characterize on the current images, moderate-sized hiatal hernia.  She then finally returned to town and agreed to have surgery and chest x-ray on May 21, showed slight hyperinflation but no acute process seen.  She went on to have her lumpectomy and sentinel node biopsy by [name] on May 27.  This showed a 1.1 cm invasive ductal carcinoma grade 1 with low-grade DCIS with the DCIS margin being less than 0.1 cm in the lateral superficial resection margins.  Two sentinel nodes were negative.  [name] discussed with her the need for radiation and has sent her here for our opinion regarding the role of radiation.  He also discussed with her hormone receptor status being positive, but the patient declined taking any sort of  medication or even having a medical oncology referral."  The patient did complete radiation to the right breast 12/14/2007, with a boost to a total dose of 6280 cGy . She was subsequently lost to followup.  More recently the patient noted left sided supraclavicular adenopathy. She brought this to the attention of Dr. [name] who obtained a chest CT 07/16/2013 at [facility]. This showed left supraclavicular and anterior mediastinal  adenopathy, a left-sided pleural effusion on with evidence of left-sided pleural disease, a left adrenal mass and probable retroperitoneal adenopathy, several splenic lesions, some right retrocrural adenopathy, nonspecific liver lesions and also evidence of aortic atherosclerosis and emphysema. On 07/21/2013 the patient underwent left thoracentesis, in the pleural fluid showed ([id number]) malignant cells consistent with breast adenocarcinoma, estrogen receptor 35% positive, progesterone receptor 22% positive, with no HER-2 amplification, the signals ratio being 1.40 and the number per cell 1.75.  Her subsequent history is as detailed below.  INTERVAL HISTORY: Svara returns today for followup of her metastatic breast cancer. She was started on Faslodex 09/04/2013 and after the initial loading dose she started monthly treatments 10/02/2013. She underwent left thoracentesis 07/21/2013 and 09/05/2013, but not since. She is tolerating the Faslodex with some soreness at the site of infection for a day or 2 as her only side effect.  ROS: She tells me after her second thoracentesis "my color got better". She is also breathing considerably better. She still has a little bit of a cough, a little bit of hoarseness. Her energy is "good". She does go to the fitness center occasionally and does quite a bit of walking. She denies pleurisy, hemoptysis, or purulent sputum. A detailed review of systems today was remarkably benign  PAST MEDICAL HISTORY: Past Medical History  Diagnosis Date  . Hyperlipidemia   . Bronchitis     history  . Retinal detachment     history of  . COPD (chronic obstructive pulmonary disease)   . Meniere's disease     surgery  . History of radiation therapy 06/15/00- 08/22/00    left breast, 5940 cGy 33 fractions  . Hx of radiation therapy 11/18/07- 01/02/08    right breast 2880 cGy 16 fractions, right breast 2000 cGy 10 fractions, right breast boost 1400 cGy 7 fractions  . Anxiety   .  Varicose veins   . Hypertension   . Thyroid disease     hypothyroid  . Insomnia   . Chronic kidney disease     stage 2  . Breast cancer 05/31/00     left lower outer   . Malignant neoplasm of breast (female), unspecified site 10/02/07    right 12 o'clock , invasive ductal  . Skin cancer     squamous cell- face  . Baker's cyst of knee     right knee  . Metastases to the liver     breast primary  . 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 Metastasis to adrenal gland   . Pleural effusion, malignant 07/21/13    left lung, breast primary  . Cataract     surgery onboth  . Depression     PAST SURGICAL HISTORY: Past Surgical History  Procedure Laterality Date  . Appendectomy      80 yrs old  . Pituitary surgery  2005    adenoma removed  . Thyroidectomy, partial  1991  . Tonsillectomy      age 49    FAMILY HISTORY Family History  Problem Relation Age of Onset  . Cancer Mother   .  Cancer Sister     ovarian  . Cancer Sister 69    breast   the patient's father died at the age of 60 in a tractor accident. The patient is one of 9 siblings. Only at 2 siblings survive in addition to her. One is her brother Carmin Muskrat who lives in [place], and sister Alisia Ferrari who lives in [place].Marland KitchenHe can be reached at [phone].   GYNECOLOGIC HISTORY:  Menarche age 27. The patient is GX P0. 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 She moved to [place] from [place] around that time. She is single. She lives alone, with no pets. She attends the good [employer]. In addition to her siblings, listed above, she frequently visits her niece Meryl Dare in [place]. She can be reached at [phone], and she is also close to her friend Renne Musca who lives in [place].  ADVANCED DIRECTIVES: Not in place; on her  09/04/2013 visit the patient was given healthcare power of attorney and living will documents to complete and notarize at her discretion   HEALTH MAINTENANCE: History  Substance Use Topics  . Smoking status: Current Every Day Smoker -- 1.00 packs/day for 30 years    Types: Cigarettes  . Smokeless tobacco: Not on file  . Alcohol Use: Yes     Comment: rarely   No Known Allergies  Current Outpatient Prescriptions  Medication Sig Dispense Refill  . levothyroxine (SYNTHROID, LEVOTHROID) 75 MCG tablet Take 75 mcg by mouth daily before breakfast.      . LORazepam (ATIVAN) 1 MG tablet Take 1 tablet (1 mg total) by mouth 2 (two) times daily as needed for anxiety.  30 tablet  0  . Multiple Minerals (CALCIUM/MAGNESIUM/ZINC PO) Take 1 tablet by mouth every morning.      . naproxen sodium (ANAPROX) 220 MG tablet Take 220 mg by mouth 2 (two) times daily as needed.       . Omega-3 Fatty Acids (FISH OIL) 1000 MG CAPS Take 2 capsules by mouth daily.      . Vitamin D, Ergocalciferol, (DRISDOL) 50000 UNITS CAPS capsule Take 1,000 Units by mouth daily.       No current facility-administered medications for this visit.    OBJECTIVE: Older white woman in no acute distress Filed Vitals:   01/02/14 1115  BP: 142/66  Pulse: 76  Temp: 97.5 F (36.4 C)  Resp: 20     Body mass index is 22.47 kg/(m^2).      ECOG FS:1 - Symptomatic but completely ambulatory  Sclerae unicteric, pupils equal and reactive Oropharynx clear and moist No cervical or supraclavicular adenopathy Lungs no rales or rhonchi Heart regular rate and rhythm Abd soft, nontender, positive bowel sounds MSK no focal spinal tenderness, no upper extremity lymphedema Neuro: nonfocal, well oriented, appropriate affect Breasts: Both breasts are status post lumpectomy and radiation. There is no evidence of local recurrence. Both axillae are benign.    LAB RESULTS:  CMP     Component Value Date/Time   NA 139 01/02/2014 1101   NA 138  09/26/2007 1206   K 4.8 01/02/2014 1101   K 3.6 09/26/2007 1206   CL 104 09/26/2007 1206   CO2 26 01/02/2014 1101   CO2 24 09/26/2007 1206   GLUCOSE 85 01/02/2014 1101   GLUCOSE 118* 09/26/2007 1206  BUN 11.3 01/02/2014 1101   BUN 8 09/26/2007 1206   CREATININE 0.7 01/02/2014 1101   CREATININE 0.78 09/26/2007 1206   CALCIUM 9.3 01/02/2014 1101   CALCIUM 9.1 09/26/2007 1206   PROT 6.3* 01/02/2014 1101   ALBUMIN 3.6 01/02/2014 1101   AST 15 01/02/2014 1101   ALT 13 01/02/2014 1101   ALKPHOS 87 01/02/2014 1101   BILITOT 0.32 01/02/2014 1101   GFRNONAA >60 09/26/2007 1206   GFRAA  Value: >60        The eGFR has been calculated using the MDRD equation. This calculation has not been validated in all clinical 09/26/2007 1206   I No results found for this basename: SPEP,  UPEP,   kappa and lambda light chains    Lab Results  Component Value Date   WBC 7.2 01/02/2014   NEUTROABS 3.7 01/02/2014   HGB 13.2 01/02/2014   HCT 38.6 01/02/2014   MCV 86.4 01/02/2014   PLT 204 01/02/2014      Chemistry      Component Value Date/Time   NA 139 01/02/2014 1101   NA 138 09/26/2007 1206   K 4.8 01/02/2014 1101   K 3.6 09/26/2007 1206   CL 104 09/26/2007 1206   CO2 26 01/02/2014 1101   CO2 24 09/26/2007 1206   BUN 11.3 01/02/2014 1101   BUN 8 09/26/2007 1206   CREATININE 0.7 01/02/2014 1101   CREATININE 0.78 09/26/2007 1206      Component Value Date/Time   CALCIUM 9.3 01/02/2014 1101   CALCIUM 9.1 09/26/2007 1206   ALKPHOS 87 01/02/2014 1101   AST 15 01/02/2014 1101   ALT 13 01/02/2014 1101   BILITOT 0.32 01/02/2014 1101       No results found for this basename: LABCA2    No components found with this basename: LABCA125    No results found for this basename: INR,  in the last 168 hours  Urinalysis No results found for this basename: colorurine,  appearanceur,  labspec,  phurine,  glucoseu,  hgbur,  bilirubinur,  ketonesur,  proteinur,  urobilinogen,  nitrite,  leukocytesur    STUDIES: No results  found.    ASSESSMENT: 80 y.o. [place] woman with a history of bilateral breast cancer, now stage IV, as follows  (1) LEFT BREAST:  (a) status post left excisional biopsy 05/14/2000 for a pT2 NX, stage II invasive lobular breast cancer, estrogen receptor 83% positive, progesterone receptor 90% positive, HER-2 negative, with positive margins   (b) left breast reexcision for margin clearance, with left axillary lymph node dissection 05/31/2000, with negative margins but 5/15 axillary lymph nodes involved, final stage pT2 pN2, stage IIIA  (c) status post radiation to the left breast (not to axilla)  (d) on tamoxifen approximately 5 years per patient report  (2) RIGHT BREAST:  (a) status post right lumpectomy and sentinel lymph node sampling 05/272009 for a pT1c pN0, stage IA invasive ductal carcinoma, grade 1, estrogen receptor 99% positive, progesterone receptor and 98% positive, with an MIB-1 of 11%, and no HER-2 amplification.  (b) status post radiation to the right breast completed August 2009  (c) the patient refused antiestrogen therapy and medical oncology evaluation  (3) METASTATIC DISEASE:  (a) staging studies March and April 2015 show extensive metastatic adenopathy (left supraclavicular and left axillary,.  retrocrural, etc.), with splenic involvement, and with significant left pleural involvement and a large left pleural effusion; bone involvement was questionable  (b) left thoracentesis 07/21/2013 confirms a malignant effusion, with adenocarcinoma cells  positive for estrogen and progesterone receptor, negative for HER-2 amplification  (c) fulvestrant started 09/04/2013  PLAN: Kristin is tolerating the fulvestrant well. Of course she has some soreness for a couple of days after each shot. Her biggest problem however is financial. She tells me she is getting bills for several thousand dollars every month and that she is paying them. In general, our patients on Medicare are not build for  this drug, as far as I am aware, and it may be that there is an error in entering the charges. I asked our clinic Dir., Dollene Primrose to help [place] sort this out and he came over and met with the patient to Dir. further regarding cost issues.  Otherwise the plan is to continue the monthly fulvestrant so long as we do not have evidence of disease progression. Clinically she is clearly improved. We will obtain a repeat chest x-ray before her next visit here, which will be in October.  [facility] has a good understanding of this plan. She agrees with it. She knows of the goal of treatment in her case is control. She will call with any problems that may develop before the next visit here.   Chauncey Cruel, MD  01/02/2014 12:07 PM

## 2014-01-05 ENCOUNTER — Telehealth: Payer: Self-pay | Admitting: Oncology

## 2014-01-05 NOTE — Telephone Encounter (Signed)
per pof to call pt to remind of CXR on 10/1-cld & spoke to pt -pt had info to go to [facility] and havew CXR 10/1-pt understood

## 2014-01-13 DIAGNOSIS — S01119A Laceration without foreign body of unspecified eyelid and periocular area, initial encounter: Secondary | ICD-10-CM | POA: Diagnosis not present

## 2014-01-14 DIAGNOSIS — S01119A Laceration without foreign body of unspecified eyelid and periocular area, initial encounter: Secondary | ICD-10-CM | POA: Diagnosis not present

## 2014-01-14 DIAGNOSIS — H5702 Anisocoria: Secondary | ICD-10-CM | POA: Diagnosis not present

## 2014-01-14 DIAGNOSIS — H40019 Open angle with borderline findings, low risk, unspecified eye: Secondary | ICD-10-CM | POA: Diagnosis not present

## 2014-01-21 ENCOUNTER — Other Ambulatory Visit: Payer: Self-pay | Admitting: Emergency Medicine

## 2014-01-21 DIAGNOSIS — F419 Anxiety disorder, unspecified: Secondary | ICD-10-CM

## 2014-01-21 MED ORDER — LORAZEPAM 1 MG PO TABS: 1.0000 mg | ORAL_TABLET | Freq: Two times a day (BID) | ORAL | Status: DC | PRN

## 2014-02-04 ENCOUNTER — Other Ambulatory Visit: Payer: Self-pay

## 2014-02-05 ENCOUNTER — Ambulatory Visit (HOSPITAL_BASED_OUTPATIENT_CLINIC_OR_DEPARTMENT_OTHER): Payer: Medicare Other | Admitting: Oncology

## 2014-02-05 ENCOUNTER — Ambulatory Visit (HOSPITAL_COMMUNITY)
Admission: RE | Admit: 2014-02-05 | Discharge: 2014-02-05 | Disposition: A | Discharge status: Home / Self Care | Payer: Medicare Other | Source: Ambulatory Visit | Attending: Diagnostic Radiology | Admitting: Diagnostic Radiology

## 2014-02-05 ENCOUNTER — Other Ambulatory Visit (HOSPITAL_BASED_OUTPATIENT_CLINIC_OR_DEPARTMENT_OTHER): Payer: Medicare Other

## 2014-02-05 ENCOUNTER — Ambulatory Visit (HOSPITAL_BASED_OUTPATIENT_CLINIC_OR_DEPARTMENT_OTHER): Payer: Medicare Other

## 2014-02-05 ENCOUNTER — Telehealth: Payer: Self-pay | Admitting: Oncology

## 2014-02-05 VITALS — BP 104/43 | HR 74 | Temp 98.1°F | Resp 18 | Ht 63.0 in | Wt 123.1 lb

## 2014-02-05 DIAGNOSIS — C50912 Malignant neoplasm of unspecified site of left female breast: Secondary | ICD-10-CM | POA: Insufficient documentation

## 2014-02-05 DIAGNOSIS — C73 Malignant neoplasm of thyroid gland: Secondary | ICD-10-CM | POA: Insufficient documentation

## 2014-02-05 DIAGNOSIS — C778 Secondary and unspecified malignant neoplasm of lymph nodes of multiple regions: Secondary | ICD-10-CM | POA: Diagnosis not present

## 2014-02-05 DIAGNOSIS — R52 Pain, unspecified: Secondary | ICD-10-CM | POA: Diagnosis not present

## 2014-02-05 DIAGNOSIS — J910 Malignant pleural effusion: Secondary | ICD-10-CM | POA: Insufficient documentation

## 2014-02-05 DIAGNOSIS — C50911 Malignant neoplasm of unspecified site of right female breast: Secondary | ICD-10-CM | POA: Diagnosis not present

## 2014-02-05 DIAGNOSIS — C782 Secondary malignant neoplasm of pleura: Secondary | ICD-10-CM | POA: Diagnosis not present

## 2014-02-05 DIAGNOSIS — Z5111 Encounter for antineoplastic chemotherapy: Secondary | ICD-10-CM

## 2014-02-05 DIAGNOSIS — C7802 Secondary malignant neoplasm of left lung: Secondary | ICD-10-CM

## 2014-02-05 DIAGNOSIS — J181 Lobar pneumonia, unspecified organism: Secondary | ICD-10-CM | POA: Insufficient documentation

## 2014-02-05 DIAGNOSIS — Z853 Personal history of malignant neoplasm of breast: Secondary | ICD-10-CM | POA: Diagnosis not present

## 2014-02-05 DIAGNOSIS — C50919 Malignant neoplasm of unspecified site of unspecified female breast: Secondary | ICD-10-CM

## 2014-02-05 DIAGNOSIS — Z720 Tobacco use: Secondary | ICD-10-CM

## 2014-02-05 DIAGNOSIS — I700 Atherosclerosis of aorta: Secondary | ICD-10-CM

## 2014-02-05 DIAGNOSIS — Z85850 Personal history of malignant neoplasm of thyroid: Secondary | ICD-10-CM | POA: Diagnosis not present

## 2014-02-05 LAB — CBC WITH DIFFERENTIAL/PLATELET
BASO%: 1 % (ref 0.0–2.0)
BASOS ABS: 0.1 10*3/uL (ref 0.0–0.1)
EOS%: 2.2 % (ref 0.0–7.0)
Eosinophils Absolute: 0.2 10*3/uL (ref 0.0–0.5)
HCT: 41.5 % (ref 34.8–46.6)
HEMOGLOBIN: 13.8 g/dL (ref 11.6–15.9)
LYMPH#: 2.4 10*3/uL (ref 0.9–3.3)
LYMPH%: 32.1 % (ref 14.0–49.7)
MCH: 29.5 pg (ref 25.1–34.0)
MCHC: 33.3 g/dL (ref 31.5–36.0)
MCV: 88.5 fL (ref 79.5–101.0)
MONO#: 0.6 10*3/uL (ref 0.1–0.9)
MONO%: 7.8 % (ref 0.0–14.0)
NEUT#: 4.2 10*3/uL (ref 1.5–6.5)
NEUT%: 56.9 % (ref 38.4–76.8)
Platelets: 229 10*3/uL (ref 145–400)
RBC: 4.68 10*6/uL (ref 3.70–5.45)
RDW: 14.5 % (ref 11.2–14.5)
WBC: 7.4 10*3/uL (ref 3.9–10.3)

## 2014-02-05 LAB — COMPREHENSIVE METABOLIC PANEL (CC13)
ALBUMIN: 3.6 g/dL (ref 3.5–5.0)
ALT: 9 U/L (ref 0–55)
AST: 14 U/L (ref 5–34)
Alkaline Phosphatase: 74 U/L (ref 40–150)
Anion Gap: 6 mEq/L (ref 3–11)
BUN: 15.4 mg/dL (ref 7.0–26.0)
CALCIUM: 9.7 mg/dL (ref 8.4–10.4)
CHLORIDE: 105 meq/L (ref 98–109)
CO2: 28 meq/L (ref 22–29)
Creatinine: 0.9 mg/dL (ref 0.6–1.1)
GLUCOSE: 89 mg/dL (ref 70–140)
POTASSIUM: 4.9 meq/L (ref 3.5–5.1)
Sodium: 140 mEq/L (ref 136–145)
TOTAL PROTEIN: 6.4 g/dL (ref 6.4–8.3)
Total Bilirubin: 0.47 mg/dL (ref 0.20–1.20)

## 2014-02-05 MED ORDER — FULVESTRANT 250 MG/5ML IM SOLN
500.0000 mg | Freq: Once | INTRAMUSCULAR | Status: AC
  Administered 2014-02-05: 500 mg via INTRAMUSCULAR
  Filled 2014-02-05: qty 10

## 2014-02-05 NOTE — Telephone Encounter (Signed)
per pof to sch pt appt-gave pt copy of sch-adv pt of CXR to to done 05/27/14

## 2014-02-05 NOTE — Progress Notes (Signed)
[place]  [phone] Fax:([phone]     ID: [name] OB: Sep 27, 1927  MR#: [id number]  DUK#:[id number]  PCP: [name], MD GYN:   SU:  OTHER MD: [name], james crossley  CHIEF COMPLAINT: Metastatic breast cancer CURRENT TREATMENT: Fulvestrant  BREAST CANCER HISTORY: From [name] intake note dated 11/05/2007:  "She presented to Dr. [name] back in February for a complete physical exam.  She has a history of breast cancer treated back in 1992 by Dr. [name].  She had had a left lumpectomy by Dr. [name] for a reasonably large lesion, although I do not have any of the detail and she also had lymph nodes involved at that time, which I believe, 5 of 15 nodes were involved.  Pathology department no longer has this information.  She did have external beam radiation therapy for this.  We are awaiting that information as well.  For her complete physical, he recommended screening mammogram.  This was performed March 3 and it showed a possible mass noted in the right breast.  Spot compression views and possibly ultrasound were recommended for further evaluation.  Left breast was unremarkable.  She came back for that on March 27 and this showed that the question small mass in the right breast at the 12 o'clock position persisted measuring 4 cm from the nipple, a 0.76 x 0.9 cm hypoechoic mass correlating to the mammographic findings.  They recommended further workup.  On March 30, she had a core biopsy and this showed invasive ductal carcinoma ER/PR positive, HER-2/neu negative.  She went on to have an MRI of the bilateral breasts on April 9 and it showed a 1.1 x 0.8 x 0.5 known invasive ductal carcinoma at the 12 o'clock position of the right breast with an adjacent 3 mm possible satellite lesion immediately adjacent to the posteroinferior aspect of this mass.  There were multiple additional right breast nodules 4 mm or less in maximum  diameter most likely representing normal background nodular parenchyma.  None of these were large enough to warrant a biopsy at the time.  There was no background parenchymal nodularity or enhancement in the left breast possibly due to previous radiation.  There was asymmetrical and mildly prominent right axillary and retropectoral lymph nodes and a second look ultrasound of the right axilla was recommended.  She was given an ultrasound appointment at 11 a.m. on April 10; however, she called in and canceled that appointment saying she does not want to reschedule.  She told me that she had gone up to [place] and was considering not having any kind of treatment at all.  She had multiple small liver masses also noted on MRI, which were too small to accurately characterize on the current images, moderate-sized hiatal hernia.  She then finally returned to town and agreed to have surgery and chest x-ray on May 21, showed slight hyperinflation but no acute process seen.  She went on to have her lumpectomy and sentinel node biopsy by [name] on May 27.  This showed a 1.1 cm invasive ductal carcinoma grade 1 with low-grade DCIS with the DCIS margin being less than 0.1 cm in the lateral superficial resection margins.  Two sentinel nodes were negative.  [name] discussed with her the need for radiation and has sent her here for our opinion regarding the role of radiation.  He also discussed with her hormone receptor status being positive, but the patient declined taking any sort of  medication or even having a medical oncology referral."  The patient did complete radiation to the right breast 12/14/2007, with a boost to a total dose of 6280 cGy . She was subsequently lost to followup.  More recently the patient noted left sided supraclavicular adenopathy. She brought this to the attention of Dr. [name] who obtained a chest CT 07/16/2013 at [facility]. This showed left supraclavicular and anterior mediastinal  adenopathy, a left-sided pleural effusion on with evidence of left-sided pleural disease, a left adrenal mass and probable retroperitoneal adenopathy, several splenic lesions, some right retrocrural adenopathy, nonspecific liver lesions and also evidence of aortic atherosclerosis and emphysema. On 07/21/2013 the patient underwent left thoracentesis, in the pleural fluid showed ([id number]) malignant cells consistent with breast adenocarcinoma, estrogen receptor 35% positive, progesterone receptor 22% positive, with no HER-2 amplification, the signals ratio being 1.40 and the number per cell 1.75.  Her subsequent history is as detailed below.  INTERVAL HISTORY: Brianna Duke returns today for followup of her metastatic breast cancer. She continues on monthly fulvestrant. She is tolerating this well. She is traveling to [place] and back to visit family and friends and is thinking of selling her [place] and settling in [place].  ROS: She tells me they have found she has increased intraocular pressure in the right eye. She says her eye doctor wondered if this could be due to the fulvestrant and I am not aware of any data suggesting that. Fulvestrant works by decreasing the estrogen receptor expression and breast cancer cells. I am not aware of any eye issues (as opposed to for example tamoxifen) with this drug and a search in Pub Med crossing glaucoma and fulvestrant retrieved no articles.. Unfortunately she does not recall her ophthalmologists name so I cannot copy him to discuss this further. Aside from that Lead has absolutely no pulmonary symptoms including no shortness of breath, cough, pleurisy, or sense that she cannot take a deep breath. A detailed review of systems today was entirely noncontributory except as noted  PAST MEDICAL HISTORY: Past Medical History  Diagnosis Date  . Hyperlipidemia   . Bronchitis     history  . Retinal detachment     history of  . COPD (chronic obstructive  pulmonary disease)   . Meniere's disease     surgery  . History of radiation therapy 06/15/00- 08/22/00    left breast, 5940 cGy 33 fractions  . Hx of radiation therapy 11/18/07- 01/02/08    right breast 2880 cGy 16 fractions, right breast 2000 cGy 10 fractions, right breast boost 1400 cGy 7 fractions  . Anxiety   . Varicose veins   . Hypertension   . Thyroid disease     hypothyroid  . Insomnia   . Chronic kidney disease     stage 2  . Breast cancer 05/31/00     left lower outer   . Malignant neoplasm of breast (female), unspecified site 10/02/07    right 12 o'clock , invasive ductal  . Skin cancer     squamous cell- face  . Baker's cyst of knee     right knee  . Metastases to the liver     breast primary  . 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 Metastasis to adrenal gland   . Pleural effusion, malignant 07/21/13    left lung, breast primary  . Cataract     surgery onboth  . Depression     PAST SURGICAL HISTORY: Past Surgical History  Procedure Laterality Date  .  Appendectomy      80 yrs old  . Pituitary surgery  2005    adenoma removed  . Thyroidectomy, partial  1991  . Tonsillectomy      age 8    FAMILY HISTORY Family History  Problem Relation Age of Onset  . Cancer Mother   . Cancer Sister     ovarian  . Cancer Sister 32    breast   the patient's father died at the age of 25 in a tractor accident. The patient is one of 9 siblings. Only at 2 siblings survive in addition to her. One is her brother Carmin Muskrat who lives in [place], and sister Alisia Ferrari who lives in [place].Marland KitchenHe can be reached at [phone].   GYNECOLOGIC HISTORY:  Menarche age 89. The patient is GX P0. 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 She moved to [place] from [place] around that time. She is single.  She lives alone, with no pets. She attends the good [employer]. In addition to her siblings, listed above, she frequently visits her niece Meryl Dare in [place]. She can be reached at [phone], and she is also close to her friend Renne Musca who lives in [place].  ADVANCED DIRECTIVES: Not in place; on her 09/04/2013 visit the patient was given healthcare power of attorney and living will documents to complete and notarize at her discretion; "the family should take care of all that" if something happens to her   HEALTH MAINTENANCE: History  Substance Use Topics  . Smoking status: Current Every Day Smoker -- 1.00 packs/day for 30 years    Types: Cigarettes  . Smokeless tobacco: Not on file  . Alcohol Use: Yes     Comment: rarely   No Known Allergies  Current Outpatient Prescriptions  Medication Sig Dispense Refill  . levothyroxine (SYNTHROID, LEVOTHROID) 75 MCG tablet Take 75 mcg by mouth daily before breakfast.      . LORazepam (ATIVAN) 1 MG tablet Take 1 tablet (1 mg total) by mouth 2 (two) times daily as needed for anxiety.  30 tablet  1  . Multiple Minerals (CALCIUM/MAGNESIUM/ZINC PO) Take 1 tablet by mouth every morning.      . naproxen sodium (ANAPROX) 220 MG tablet Take 220 mg by mouth 2 (two) times daily as needed.       . Omega-3 Fatty Acids (FISH OIL) 1000 MG CAPS Take 2 capsules by mouth daily.      . Vitamin D, Ergocalciferol, (DRISDOL) 50000 UNITS CAPS capsule Take 1,000 Units by mouth daily.       No current facility-administered medications for this visit.    OBJECTIVE: Older white woman who appears well Filed Vitals:   02/05/14 1303  BP: 104/43  Pulse: 74  Temp: 98.1 F (36.7 C)  Resp: 18     Body mass index is 21.81 kg/(m^2).      ECOG FS:0 - Asymptomatic  Sclerae unicteric, pupils round and equal Oropharynx clear and moist No cervical or supraclavicular adenopathy Lungs no rales or rhonchi Heart regular rate and rhythm Abd soft, nontender,  positive bowel sounds MSK no focal spinal tenderness, no upper extremity lymphedema Neuro: nonfocal, well oriented, positive affect Breasts: The right breast is unremarkable, status post lumpectomy and radiation. The lower right axilla is benign. The left breast is a little bit  more lumpy and the silhouette is more distorted, with the nipple pointing downwards. This is stable. The left axilla is benign.    LAB RESULTS:  CMP     Component Value Date/Time   NA 140 02/05/2014 1204   NA 138 09/26/2007 1206   K 4.9 02/05/2014 1204   K 3.6 09/26/2007 1206   CL 104 09/26/2007 1206   CO2 28 02/05/2014 1204   CO2 24 09/26/2007 1206   GLUCOSE 89 02/05/2014 1204   GLUCOSE 118* 09/26/2007 1206   BUN 15.4 02/05/2014 1204   BUN 8 09/26/2007 1206   CREATININE 0.9 02/05/2014 1204   CREATININE 0.78 09/26/2007 1206   CALCIUM 9.7 02/05/2014 1204   CALCIUM 9.1 09/26/2007 1206   PROT 6.4 02/05/2014 1204   ALBUMIN 3.6 02/05/2014 1204   AST 14 02/05/2014 1204   ALT 9 02/05/2014 1204   ALKPHOS 74 02/05/2014 1204   BILITOT 0.47 02/05/2014 1204   GFRNONAA >60 09/26/2007 1206   GFRAA  Value: >60        The eGFR has been calculated using the MDRD equation. This calculation has not been validated in all clinical 09/26/2007 1206   I No results found for this basename: SPEP,  UPEP,   kappa and lambda light chains    Lab Results  Component Value Date   WBC 7.4 02/05/2014   NEUTROABS 4.2 02/05/2014   HGB 13.8 02/05/2014   HCT 41.5 02/05/2014   MCV 88.5 02/05/2014   PLT 229 02/05/2014      Chemistry      Component Value Date/Time   NA 140 02/05/2014 1204   NA 138 09/26/2007 1206   K 4.9 02/05/2014 1204   K 3.6 09/26/2007 1206   CL 104 09/26/2007 1206   CO2 28 02/05/2014 1204   CO2 24 09/26/2007 1206   BUN 15.4 02/05/2014 1204   BUN 8 09/26/2007 1206   CREATININE 0.9 02/05/2014 1204   CREATININE 0.78 09/26/2007 1206      Component Value Date/Time   CALCIUM 9.7 02/05/2014 1204   CALCIUM 9.1 09/26/2007 1206   ALKPHOS 74  02/05/2014 1204   AST 14 02/05/2014 1204   ALT 9 02/05/2014 1204   BILITOT 0.47 02/05/2014 1204       No results found for this basename: LABCA2    No components found with this basename: LABCA125    No results found for this basename: INR,  in the last 168 hours  Urinalysis No results found for this basename: colorurine,  appearanceur,  labspec,  phurine,  glucoseu,  hgbur,  bilirubinur,  ketonesur,  proteinur,  urobilinogen,  nitrite,  leukocytesur    STUDIES: No results found.    ASSESSMENT: 80 y.o. [place] woman with a history of bilateral breast cancer, now stage IV, as follows  (1) LEFT BREAST:  (a) status post left excisional biopsy 05/14/2000 for a pT2 NX, stage II invasive lobular breast cancer, estrogen receptor 83% positive, progesterone receptor 90% positive, HER-2 negative, with positive margins   (b) left breast reexcision for margin clearance, with left axillary lymph node dissection 05/31/2000, with negative margins but 5/15 axillary lymph nodes involved, final stage pT2 pN2, stage IIIA  (c) status post radiation to the left breast (not to axilla)  (d) on tamoxifen approximately 5 years per patient report  (2) RIGHT BREAST:  (a) status post right lumpectomy and sentinel lymph node sampling 05/272009 for a pT1c pN0, stage IA invasive ductal carcinoma, grade 1, estrogen receptor 99% positive, progesterone  receptor and 98% positive, with an MIB-1 of 11%, and no HER-2 amplification.  (b) status post radiation to the right breast completed August 2009  (c) the patient refused antiestrogen therapy and medical oncology evaluation  (3) METASTATIC DISEASE:  (a) staging studies March and April 2015 show extensive metastatic adenopathy (left supraclavicular and left axillary,. retrocrural, etc.), with splenic involvement, and with significant left pleural involvement and a large left pleural effusion; bone involvement was questionable  (b) left thoracentesis 07/21/2013  confirms a malignant effusion, with adenocarcinoma cells positive for estrogen and progesterone receptor, negative for HER-2 amplification  (c) fulvestrant started 09/04/2013  PLAN: Agustina is entirely asymptomatic from her metastatic breast cancer. She is tolerating the fulvestrant well aside from the discomfort of receiving the shots. We did review the fact that fulvestrant does not cause glaucoma. The fact that she has some increase in her right intraocular pressure now that she is on fulvestrant is an association but does not prove causation.   In particular she denies any shortness of breath or other pulmonary symptoms. The chest x-ray shows the effusion to be minimally increased as compared to shortly after her thoracentesis a few months ago.  Accordingly we are continue on fulvestrant every 28 days. Because she gets treated on Thursday, this is going to present problems on Thanksgiving and December. She will have it on the day before each of those holidays. She will then return to see me on January 21. She will have a chest x-ray before that visit.  Trude knows the goal of treatment in her case is control. She agrees with the overall plan. She will call for any problems that may develop before the next visit here.  Chauncey Cruel, MD  02/05/2014 1:11 PM

## 2014-02-10 ENCOUNTER — Encounter: Payer: Self-pay | Admitting: Oncology

## 2014-02-10 ENCOUNTER — Telehealth: Payer: Self-pay | Admitting: *Deleted

## 2014-02-10 NOTE — Progress Notes (Signed)
Patient is aware of phone bill not being recd until 1st week of nov at the latest. I told her to let creditor know it will be late and she said she did.

## 2014-02-10 NOTE — Telephone Encounter (Signed)
This RN received call from [place] in radiology stating pt is present inquiring about obtaining a thoracentesis -  This RN spoke with pt and reviewed last xray obtained on Friday ( 4 days ago ) and current symptoms ( pt was seen by MD as well with no complaints ).  Per discussion pt is concerned " because I gurgle sometimes ". Unnamed denies overall SOB-  " if I exercise will that help get rid of the fluid, like walk real fast ?"  This RN explained to pt she has a small amount of fluid in her lungs and obtaining a thoracentesis may not be the best at this time- ( risk outweigh benefit ) Discussed with Cyril need to " exercise the lungs- with deep breathing exercises- this RN suggested pt obtain a bottle of " bubbles " to blow for therapy.  This RN explained pt is to maintain current activity level but increasing level of exercise may cause increased SOB- best to exercise " just the lungs " by above discussion.  Vasti verbalized understanding and per request by this RN she is to call the office this Thursday to give update.  No other needs at this time.  This note will be given to MD for review and appropriate follow up.

## 2014-03-05 ENCOUNTER — Ambulatory Visit (HOSPITAL_BASED_OUTPATIENT_CLINIC_OR_DEPARTMENT_OTHER): Payer: Medicare Other

## 2014-03-05 ENCOUNTER — Other Ambulatory Visit (HOSPITAL_BASED_OUTPATIENT_CLINIC_OR_DEPARTMENT_OTHER): Payer: Medicare Other

## 2014-03-05 VITALS — BP 108/76 | HR 70 | Temp 97.6°F

## 2014-03-05 DIAGNOSIS — C782 Secondary malignant neoplasm of pleura: Secondary | ICD-10-CM

## 2014-03-05 DIAGNOSIS — C50912 Malignant neoplasm of unspecified site of left female breast: Secondary | ICD-10-CM

## 2014-03-05 DIAGNOSIS — C773 Secondary and unspecified malignant neoplasm of axilla and upper limb lymph nodes: Secondary | ICD-10-CM

## 2014-03-05 DIAGNOSIS — J910 Malignant pleural effusion: Secondary | ICD-10-CM

## 2014-03-05 DIAGNOSIS — C7889 Secondary malignant neoplasm of other digestive organs: Secondary | ICD-10-CM | POA: Diagnosis not present

## 2014-03-05 DIAGNOSIS — Z5111 Encounter for antineoplastic chemotherapy: Secondary | ICD-10-CM

## 2014-03-05 DIAGNOSIS — C50812 Malignant neoplasm of overlapping sites of left female breast: Secondary | ICD-10-CM

## 2014-03-05 DIAGNOSIS — C50919 Malignant neoplasm of unspecified site of unspecified female breast: Secondary | ICD-10-CM

## 2014-03-05 DIAGNOSIS — C7802 Secondary malignant neoplasm of left lung: Secondary | ICD-10-CM

## 2014-03-05 LAB — CBC WITH DIFFERENTIAL/PLATELET
BASO%: 0.7 % (ref 0.0–2.0)
Basophils Absolute: 0 10*3/uL (ref 0.0–0.1)
EOS ABS: 0.2 10*3/uL (ref 0.0–0.5)
EOS%: 2.4 % (ref 0.0–7.0)
HCT: 40.2 % (ref 34.8–46.6)
HGB: 13.4 g/dL (ref 11.6–15.9)
LYMPH%: 32.4 % (ref 14.0–49.7)
MCH: 29.8 pg (ref 25.1–34.0)
MCHC: 33.3 g/dL (ref 31.5–36.0)
MCV: 89.6 fL (ref 79.5–101.0)
MONO#: 0.5 10*3/uL (ref 0.1–0.9)
MONO%: 8.2 % (ref 0.0–14.0)
NEUT#: 3.8 10*3/uL (ref 1.5–6.5)
NEUT%: 56.3 % (ref 38.4–76.8)
PLATELETS: 209 10*3/uL (ref 145–400)
RBC: 4.49 10*6/uL (ref 3.70–5.45)
RDW: 14.4 % (ref 11.2–14.5)
WBC: 6.7 10*3/uL (ref 3.9–10.3)
lymph#: 2.2 10*3/uL (ref 0.9–3.3)

## 2014-03-05 LAB — COMPREHENSIVE METABOLIC PANEL (CC13)
ALBUMIN: 3.6 g/dL (ref 3.5–5.0)
ALT: 11 U/L (ref 0–55)
AST: 14 U/L (ref 5–34)
Alkaline Phosphatase: 75 U/L (ref 40–150)
Anion Gap: 8 mEq/L (ref 3–11)
BILIRUBIN TOTAL: 0.28 mg/dL (ref 0.20–1.20)
BUN: 14.4 mg/dL (ref 7.0–26.0)
CO2: 28 mEq/L (ref 22–29)
Calcium: 9.3 mg/dL (ref 8.4–10.4)
Chloride: 103 mEq/L (ref 98–109)
Creatinine: 0.8 mg/dL (ref 0.6–1.1)
GLUCOSE: 117 mg/dL (ref 70–140)
Potassium: 4.2 mEq/L (ref 3.5–5.1)
SODIUM: 139 meq/L (ref 136–145)
TOTAL PROTEIN: 6.1 g/dL — AB (ref 6.4–8.3)

## 2014-03-05 MED ORDER — FULVESTRANT 250 MG/5ML IM SOLN
500.0000 mg | Freq: Once | INTRAMUSCULAR | Status: AC
  Administered 2014-03-05: 500 mg via INTRAMUSCULAR
  Filled 2014-03-05: qty 10

## 2014-03-05 NOTE — Patient Instructions (Signed)
Fulvestrant injection  What is this medicine?  FULVESTRANT (ful VES trant) blocks the effects of estrogen. It is used to treat breast cancer in women past the age of menopause.  This medicine may be used for other purposes; ask your health care provider or pharmacist if you have questions.  COMMON BRAND NAME(S): FASLODEX  What should I tell my health care provider before I take this medicine?  They need to know if you have any of these conditions:  -bleeding problems  -liver disease  -low levels of platelets in the blood  -an unusual or allergic reaction to fulvestrant, other medicines, foods, dyes, or preservatives  -pregnant or trying to get pregnant  -breast-feeding  How should I use this medicine?  This medicine is for injection into a muscle. It is usually given by a health care professional in a hospital or clinic setting.  Talk to your pediatrician regarding the use of this medicine in children. Special care may be needed.  Overdosage: If you think you have taken too much of this medicine contact a poison control center or emergency room at once.  NOTE: This medicine is only for you. Do not share this medicine with others.  What if I miss a dose?  It is important not to miss your dose. Call your doctor or health care professional if you are unable to keep an appointment.  What may interact with this medicine?  -medicines that treat or prevent blood clots like warfarin, enoxaparin, and dalteparin  This list may not describe all possible interactions. Give your health care provider a list of all the medicines, herbs, non-prescription drugs, or dietary supplements you use. Also tell them if you smoke, drink alcohol, or use illegal drugs. Some items may interact with your medicine.  What should I watch for while using this medicine?  Your condition will be monitored carefully while you are receiving this medicine. You will need important blood work done while you are taking this medicine.  Do not become pregnant  while taking this medicine. Women should inform their doctor if they wish to become pregnant or think they might be pregnant. There is a potential for serious side effects to an unborn child. Talk to your health care professional or pharmacist for more information.  What side effects may I notice from receiving this medicine?  Side effects that you should report to your doctor or health care professional as soon as possible:  -allergic reactions like skin rash, itching or hives, swelling of the face, lips, or tongue  -feeling faint or lightheaded, falls  -fever or flu-like symptoms  -sore throat  -vaginal bleeding  Side effects that usually do not require medical attention (report to your doctor or health care professional if they continue or are bothersome):  -aches, pains  -constipation or diarrhea  -headache  -hot flashes  -nausea, vomiting  -pain at site where injected  -stomach pain  This list may not describe all possible side effects. Call your doctor for medical advice about side effects. You may report side effects to FDA at 1-800-FDA-1088.  Where should I keep my medicine?  This drug is given in a hospital or clinic and will not be stored at home.  NOTE: This sheet is a summary. It may not cover all possible information. If you have questions about this medicine, talk to your doctor, pharmacist, or health care provider.   2015, Elsevier/Gold Standard. (2007-09-02 15:39:24)

## 2014-03-10 ENCOUNTER — Other Ambulatory Visit: Payer: Self-pay | Admitting: Oncology

## 2014-03-10 DIAGNOSIS — C50912 Malignant neoplasm of unspecified site of left female breast: Secondary | ICD-10-CM

## 2014-04-01 ENCOUNTER — Ambulatory Visit (HOSPITAL_BASED_OUTPATIENT_CLINIC_OR_DEPARTMENT_OTHER): Payer: Medicare Other

## 2014-04-01 ENCOUNTER — Other Ambulatory Visit: Payer: Self-pay | Admitting: *Deleted

## 2014-04-01 ENCOUNTER — Other Ambulatory Visit (HOSPITAL_BASED_OUTPATIENT_CLINIC_OR_DEPARTMENT_OTHER): Payer: Medicare Other

## 2014-04-01 DIAGNOSIS — C7802 Secondary malignant neoplasm of left lung: Secondary | ICD-10-CM

## 2014-04-01 DIAGNOSIS — J910 Malignant pleural effusion: Secondary | ICD-10-CM

## 2014-04-01 DIAGNOSIS — C50912 Malignant neoplasm of unspecified site of left female breast: Secondary | ICD-10-CM

## 2014-04-01 DIAGNOSIS — C50812 Malignant neoplasm of overlapping sites of left female breast: Secondary | ICD-10-CM

## 2014-04-01 DIAGNOSIS — Z5111 Encounter for antineoplastic chemotherapy: Secondary | ICD-10-CM

## 2014-04-01 DIAGNOSIS — C50919 Malignant neoplasm of unspecified site of unspecified female breast: Secondary | ICD-10-CM

## 2014-04-01 LAB — COMPREHENSIVE METABOLIC PANEL (CC13)
ALK PHOS: 76 U/L (ref 40–150)
ALT: 9 U/L (ref 0–55)
AST: 13 U/L (ref 5–34)
Albumin: 3.7 g/dL (ref 3.5–5.0)
Anion Gap: 8 mEq/L (ref 3–11)
BILIRUBIN TOTAL: 0.43 mg/dL (ref 0.20–1.20)
BUN: 15.8 mg/dL (ref 7.0–26.0)
CO2: 27 mEq/L (ref 22–29)
Calcium: 9 mg/dL (ref 8.4–10.4)
Chloride: 101 mEq/L (ref 98–109)
Creatinine: 0.8 mg/dL (ref 0.6–1.1)
GLUCOSE: 65 mg/dL — AB (ref 70–140)
Potassium: 4.4 mEq/L (ref 3.5–5.1)
Sodium: 136 mEq/L (ref 136–145)
Total Protein: 6 g/dL — ABNORMAL LOW (ref 6.4–8.3)

## 2014-04-01 LAB — CBC WITH DIFFERENTIAL/PLATELET
BASO%: 1 % (ref 0.0–2.0)
Basophils Absolute: 0.1 10*3/uL (ref 0.0–0.1)
EOS%: 1.5 % (ref 0.0–7.0)
Eosinophils Absolute: 0.1 10*3/uL (ref 0.0–0.5)
HEMATOCRIT: 38.3 % (ref 34.8–46.6)
HGB: 12.7 g/dL (ref 11.6–15.9)
LYMPH%: 30.9 % (ref 14.0–49.7)
MCH: 29.6 pg (ref 25.1–34.0)
MCHC: 33.2 g/dL (ref 31.5–36.0)
MCV: 89.3 fL (ref 79.5–101.0)
MONO#: 0.6 10*3/uL (ref 0.1–0.9)
MONO%: 7.5 % (ref 0.0–14.0)
NEUT%: 59.1 % (ref 38.4–76.8)
NEUTROS ABS: 4.4 10*3/uL (ref 1.5–6.5)
PLATELETS: 200 10*3/uL (ref 145–400)
RBC: 4.29 10*6/uL (ref 3.70–5.45)
RDW: 13.9 % (ref 11.2–14.5)
WBC: 7.4 10*3/uL (ref 3.9–10.3)
lymph#: 2.3 10*3/uL (ref 0.9–3.3)

## 2014-04-01 MED ORDER — FULVESTRANT 250 MG/5ML IM SOLN
500.0000 mg | Freq: Once | INTRAMUSCULAR | Status: DC
  Administered 2014-04-01: 500 mg via INTRAMUSCULAR
  Filled 2014-04-01: qty 10

## 2014-04-01 NOTE — Patient Instructions (Signed)
Fulvestrant injection  What is this medicine?  FULVESTRANT (ful VES trant) blocks the effects of estrogen. It is used to treat breast cancer in women past the age of menopause.  This medicine may be used for other purposes; ask your health care provider or pharmacist if you have questions.  COMMON BRAND NAME(S): FASLODEX  What should I tell my health care provider before I take this medicine?  They need to know if you have any of these conditions:  -bleeding problems  -liver disease  -low levels of platelets in the blood  -an unusual or allergic reaction to fulvestrant, other medicines, foods, dyes, or preservatives  -pregnant or trying to get pregnant  -breast-feeding  How should I use this medicine?  This medicine is for injection into a muscle. It is usually given by a health care professional in a hospital or clinic setting.  Talk to your pediatrician regarding the use of this medicine in children. Special care may be needed.  Overdosage: If you think you have taken too much of this medicine contact a poison control center or emergency room at once.  NOTE: This medicine is only for you. Do not share this medicine with others.  What if I miss a dose?  It is important not to miss your dose. Call your doctor or health care professional if you are unable to keep an appointment.  What may interact with this medicine?  -medicines that treat or prevent blood clots like warfarin, enoxaparin, and dalteparin  This list may not describe all possible interactions. Give your health care provider a list of all the medicines, herbs, non-prescription drugs, or dietary supplements you use. Also tell them if you smoke, drink alcohol, or use illegal drugs. Some items may interact with your medicine.  What should I watch for while using this medicine?  Your condition will be monitored carefully while you are receiving this medicine. You will need important blood work done while you are taking this medicine.  Do not become pregnant  while taking this medicine. Women should inform their doctor if they wish to become pregnant or think they might be pregnant. There is a potential for serious side effects to an unborn child. Talk to your health care professional or pharmacist for more information.  What side effects may I notice from receiving this medicine?  Side effects that you should report to your doctor or health care professional as soon as possible:  -allergic reactions like skin rash, itching or hives, swelling of the face, lips, or tongue  -feeling faint or lightheaded, falls  -fever or flu-like symptoms  -sore throat  -vaginal bleeding  Side effects that usually do not require medical attention (report to your doctor or health care professional if they continue or are bothersome):  -aches, pains  -constipation or diarrhea  -headache  -hot flashes  -nausea, vomiting  -pain at site where injected  -stomach pain  This list may not describe all possible side effects. Call your doctor for medical advice about side effects. You may report side effects to FDA at 1-800-FDA-1088.  Where should I keep my medicine?  This drug is given in a hospital or clinic and will not be stored at home.  NOTE: This sheet is a summary. It may not cover all possible information. If you have questions about this medicine, talk to your doctor, pharmacist, or health care provider.   2015, Elsevier/Gold Standard. (2007-09-02 15:39:24)

## 2014-04-21 ENCOUNTER — Telehealth: Payer: Self-pay | Admitting: Oncology

## 2014-04-21 NOTE — Telephone Encounter (Signed)
Lvm advising appt chg from 1/21 (md pal) to 2/24 @ 10am. Also mailed revised appt calendar.

## 2014-04-23 ENCOUNTER — Other Ambulatory Visit: Payer: Self-pay | Admitting: Oncology

## 2014-04-23 DIAGNOSIS — J410 Simple chronic bronchitis: Secondary | ICD-10-CM | POA: Diagnosis not present

## 2014-04-24 ENCOUNTER — Telehealth: Payer: Self-pay | Admitting: Oncology

## 2014-04-24 NOTE — Telephone Encounter (Signed)
pt cld wanting to r/s appt for lab & inj-r/s to 12/21 per Val date of-pt has date & time

## 2014-04-26 ENCOUNTER — Other Ambulatory Visit: Payer: Self-pay | Admitting: Oncology

## 2014-04-27 ENCOUNTER — Encounter (HOSPITAL_BASED_OUTPATIENT_CLINIC_OR_DEPARTMENT_OTHER): Payer: Medicare Other | Admitting: Oncology

## 2014-04-27 ENCOUNTER — Ambulatory Visit (HOSPITAL_BASED_OUTPATIENT_CLINIC_OR_DEPARTMENT_OTHER): Payer: Medicare Other

## 2014-04-27 ENCOUNTER — Telehealth: Payer: Self-pay | Admitting: *Deleted

## 2014-04-27 DIAGNOSIS — C50812 Malignant neoplasm of overlapping sites of left female breast: Secondary | ICD-10-CM

## 2014-04-27 DIAGNOSIS — C50919 Malignant neoplasm of unspecified site of unspecified female breast: Secondary | ICD-10-CM

## 2014-04-27 DIAGNOSIS — J910 Malignant pleural effusion: Secondary | ICD-10-CM

## 2014-04-27 DIAGNOSIS — C50912 Malignant neoplasm of unspecified site of left female breast: Secondary | ICD-10-CM

## 2014-04-27 DIAGNOSIS — C7802 Secondary malignant neoplasm of left lung: Secondary | ICD-10-CM

## 2014-04-27 DIAGNOSIS — Z5111 Encounter for antineoplastic chemotherapy: Secondary | ICD-10-CM

## 2014-04-27 LAB — COMPREHENSIVE METABOLIC PANEL (CC13)
ALT: 13 U/L (ref 0–55)
ANION GAP: 11 meq/L (ref 3–11)
AST: 15 U/L (ref 5–34)
Albumin: 3.7 g/dL (ref 3.5–5.0)
Alkaline Phosphatase: 75 U/L (ref 40–150)
BUN: 14.8 mg/dL (ref 7.0–26.0)
CO2: 28 meq/L (ref 22–29)
CREATININE: 0.8 mg/dL (ref 0.6–1.1)
Calcium: 9.6 mg/dL (ref 8.4–10.4)
Chloride: 104 mEq/L (ref 98–109)
EGFR: 64 mL/min/{1.73_m2} — AB (ref >90)
Glucose: 96 mg/dl (ref 70–140)
Potassium: 4.1 mEq/L (ref 3.5–5.1)
Sodium: 142 mEq/L (ref 136–145)
Total Bilirubin: 0.43 mg/dL (ref 0.20–1.20)
Total Protein: 6.2 g/dL — ABNORMAL LOW (ref 6.4–8.3)

## 2014-04-27 LAB — CBC WITH DIFFERENTIAL/PLATELET
BASO%: 1 % (ref 0.0–2.0)
BASOS ABS: 0.1 10*3/uL (ref 0.0–0.1)
EOS%: 2.2 % (ref 0.0–7.0)
Eosinophils Absolute: 0.2 10*3/uL (ref 0.0–0.5)
HCT: 41.2 % (ref 34.8–46.6)
HEMOGLOBIN: 13.7 g/dL (ref 11.6–15.9)
LYMPH#: 2.6 10*3/uL (ref 0.9–3.3)
LYMPH%: 34.8 % (ref 14.0–49.7)
MCH: 29.9 pg (ref 25.1–34.0)
MCHC: 33.2 g/dL (ref 31.5–36.0)
MCV: 89.9 fL (ref 79.5–101.0)
MONO#: 0.6 10*3/uL (ref 0.1–0.9)
MONO%: 7.5 % (ref 0.0–14.0)
NEUT%: 54.5 % (ref 38.4–76.8)
NEUTROS ABS: 4.1 10*3/uL (ref 1.5–6.5)
Platelets: 210 10*3/uL (ref 145–400)
RBC: 4.58 10*6/uL (ref 3.70–5.45)
RDW: 13.9 % (ref 11.2–14.5)
WBC: 7.6 10*3/uL (ref 3.9–10.3)

## 2014-04-27 MED ORDER — FULVESTRANT 250 MG/5ML IM SOLN
500.0000 mg | Freq: Once | INTRAMUSCULAR | Status: AC
  Administered 2014-04-27: 500 mg via INTRAMUSCULAR
  Filled 2014-04-27: qty 10

## 2014-04-27 NOTE — Patient Instructions (Signed)
Fulvestrant injection  What is this medicine?  FULVESTRANT (ful VES trant) blocks the effects of estrogen. It is used to treat breast cancer in women past the age of menopause.  This medicine may be used for other purposes; ask your health care provider or pharmacist if you have questions.  COMMON BRAND NAME(S): FASLODEX  What should I tell my health care provider before I take this medicine?  They need to know if you have any of these conditions:  -bleeding problems  -liver disease  -low levels of platelets in the blood  -an unusual or allergic reaction to fulvestrant, other medicines, foods, dyes, or preservatives  -pregnant or trying to get pregnant  -breast-feeding  How should I use this medicine?  This medicine is for injection into a muscle. It is usually given by a health care professional in a hospital or clinic setting.  Talk to your pediatrician regarding the use of this medicine in children. Special care may be needed.  Overdosage: If you think you have taken too much of this medicine contact a poison control center or emergency room at once.  NOTE: This medicine is only for you. Do not share this medicine with others.  What if I miss a dose?  It is important not to miss your dose. Call your doctor or health care professional if you are unable to keep an appointment.  What may interact with this medicine?  -medicines that treat or prevent blood clots like warfarin, enoxaparin, and dalteparin  This list may not describe all possible interactions. Give your health care provider a list of all the medicines, herbs, non-prescription drugs, or dietary supplements you use. Also tell them if you smoke, drink alcohol, or use illegal drugs. Some items may interact with your medicine.  What should I watch for while using this medicine?  Your condition will be monitored carefully while you are receiving this medicine. You will need important blood work done while you are taking this medicine.  Do not become pregnant  while taking this medicine. Women should inform their doctor if they wish to become pregnant or think they might be pregnant. There is a potential for serious side effects to an unborn child. Talk to your health care professional or pharmacist for more information.  What side effects may I notice from receiving this medicine?  Side effects that you should report to your doctor or health care professional as soon as possible:  -allergic reactions like skin rash, itching or hives, swelling of the face, lips, or tongue  -feeling faint or lightheaded, falls  -fever or flu-like symptoms  -sore throat  -vaginal bleeding  Side effects that usually do not require medical attention (report to your doctor or health care professional if they continue or are bothersome):  -aches, pains  -constipation or diarrhea  -headache  -hot flashes  -nausea, vomiting  -pain at site where injected  -stomach pain  This list may not describe all possible side effects. Call your doctor for medical advice about side effects. You may report side effects to FDA at 1-800-FDA-1088.  Where should I keep my medicine?  This drug is given in a hospital or clinic and will not be stored at home.  NOTE: This sheet is a summary. It may not cover all possible information. If you have questions about this medicine, talk to your doctor, pharmacist, or health care provider.   2015, Elsevier/Gold Standard. (2007-09-02 15:39:24)

## 2014-04-27 NOTE — Telephone Encounter (Signed)
Ativan rx called into [facility]

## 2014-04-29 ENCOUNTER — Ambulatory Visit: Payer: Medicare Other

## 2014-04-29 ENCOUNTER — Other Ambulatory Visit: Payer: Medicare Other

## 2014-05-28 ENCOUNTER — Ambulatory Visit: Payer: Medicare Other | Admitting: Oncology

## 2014-05-28 ENCOUNTER — Ambulatory Visit (HOSPITAL_BASED_OUTPATIENT_CLINIC_OR_DEPARTMENT_OTHER): Payer: Medicare Other

## 2014-05-28 ENCOUNTER — Other Ambulatory Visit (HOSPITAL_BASED_OUTPATIENT_CLINIC_OR_DEPARTMENT_OTHER): Payer: Medicare Other

## 2014-05-28 DIAGNOSIS — C50912 Malignant neoplasm of unspecified site of left female breast: Secondary | ICD-10-CM

## 2014-05-28 DIAGNOSIS — C50919 Malignant neoplasm of unspecified site of unspecified female breast: Secondary | ICD-10-CM

## 2014-05-28 DIAGNOSIS — J910 Malignant pleural effusion: Secondary | ICD-10-CM

## 2014-05-28 DIAGNOSIS — Z5111 Encounter for antineoplastic chemotherapy: Secondary | ICD-10-CM | POA: Diagnosis not present

## 2014-05-28 DIAGNOSIS — C50812 Malignant neoplasm of overlapping sites of left female breast: Secondary | ICD-10-CM | POA: Diagnosis not present

## 2014-05-28 DIAGNOSIS — C7802 Secondary malignant neoplasm of left lung: Secondary | ICD-10-CM

## 2014-05-28 DIAGNOSIS — C50911 Malignant neoplasm of unspecified site of right female breast: Secondary | ICD-10-CM

## 2014-05-28 LAB — COMPREHENSIVE METABOLIC PANEL (CC13)
ALK PHOS: 80 U/L (ref 40–150)
ALT: 7 U/L (ref 0–55)
AST: 13 U/L (ref 5–34)
Albumin: 3.8 g/dL (ref 3.5–5.0)
Anion Gap: 11 mEq/L (ref 3–11)
BILIRUBIN TOTAL: 0.3 mg/dL (ref 0.20–1.20)
BUN: 10.8 mg/dL (ref 7.0–26.0)
CO2: 26 meq/L (ref 22–29)
CREATININE: 0.8 mg/dL (ref 0.6–1.1)
Calcium: 9 mg/dL (ref 8.4–10.4)
Chloride: 104 mEq/L (ref 98–109)
EGFR: 67 mL/min/{1.73_m2} — ABNORMAL LOW (ref >90)
GLUCOSE: 90 mg/dL (ref 70–140)
Potassium: 4.6 mEq/L (ref 3.5–5.1)
Sodium: 141 mEq/L (ref 136–145)
TOTAL PROTEIN: 6.4 g/dL (ref 6.4–8.3)

## 2014-05-28 LAB — CBC WITH DIFFERENTIAL/PLATELET
BASO%: 1.5 % (ref 0.0–2.0)
Basophils Absolute: 0.1 10*3/uL (ref 0.0–0.1)
EOS%: 2.7 % (ref 0.0–7.0)
Eosinophils Absolute: 0.2 10*3/uL (ref 0.0–0.5)
HCT: 42.5 % (ref 34.8–46.6)
HGB: 13.8 g/dL (ref 11.6–15.9)
LYMPH#: 2.9 10*3/uL (ref 0.9–3.3)
LYMPH%: 33.5 % (ref 14.0–49.7)
MCH: 29.5 pg (ref 25.1–34.0)
MCHC: 32.5 g/dL (ref 31.5–36.0)
MCV: 91 fL (ref 79.5–101.0)
MONO#: 0.6 10*3/uL (ref 0.1–0.9)
MONO%: 7 % (ref 0.0–14.0)
NEUT%: 55.3 % (ref 38.4–76.8)
NEUTROS ABS: 4.7 10*3/uL (ref 1.5–6.5)
Platelets: 213 10*3/uL (ref 145–400)
RBC: 4.67 10*6/uL (ref 3.70–5.45)
RDW: 14.3 % (ref 11.2–14.5)
WBC: 8.6 10*3/uL (ref 3.9–10.3)

## 2014-05-28 MED ORDER — FULVESTRANT 250 MG/5ML IM SOLN
500.0000 mg | Freq: Once | INTRAMUSCULAR | Status: AC
  Administered 2014-05-28: 500 mg via INTRAMUSCULAR
  Filled 2014-05-28: qty 10

## 2014-06-10 DIAGNOSIS — J449 Chronic obstructive pulmonary disease, unspecified: Secondary | ICD-10-CM | POA: Diagnosis not present

## 2014-06-10 DIAGNOSIS — F17210 Nicotine dependence, cigarettes, uncomplicated: Secondary | ICD-10-CM | POA: Diagnosis not present

## 2014-06-10 DIAGNOSIS — E039 Hypothyroidism, unspecified: Secondary | ICD-10-CM | POA: Diagnosis not present

## 2014-06-10 DIAGNOSIS — C50911 Malignant neoplasm of unspecified site of right female breast: Secondary | ICD-10-CM | POA: Diagnosis not present

## 2014-07-01 ENCOUNTER — Ambulatory Visit: Payer: Medicare Other

## 2014-07-01 ENCOUNTER — Telehealth: Payer: Self-pay | Admitting: Oncology

## 2014-07-01 ENCOUNTER — Other Ambulatory Visit (HOSPITAL_BASED_OUTPATIENT_CLINIC_OR_DEPARTMENT_OTHER): Payer: Medicare Other

## 2014-07-01 ENCOUNTER — Ambulatory Visit (HOSPITAL_BASED_OUTPATIENT_CLINIC_OR_DEPARTMENT_OTHER): Payer: Medicare Other | Admitting: Oncology

## 2014-07-01 VITALS — BP 117/78 | HR 71 | Temp 97.6°F | Resp 18 | Ht 63.0 in | Wt 124.9 lb

## 2014-07-01 DIAGNOSIS — J910 Malignant pleural effusion: Secondary | ICD-10-CM

## 2014-07-01 DIAGNOSIS — C50912 Malignant neoplasm of unspecified site of left female breast: Secondary | ICD-10-CM

## 2014-07-01 DIAGNOSIS — C50919 Malignant neoplasm of unspecified site of unspecified female breast: Secondary | ICD-10-CM

## 2014-07-01 DIAGNOSIS — Z720 Tobacco use: Secondary | ICD-10-CM | POA: Diagnosis not present

## 2014-07-01 DIAGNOSIS — J438 Other emphysema: Secondary | ICD-10-CM

## 2014-07-01 DIAGNOSIS — I700 Atherosclerosis of aorta: Secondary | ICD-10-CM

## 2014-07-01 DIAGNOSIS — C50911 Malignant neoplasm of unspecified site of right female breast: Secondary | ICD-10-CM | POA: Diagnosis not present

## 2014-07-01 DIAGNOSIS — C778 Secondary and unspecified malignant neoplasm of lymph nodes of multiple regions: Secondary | ICD-10-CM

## 2014-07-01 DIAGNOSIS — C782 Secondary malignant neoplasm of pleura: Secondary | ICD-10-CM | POA: Diagnosis not present

## 2014-07-01 LAB — COMPREHENSIVE METABOLIC PANEL (CC13)
ALT: 8 U/L (ref 0–55)
ANION GAP: 10 meq/L (ref 3–11)
AST: 12 U/L (ref 5–34)
Albumin: 3.9 g/dL (ref 3.5–5.0)
Alkaline Phosphatase: 82 U/L (ref 40–150)
BUN: 11.9 mg/dL (ref 7.0–26.0)
CALCIUM: 9.6 mg/dL (ref 8.4–10.4)
CHLORIDE: 104 meq/L (ref 98–109)
CO2: 27 meq/L (ref 22–29)
CREATININE: 0.8 mg/dL (ref 0.6–1.1)
EGFR: 65 mL/min/{1.73_m2} — ABNORMAL LOW (ref >90)
Glucose: 95 mg/dl (ref 70–140)
POTASSIUM: 4.5 meq/L (ref 3.5–5.1)
SODIUM: 141 meq/L (ref 136–145)
TOTAL PROTEIN: 6.4 g/dL (ref 6.4–8.3)
Total Bilirubin: 0.39 mg/dL (ref 0.20–1.20)

## 2014-07-01 LAB — CBC WITH DIFFERENTIAL/PLATELET
BASO%: 1.2 % (ref 0.0–2.0)
Basophils Absolute: 0.1 10*3/uL (ref 0.0–0.1)
EOS%: 1.9 % (ref 0.0–7.0)
Eosinophils Absolute: 0.1 10*3/uL (ref 0.0–0.5)
HCT: 42.5 % (ref 34.8–46.6)
HGB: 14 g/dL (ref 11.6–15.9)
LYMPH#: 3.1 10*3/uL (ref 0.9–3.3)
LYMPH%: 40 % (ref 14.0–49.7)
MCH: 29.8 pg (ref 25.1–34.0)
MCHC: 32.9 g/dL (ref 31.5–36.0)
MCV: 90.6 fL (ref 79.5–101.0)
MONO#: 0.5 10*3/uL (ref 0.1–0.9)
MONO%: 6.8 % (ref 0.0–14.0)
NEUT#: 3.9 10*3/uL (ref 1.5–6.5)
NEUT%: 50.1 % (ref 38.4–76.8)
PLATELETS: 225 10*3/uL (ref 145–400)
RBC: 4.69 10*6/uL (ref 3.70–5.45)
RDW: 13.9 % (ref 11.2–14.5)
WBC: 7.9 10*3/uL (ref 3.9–10.3)

## 2014-07-01 MED ORDER — LORAZEPAM 1 MG PO TABS: ORAL_TABLET | ORAL | Status: DC

## 2014-07-01 MED ORDER — ANASTROZOLE 1 MG PO TABS: 1.0000 mg | ORAL_TABLET | Freq: Every day | ORAL | Status: DC

## 2014-07-01 NOTE — Telephone Encounter (Signed)
per pof to sch pt appt-gave pt copy of sch °

## 2014-07-01 NOTE — Progress Notes (Signed)
[name]  [phone] Fax:([phone]) [phone]     ID: Brianna Duke OB: 79-Nov-1929  MR#: [id number]  GYB#:[id number]  PCP: Brianna Low, MD GYN:   SU:  OTHER MD: Brianna Duke, Brianna Duke  CHIEF COMPLAINT: Metastatic breast cancer CURRENT TREATMENT: Fulvestrant  BREAST CANCER HISTORY: From Dr Brianna Duke intake note dated 11/05/2007:  "She presented to Dr. Lysle Duke back in February for a complete physical exam.  She has a history of breast cancer treated back in 1992 by Dr. Danny Duke.  She had had a left lumpectomy by Dr. Kathrin Duke for a reasonably large lesion, although I do not have any of the detail and she also had lymph nodes involved at that time, which I believe, 5 of 15 nodes were involved.  Pathology department no longer has this information.  She did have external beam radiation therapy for this.  We are awaiting that information as well.  For her complete physical, he recommended screening mammogram.  This was performed March 3 and it showed a possible mass noted in the right breast.  Spot compression views and possibly ultrasound were recommended for further evaluation.  Left breast was unremarkable.  She came back for that on March 27 and this showed that the question small mass in the right breast at the 12 o'clock position persisted measuring 4 cm from the nipple, a 0.76 x 0.9 cm hypoechoic mass correlating to the mammographic findings.  They recommended further workup.  On March 30, she had a core biopsy and this showed invasive ductal carcinoma ER/PR positive, HER-2/neu negative.  She went on to have an MRI of the bilateral breasts on April 9 and it showed a 1.1 x 0.8 x 0.5 known invasive ductal carcinoma at the 12 o'clock position of the right breast with an adjacent 3 mm possible satellite lesion immediately adjacent to the posteroinferior aspect of this mass.  There were multiple additional right breast nodules 4 mm or less in maximum  diameter most likely representing normal background nodular parenchyma.  None of these were large enough to warrant a biopsy at the time.  There was no background parenchymal nodularity or enhancement in the left breast possibly due to previous radiation.  There was asymmetrical and mildly prominent right axillary and retropectoral lymph nodes and a second look ultrasound of the right axilla was recommended.  She was given an ultrasound appointment at 11 a.m. on April 10; however, she called in and canceled that appointment saying she does not want to reschedule.  She told me that she had gone up to [place] and was considering not having any kind of treatment at all.  She had multiple small liver masses also noted on MRI, which were too small to accurately characterize on the current images, moderate-sized hiatal hernia.  She then finally returned to town and agreed to have surgery and chest x-ray on May 21, showed slight hyperinflation but no acute process seen.  She went on to have her lumpectomy and sentinel node biopsy by Dr. Brantley Duke on May 27.  This showed a 1.1 cm invasive ductal carcinoma grade 1 with Duke-grade DCIS with the DCIS margin being less than 0.1 cm in the lateral superficial resection margins.  Two sentinel nodes were negative.  Dr. Brantley Duke discussed with her the need for radiation and has sent her here for our opinion regarding the role of radiation.  He also discussed with her hormone receptor status being positive, but the patient declined taking any sort of  medication or even having a medical oncology referral."  The patient did complete radiation to the right breast 12/14/2007, with a boost to a total dose of 6280 cGy . She was subsequently lost to followup.  More recently the patient noted left sided supraclavicular adenopathy. She brought this to the attention of Dr. Ernesto Duke who obtained a chest CT 07/16/2013 at [facility]. This showed left supraclavicular and anterior mediastinal  adenopathy, a left-sided pleural effusion on with evidence of left-sided pleural disease, a left adrenal mass and probable retroperitoneal adenopathy, several splenic lesions, some right retrocrural adenopathy, nonspecific liver lesions and also evidence of aortic atherosclerosis and emphysema. On 07/21/2013 the patient underwent left thoracentesis, in the pleural fluid showed ([id number]) malignant cells consistent with breast adenocarcinoma, estrogen receptor 35% positive, progesterone receptor 22% positive, with no HER-2 amplification, the signals ratio being 1.40 and the number per cell 1.75.  Her subsequent history is as detailed below.  INTERVAL HISTORY: Brianna Duke returns today for followup of her metastatic breast cancer. She has been on fulvestrant for almost a year, with excellent tolerance and very good results as far as I can tell. However she would like to change. Part of the problem is that she has lost a little weight and that makes her anxious. She was to start an eating and exercise program. She thinks if she goes" one of those pills" she will gain some weight. Of course the other issue is that nobody likes to get those shots once a month.  ROS: Unfortunately she continues to smoke, currently at 1 pack per day. She has a little bit of a cough and little bit of phlegm production in the morning but it is clear, not purulent, not bloody. She denies worsening shortness of breath, pleurisy on the or significant cough problems. She is able to exercise without restrictions. She has had no unusual headaches visual changes, nausea, vomiting, difficulty swallowing, pain on swallowing, or change in bowel or bladder habits. Sometimes she has a crampy feeling in her right leg and eventually it "pops" and goes back to normal. A detailed review of systems today was otherwise stable.  PAST MEDICAL HISTORY: Past Medical History  Diagnosis Date  . Hyperlipidemia   . Bronchitis     history  . Retinal  detachment     history of  . COPD (chronic obstructive pulmonary disease)   . Meniere's disease     surgery  . History of radiation therapy 06/15/00- 08/22/00    left breast, 5940 cGy 33 fractions  . Hx of radiation therapy 11/18/07- 01/02/08    right breast 2880 cGy 16 fractions, right breast 2000 cGy 10 fractions, right breast boost 1400 cGy 7 fractions  . Anxiety   . Varicose veins   . Hypertension   . Thyroid disease     hypothyroid  . Insomnia   . Chronic kidney disease     Duke 2  . Breast cancer 05/31/00     left lower outer   . Malignant neoplasm of breast (female), unspecified site 10/02/07    right 12 o'clock , invasive ductal  . Skin cancer     squamous cell- face  . Baker's cyst of knee     right knee  . Metastases to the liver     breast primary  . 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 Metastasis to adrenal gland   . Pleural effusion, malignant 07/21/13    left lung, breast primary  . Cataract  surgery onboth  . Depression     PAST SURGICAL HISTORY: Past Surgical History  Procedure Laterality Date  . Appendectomy      81 yrs old  . Pituitary surgery  2005    adenoma removed  . Thyroidectomy, partial  1991  . Tonsillectomy      age 52    FAMILY HISTORY Family History  Problem Relation Age of Onset  . Cancer Mother   . Cancer Sister     ovarian  . Cancer Sister 58    breast   the patient's father died at the age of 44 in a tractor accident. The patient is one of 9 siblings. Only at 2 siblings survive in addition to her. One is her brother Carmin Muskrat who lives in [place], and sister Alisia Ferrari who lives in [place].Marland KitchenHe can be reached at [phone].   GYNECOLOGIC HISTORY:  Menarche age 44. The patient is GX P0. 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 She moved to  [place] from [place] around that time. She is single. She lives alone, with no pets. She attends the good [employer]. In addition to her siblings, listed above, she frequently visits her niece Meryl Dare in [place]. She can be reached at [phone], and she is also close to her friend Renne Musca who lives in [place].  ADVANCED DIRECTIVES: Not in place; on her 09/04/2013 visit the patient was given healthcare power of attorney and living will documents to complete and notarize at her discretion; "the family should take care of all that" if something happens to her   HEALTH MAINTENANCE: History  Substance Use Topics  . Smoking status: Current Every Day Smoker -- 1.00 packs/day for 30 years    Types: Cigarettes  . Smokeless tobacco: Not on file  . Alcohol Use: Yes     Comment: rarely   No Known Allergies  Current Outpatient Prescriptions  Medication Sig Dispense Refill  . levothyroxine (SYNTHROID, LEVOTHROID) 75 MCG tablet Take 75 mcg by mouth daily before breakfast.    . LORazepam (ATIVAN) 1 MG tablet TAKE 1 TABLET TWICE A DAY AS NEEDED FOR ANXIETY 30 tablet 1  . Multiple Minerals (CALCIUM/MAGNESIUM/ZINC PO) Take 1 tablet by mouth every morning.    . naproxen sodium (ANAPROX) 220 MG tablet Take 220 mg by mouth 2 (two) times daily as needed.     . Omega-3 Fatty Acids (FISH OIL) 1000 MG CAPS Take 2 capsules by mouth daily.    . Vitamin D, Ergocalciferol, (DRISDOL) 50000 UNITS CAPS capsule Take 1,000 Units by mouth daily.     No current facility-administered medications for this visit.    OBJECTIVE: Older white woman in no acute distress  Filed Vitals:   07/01/14 1013  BP: 117/78  Pulse: 71  Temp: 97.6 F (36.4 C)  Resp: 18     Body mass index is 22.13 kg/(m^2).      ECOG FS:1 - Symptomatic but completely ambulatory  Sclerae unicteric, pupils equal and reactive Oropharynx clear and moist-- no thrush or other lesions  No cervical or supraclavicular adenopathy Lungs  no rales or rhonchi Heart regular rate and rhythm Abd soft, nontender, positive bowel sounds MSK no focal spinal tenderness, no upper extremity lymphedema, no tenderness to palpation in the right hip and no swelling or tenderness to palpation in  the right lower leg Neuro: nonfocal, well oriented, friendly affect Breasts: The right breast is status post lumpectomy and radiation. There is no evidence of local recurrence. The right axilla is benign. The left breast is status post lumpectomy and radiation as well. There is no evidence of local recurrence. Left axilla is benign.  LAB RESULTS:  CMP     Component Value Date/Time   NA 141 07/01/2014 0951   NA 138 09/26/2007 1206   K 4.5 07/01/2014 0951   K 3.6 09/26/2007 1206   CL 104 09/26/2007 1206   CO2 27 07/01/2014 0951   CO2 24 09/26/2007 1206   GLUCOSE 95 07/01/2014 0951   GLUCOSE 118* 09/26/2007 1206   BUN 11.9 07/01/2014 0951   BUN 8 09/26/2007 1206   CREATININE 0.8 07/01/2014 0951   CREATININE 0.78 09/26/2007 1206   CALCIUM 9.6 07/01/2014 0951   CALCIUM 9.1 09/26/2007 1206   PROT 6.4 07/01/2014 0951   ALBUMIN 3.9 07/01/2014 0951   AST 12 07/01/2014 0951   ALT 8 07/01/2014 0951   ALKPHOS 82 07/01/2014 0951   BILITOT 0.39 07/01/2014 0951   GFRNONAA >60 09/26/2007 1206   GFRAA  09/26/2007 1206    >60        The eGFR has been calculated using the MDRD equation. This calculation has not been validated in all clinical   I No results found for: SPEP  Lab Results  Component Value Date   WBC 7.9 07/01/2014   NEUTROABS 3.9 07/01/2014   HGB 14.0 07/01/2014   HCT 42.5 07/01/2014   MCV 90.6 07/01/2014   PLT 225 07/01/2014      Chemistry      Component Value Date/Time   NA 141 07/01/2014 0951   NA 138 09/26/2007 1206   K 4.5 07/01/2014 0951   K 3.6 09/26/2007 1206   CL 104 09/26/2007 1206   CO2 27 07/01/2014 0951   CO2 24 09/26/2007 1206   BUN 11.9 07/01/2014 0951   BUN 8 09/26/2007 1206   CREATININE 0.8  07/01/2014 0951   CREATININE 0.78 09/26/2007 1206      Component Value Date/Time   CALCIUM 9.6 07/01/2014 0951   CALCIUM 9.1 09/26/2007 1206   ALKPHOS 82 07/01/2014 0951   AST 12 07/01/2014 0951   ALT 8 07/01/2014 0951   BILITOT 0.39 07/01/2014 0951       No results found for: LABCA2  No components found for: [id number]  No results for input(s): INR in the last 168 hours.  Urinalysis No results found for: COLORURINE  STUDIES: No results found.  ASSESSMENT: 81 y.o. [place] woman with a history of bilateral breast cancer, now Duke IV, as follows  (1) LEFT BREAST:  (a) status post left excisional biopsy 05/14/2000 for a pT2 NX, Duke II invasive lobular breast cancer, estrogen receptor 83% positive, progesterone receptor 90% positive, HER-2 negative, with positive margins   (b) left breast reexcision for margin clearance, with left axillary lymph node dissection 05/31/2000, with negative margins but 5/15 axillary lymph nodes involved, final Duke pT2 pN2, Duke IIIA  (c) status post radiation to the left breast (not to axilla)  (d) on tamoxifen approximately 5 years per patient report  (2) RIGHT BREAST:  (a) status post right lumpectomy and sentinel lymph node sampling 05/272009 for a pT1c pN0, Duke IA invasive ductal carcinoma, grade 1, estrogen receptor 99% positive, progesterone receptor and 98% positive, with an MIB-1 of 11%, and no HER-2 amplification.  (b) status post radiation to  the right breast completed August 2009  (c) the patient refused antiestrogen therapy and medical oncology evaluation  (3) METASTATIC DISEASE:  (a) staging studies March and April 2015 show extensive metastatic adenopathy (left supraclavicular and left axillary,. retrocrural, etc.), with splenic involvement, and with significant left pleural involvement and a large left pleural effusion; bone involvement was questionable  (b) left thoracentesis 07/21/2013 confirms a malignant effusion, with  adenocarcinoma cells positive for estrogen and progesterone receptor, negative for HER-2 amplification  (c) fulvestrant started 09/04/2013, discontinued at patient's request after 05/28/2014 dose despite continuing response  (d) anastrozole started 07/01/2014  PLAN: Jonika has been doing remarkably well on the fulvestrant, as far as I can tell. However she does not like receiving the shots, which is understandable. She is also anxious because she has lost a few pounds. She was to go on "1 of those pills". She thinks that that will help her gain weight and have more energy.  Of course I am concerned that with the fulvestrant at least we know that she is taking the medication, and when we switched to anastrozole compliance may become more of an issue. I have explained to her that she really does need to take the pill every day if she is going to be on the pill. She is a registered 4 to person and she will let us know whether she is able to do that or not.  I went ahead and put the anastrozole prescription in her pharmacy. She also requested a and Ativan refill. I have made her a return appointment in 3 weeks just to make sure everything is going according to plan. She will have a repeat chest x-ray just before that visit and that will serve as her new baseline.  Incidentally I also gave her a copy of the [employer]. She is interested in exercise, which I think is wonderful. Hopefully she will be able to enroll in one of their programs.  Sheral knows the goal of treatment in her case is control. She agrees with the overall plan. She will call for any problems that may develop before the next visit here.  Chauncey Cruel, MD  07/01/2014 10:30 AM

## 2014-07-10 ENCOUNTER — Telehealth: Payer: Self-pay | Admitting: Oncology

## 2014-07-10 NOTE — Telephone Encounter (Signed)
mailed appt schedule for march 2016.

## 2014-07-13 DIAGNOSIS — I83813 Varicose veins of bilateral lower extremities with pain: Secondary | ICD-10-CM | POA: Diagnosis not present

## 2014-07-14 ENCOUNTER — Telehealth: Payer: Self-pay | Admitting: Oncology

## 2014-07-14 NOTE — Telephone Encounter (Signed)
PRINTED AND FAXED MEDICAL RECORDS TO  [facility] ON 07/14/14.  TG

## 2014-07-21 DIAGNOSIS — I83813 Varicose veins of bilateral lower extremities with pain: Secondary | ICD-10-CM | POA: Diagnosis not present

## 2014-07-22 ENCOUNTER — Telehealth: Payer: Self-pay | Admitting: *Deleted

## 2014-07-22 ENCOUNTER — Ambulatory Visit: Payer: Medicare Other | Admitting: Nurse Practitioner

## 2014-07-22 NOTE — Telephone Encounter (Signed)
Called pt to inquire about today's appt. Pt did not come to appt today. No answer but left a detailed message to VM and told her to call this nurse back @ [phone] so we can r/s this appt. Message to be forwarded to Gentry Fitz, NP,

## 2014-07-22 NOTE — Telephone Encounter (Signed)
Patient called about missing her appointment today.  She did not want me to transfer her to [facility] or  a scheduler to reschedule this appointment. She wants them to call her back.  Will route this message to [employer] / Gentry Fitz NP so she can see when patient needs to be rescheduled.  Patient's call back is [phone].

## 2014-07-23 DIAGNOSIS — I83813 Varicose veins of bilateral lower extremities with pain: Secondary | ICD-10-CM | POA: Diagnosis not present

## 2014-07-23 NOTE — Telephone Encounter (Signed)
Attempted to call pt this to inform her about r/s. No answer. I will call pt back.

## 2014-07-28 DIAGNOSIS — J320 Chronic maxillary sinusitis: Secondary | ICD-10-CM | POA: Diagnosis not present

## 2014-07-28 DIAGNOSIS — J370 Chronic laryngitis: Secondary | ICD-10-CM | POA: Diagnosis not present

## 2014-07-28 DIAGNOSIS — J322 Chronic ethmoidal sinusitis: Secondary | ICD-10-CM | POA: Diagnosis not present

## 2014-07-28 DIAGNOSIS — H6061 Unspecified chronic otitis externa, right ear: Secondary | ICD-10-CM | POA: Diagnosis not present

## 2014-07-29 DIAGNOSIS — G510 Bell's palsy: Secondary | ICD-10-CM | POA: Diagnosis not present

## 2014-07-29 DIAGNOSIS — H16142 Punctate keratitis, left eye: Secondary | ICD-10-CM | POA: Diagnosis not present

## 2014-08-03 DIAGNOSIS — G510 Bell's palsy: Secondary | ICD-10-CM | POA: Diagnosis not present

## 2014-08-03 DIAGNOSIS — H16142 Punctate keratitis, left eye: Secondary | ICD-10-CM | POA: Diagnosis not present

## 2014-08-12 DIAGNOSIS — I10 Essential (primary) hypertension: Secondary | ICD-10-CM | POA: Diagnosis not present

## 2014-08-12 DIAGNOSIS — C50911 Malignant neoplasm of unspecified site of right female breast: Secondary | ICD-10-CM | POA: Diagnosis not present

## 2014-08-12 DIAGNOSIS — J449 Chronic obstructive pulmonary disease, unspecified: Secondary | ICD-10-CM | POA: Diagnosis not present

## 2014-08-12 DIAGNOSIS — E039 Hypothyroidism, unspecified: Secondary | ICD-10-CM | POA: Diagnosis not present

## 2014-08-12 DIAGNOSIS — G510 Bell's palsy: Secondary | ICD-10-CM | POA: Diagnosis not present

## 2014-08-19 DIAGNOSIS — H16142 Punctate keratitis, left eye: Secondary | ICD-10-CM | POA: Diagnosis not present

## 2014-08-19 DIAGNOSIS — G510 Bell's palsy: Secondary | ICD-10-CM | POA: Diagnosis not present

## 2014-09-14 DIAGNOSIS — G510 Bell's palsy: Secondary | ICD-10-CM | POA: Diagnosis not present

## 2014-09-21 ENCOUNTER — Telehealth: Payer: Self-pay | Admitting: Oncology

## 2014-09-21 NOTE — Telephone Encounter (Signed)
Patient came in and schedule missed appointment in March. Patient confirmed appointment for 05/18.

## 2014-09-23 ENCOUNTER — Ambulatory Visit (HOSPITAL_BASED_OUTPATIENT_CLINIC_OR_DEPARTMENT_OTHER): Payer: Medicare Other | Admitting: Nurse Practitioner

## 2014-09-23 ENCOUNTER — Telehealth: Payer: Self-pay | Admitting: Nurse Practitioner

## 2014-09-23 VITALS — BP 121/69 | HR 63 | Temp 97.5°F | Resp 18 | Ht 63.0 in | Wt 127.8 lb

## 2014-09-23 DIAGNOSIS — C7889 Secondary malignant neoplasm of other digestive organs: Secondary | ICD-10-CM

## 2014-09-23 DIAGNOSIS — C50811 Malignant neoplasm of overlapping sites of right female breast: Secondary | ICD-10-CM

## 2014-09-23 DIAGNOSIS — C50912 Malignant neoplasm of unspecified site of left female breast: Secondary | ICD-10-CM | POA: Diagnosis not present

## 2014-09-23 DIAGNOSIS — Z720 Tobacco use: Secondary | ICD-10-CM

## 2014-09-23 DIAGNOSIS — C778 Secondary and unspecified malignant neoplasm of lymph nodes of multiple regions: Secondary | ICD-10-CM | POA: Diagnosis not present

## 2014-09-23 DIAGNOSIS — Z170 Estrogen receptor positive status [ER+]: Secondary | ICD-10-CM

## 2014-09-23 DIAGNOSIS — G510 Bell's palsy: Secondary | ICD-10-CM

## 2014-09-23 DIAGNOSIS — J910 Malignant pleural effusion: Secondary | ICD-10-CM

## 2014-09-23 DIAGNOSIS — C50911 Malignant neoplasm of unspecified site of right female breast: Secondary | ICD-10-CM

## 2014-09-23 DIAGNOSIS — C782 Secondary malignant neoplasm of pleura: Secondary | ICD-10-CM

## 2014-09-23 NOTE — Telephone Encounter (Signed)
Gave avs & calendar for June/July.  °

## 2014-09-23 NOTE — Progress Notes (Signed)
[place]  [phone] Fax:([phone]     ID: Brianna Duke OB: [birth year]  MR#: [id number]  XIP#:[id number]  PCP: [name], MD GYN:   SU:  OTHER MD: [name], [name]  CHIEF COMPLAINT: Metastatic breast cancer CURRENT TREATMENT: anastrozole (on hold)  BREAST CANCER HISTORY: From [name] intake note dated 11/05/2007:  "She presented to Dr. [name] back in February for a complete physical exam.  She has a history of breast cancer treated back in 1992 by Dr. [name].  She had had a left lumpectomy by Dr. [name] for a reasonably large lesion, although I do not have any of the detail and she also had lymph nodes involved at that time, which I believe, 5 of 15 nodes were involved.  Pathology department no longer has this information.  She did have external beam radiation therapy for this.  We are awaiting that information as well.  For her complete physical, he recommended screening mammogram.  This was performed March 3 and it showed a possible mass noted in the right breast.  Spot compression views and possibly ultrasound were recommended for further evaluation.  Left breast was unremarkable.  She came back for that on March 27 and this showed that the question small mass in the right breast at the 12 o'clock position persisted measuring 4 cm from the nipple, a 0.76 x 0.9 cm hypoechoic mass correlating to the mammographic findings.  They recommended further workup.  On March 30, she had a core biopsy and this showed invasive ductal carcinoma ER/PR positive, HER-2/neu negative.  She went on to have an MRI of the bilateral breasts on April 9 and it showed a 1.1 x 0.8 x 0.5 known invasive ductal carcinoma at the 12 o'clock position of the right breast with an adjacent 3 mm possible satellite lesion immediately adjacent to the posteroinferior aspect of this mass.  There were multiple additional right breast nodules 4 mm or less in  maximum diameter most likely representing normal background nodular parenchyma.  None of these were large enough to warrant a biopsy at the time.  There was no background parenchymal nodularity or enhancement in the left breast possibly due to previous radiation.  There was asymmetrical and mildly prominent right axillary and retropectoral lymph nodes and a second look ultrasound of the right axilla was recommended.  She was given an ultrasound appointment at 11 a.m. on April 10; however, she called in and canceled that appointment saying she does not want to reschedule.  She told me that she had gone up to [place] and was considering not having any kind of treatment at all.  She had multiple small liver masses also noted on MRI, which were too small to accurately characterize on the current images, moderate-sized hiatal hernia.  She then finally returned to town and agreed to have surgery and chest x-ray on May 21, showed slight hyperinflation but no acute process seen.  She went on to have her lumpectomy and sentinel node biopsy by [name] on May 27.  This showed a 1.1 cm invasive ductal carcinoma grade 1 with low-grade DCIS with the DCIS margin being less than 0.1 cm in the lateral superficial resection margins.  Two sentinel nodes were negative.  [name] discussed with her the need for radiation and has sent her here for our opinion regarding the role of radiation.  He also discussed with her hormone receptor status being positive, but the patient declined taking any  sort of medication or even having a medical oncology referral."  The patient did complete radiation to the right breast 12/14/2007, with a boost to a total dose of 6280 cGy . She was subsequently lost to followup.  More recently the patient noted left sided supraclavicular adenopathy. She brought this to the attention of Dr. [name] who obtained a chest CT 07/16/2013 at [facility]. This showed left supraclavicular and anterior  mediastinal adenopathy, a left-sided pleural effusion on with evidence of left-sided pleural disease, a left adrenal mass and probable retroperitoneal adenopathy, several splenic lesions, some right retrocrural adenopathy, nonspecific liver lesions and also evidence of aortic atherosclerosis and emphysema. On 07/21/2013 the patient underwent left thoracentesis, in the pleural fluid showed ([id number]) malignant cells consistent with breast adenocarcinoma, estrogen receptor 35% positive, progesterone receptor 22% positive, with no HER-2 amplification, the signals ratio being 1.40 and the number per cell 1.75.  Her subsequent history is as detailed below.  INTERVAL HISTORY: Brianna Duke returns today for follow up of her metastatic breast cancer. At her last visit she switched from fulvestrant injections to anastrozole per her request. She was on the anastrozole for a month before stopping it. She developed a left facial droop and was diagnosed with Bell's Palsy by her PCP. While she has been explained that typically Bell's Palsy is the result of exposure to a particular virus, she does not want to take any chance that it was because of the anastrozole. She wishes to return the the fulvestrant.   ROS: Brianna Duke denies fevers, chills, nausea, vomiting, or changes in bowel or bladder habits. Her appetite is somewhat better. She has occasional headaches, dizziness, and ear pain on the left side since her palsy began. When she sleeps she has to tape her left eyelid shut because it cannot stay closed on its own. She continues her 1ppd smoking habit. She has a chronic cough and clear phlegm production. She is short of breath, but denies chest pain, or palpitations. A detailed review of systems is otherwise stable.  PAST MEDICAL HISTORY: Past Medical History  Diagnosis Date  . Hyperlipidemia   . Bronchitis     history  . Retinal detachment     history of  . COPD (chronic obstructive pulmonary disease)   . Meniere's  disease     surgery  . History of radiation therapy 06/15/00- 08/22/00    left breast, 5940 cGy 33 fractions  . Hx of radiation therapy 11/18/07- 01/02/08    right breast 2880 cGy 16 fractions, right breast 2000 cGy 10 fractions, right breast boost 1400 cGy 7 fractions  . Anxiety   . Varicose veins   . Hypertension   . Thyroid disease     hypothyroid  . Insomnia   . Chronic kidney disease     stage 2  . Breast cancer 05/31/00     left lower outer   . Malignant neoplasm of breast (female), unspecified site 10/02/07    right 12 o'clock , invasive ductal  . Skin cancer     squamous cell- face  . Baker's cyst of knee     right knee  . Metastases to the liver     breast primary  . 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 Metastasis to adrenal gland   . Pleural effusion, malignant 07/21/13    left lung, breast primary  . Cataract     surgery onboth  . Depression     PAST SURGICAL HISTORY: Past Surgical History  Procedure Laterality Date  .  Appendectomy      81 yrs old  . Pituitary surgery  2005    adenoma removed  . Thyroidectomy, partial  1991  . Tonsillectomy      age 22    FAMILY HISTORY Family History  Problem Relation Age of Onset  . Cancer Mother   . Cancer Sister     ovarian  . Cancer Sister 57    breast   the patient's father died at the age of 26 in a tractor accident. The patient is one of 9 siblings. Only at 2 siblings survive in addition to her. One is her brother Carmin Muskrat who lives in [place], and sister Alisia Ferrari who lives in [place].Marland KitchenHe can be reached at [phone].   GYNECOLOGIC HISTORY:  Menarche age 52. The patient is GX P0. 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 She moved to [place] from [place] around that time. She is single. She lives alone, with no pets.  She attends the good [employer]. In addition to her siblings, listed above, she frequently visits her niece Meryl Dare in [place]. She can be reached at [phone], and she is also close to her friend Renne Musca who lives in [place].  ADVANCED DIRECTIVES: Not in place; on her 09/04/2013 visit the patient was given healthcare power of attorney and living will documents to complete and notarize at her discretion; "the family should take care of all that" if something happens to her   HEALTH MAINTENANCE: History  Substance Use Topics  . Smoking status: Current Every Day Smoker -- 1.00 packs/day for 30 years    Types: Cigarettes  . Smokeless tobacco: Not on file  . Alcohol Use: Yes     Comment: rarely   No Known Allergies  Current Outpatient Prescriptions  Medication Sig Dispense Refill  . levothyroxine (SYNTHROID, LEVOTHROID) 75 MCG tablet Take 75 mcg by mouth daily before breakfast.    . LORazepam (ATIVAN) 1 MG tablet TAKE 1 TABLET TWICE A DAY AS NEEDED FOR ANXIETY 30 tablet 1  . Omega-3 Fatty Acids (FISH OIL) 1000 MG CAPS Take 2 capsules by mouth daily.    Marland Kitchen anastrozole (ARIMIDEX) 1 MG tablet Take 1 tablet (1 mg total) by mouth daily. (Patient not taking: Reported on 09/23/2014) 90 tablet 4  . Multiple Minerals (CALCIUM/MAGNESIUM/ZINC PO) Take 1 tablet by mouth every morning.    . naproxen sodium (ANAPROX) 220 MG tablet Take 220 mg by mouth 2 (two) times daily as needed.      No current facility-administered medications for this visit.    OBJECTIVE: Older white Duke in no acute distress  Filed Vitals:   09/23/14 1453  BP: 121/69  Pulse: 63  Temp:   Resp:      Body mass index is 22.64 kg/(m^2).      ECOG FS:1 - Symptomatic but completely ambulatory  Skin: warm, dry  HEENT: sclerae anicteric, conjunctivae pink, oropharynx clear. No thrush or mucositis.  Lymph Nodes: No cervical or supraclavicular lymphadenopathy  Lungs: wheezing to bilateral upper lobes, lower lobe  clear, productive cough Heart: regular rate and rhythm  Abdomen: round, soft, non tender, positive bowel sounds  Musculoskeletal: No focal spinal tenderness, no peripheral edema  Neuro: left facial palsy, well oriented, positive affect  Breasts: bilateral breasts status post lumpectomy and radiation. No evidence of  recurrent disease. Bilateral axillae benign.   LAB RESULTS:  CMP     Component Value Date/Time   NA 141 07/01/2014 0951   NA 138 09/26/2007 1206   K 4.5 07/01/2014 0951   K 3.6 09/26/2007 1206   CL 104 09/26/2007 1206   CO2 27 07/01/2014 0951   CO2 24 09/26/2007 1206   GLUCOSE 95 07/01/2014 0951   GLUCOSE 118* 09/26/2007 1206   BUN 11.9 07/01/2014 0951   BUN 8 09/26/2007 1206   CREATININE 0.8 07/01/2014 0951   CREATININE 0.78 09/26/2007 1206   CALCIUM 9.6 07/01/2014 0951   CALCIUM 9.1 09/26/2007 1206   PROT 6.4 07/01/2014 0951   ALBUMIN 3.9 07/01/2014 0951   AST 12 07/01/2014 0951   ALT 8 07/01/2014 0951   ALKPHOS 82 07/01/2014 0951   BILITOT 0.39 07/01/2014 0951   GFRNONAA >60 09/26/2007 1206   GFRAA  09/26/2007 1206    >60        The eGFR has been calculated using the MDRD equation. This calculation has not been validated in all clinical   I No results found for: SPEP  Lab Results  Component Value Date   WBC 7.9 07/01/2014   NEUTROABS 3.9 07/01/2014   HGB 14.0 07/01/2014   HCT 42.5 07/01/2014   MCV 90.6 07/01/2014   PLT 225 07/01/2014      Chemistry      Component Value Date/Time   NA 141 07/01/2014 0951   NA 138 09/26/2007 1206   K 4.5 07/01/2014 0951   K 3.6 09/26/2007 1206   CL 104 09/26/2007 1206   CO2 27 07/01/2014 0951   CO2 24 09/26/2007 1206   BUN 11.9 07/01/2014 0951   BUN 8 09/26/2007 1206   CREATININE 0.8 07/01/2014 0951   CREATININE 0.78 09/26/2007 1206      Component Value Date/Time   CALCIUM 9.6 07/01/2014 0951   CALCIUM 9.1 09/26/2007 1206   ALKPHOS 82 07/01/2014 0951   AST 12 07/01/2014 0951   ALT 8 07/01/2014 0951    BILITOT 0.39 07/01/2014 0951       No results found for: LABCA2  No components found for: [id number]  No results for input(s): INR in the last 168 hours.  Urinalysis No results found for: COLORURINE  STUDIES: No results found.  ASSESSMENT: 81 y.o. Brianna Duke with a history of bilateral breast cancer, now stage IV, as follows  (1) LEFT BREAST:  (a) status post left excisional biopsy 05/14/2000 for a pT2 NX, stage II invasive lobular breast cancer, estrogen receptor 83% positive, progesterone receptor 90% positive, HER-2 negative, with positive margins   (b) left breast reexcision for margin clearance, with left axillary lymph node dissection 05/31/2000, with negative margins but 5/15 axillary lymph nodes involved, final stage pT2 pN2, stage IIIA  (c) status post radiation to the left breast (not to axilla)  (d) on tamoxifen approximately 5 years per patient report  (2) RIGHT BREAST:  (a) status post right lumpectomy and sentinel lymph node sampling 05/272009 for a pT1c pN0, stage IA invasive ductal carcinoma, grade 1, estrogen receptor 99% positive, progesterone receptor and 98% positive, with an MIB-1 of 11%, and no HER-2 amplification.  (b) status post radiation to the right breast completed August 2009  (c) the patient refused antiestrogen therapy and medical oncology evaluation  (3) METASTATIC DISEASE:  (a) staging studies March and April 2015 show extensive metastatic adenopathy (left supraclavicular and left axillary,. retrocrural, etc.), with splenic involvement, and with significant left pleural  involvement and a large left pleural effusion; bone involvement was questionable   (b) left thoracentesis 07/21/2013 confirms a malignant effusion, with adenocarcinoma cells positive for estrogen and progesterone receptor, negative for HER-2 amplification  (c) fulvestrant started 09/04/2013, discontinued at patient's request after 05/28/2014 dose despite continuing  response  (d) anastrozole started 07/01/2014, stopped after 1 month  (d) switched back to fulvestrant starting 10/08/14  PLAN: Although I believe her Bells' Palsy is unrelated to the anastrozole, I am happy to switch the patient back to the fulvestrant which she tolerated well and had an excellent response. She will start this back in the first week of June per her request.   Brianna Duke and I discussed tobacco cessation at length today. She is uninterested in nicotine patches or gum, but I was able to convince her to smoke one less cigarette weekly or monthly in order to work her way down to less than half a pack.   Brianna Duke will return in July to follow up with Dr. Jana Hakim. Prior to this visit I have asked for her to obtain a chest xray. She understands and agrees with this plan. She knows the goal of treatment in her case is control. She has been encouraged to call with any issues that might arise before her next visit here.  Brianna Panda, NP  09/23/2014 3:35 PM

## 2014-09-24 ENCOUNTER — Encounter: Payer: Self-pay | Admitting: Nurse Practitioner

## 2014-09-24 DIAGNOSIS — G510 Bell's palsy: Secondary | ICD-10-CM | POA: Diagnosis not present

## 2014-09-24 DIAGNOSIS — H6061 Unspecified chronic otitis externa, right ear: Secondary | ICD-10-CM | POA: Diagnosis not present

## 2014-09-29 ENCOUNTER — Other Ambulatory Visit: Payer: Self-pay | Admitting: Oncology

## 2014-10-07 ENCOUNTER — Other Ambulatory Visit: Payer: Self-pay

## 2014-10-07 DIAGNOSIS — J910 Malignant pleural effusion: Secondary | ICD-10-CM

## 2014-10-07 DIAGNOSIS — C50912 Malignant neoplasm of unspecified site of left female breast: Secondary | ICD-10-CM

## 2014-10-07 DIAGNOSIS — C50911 Malignant neoplasm of unspecified site of right female breast: Secondary | ICD-10-CM

## 2014-10-08 ENCOUNTER — Ambulatory Visit (HOSPITAL_BASED_OUTPATIENT_CLINIC_OR_DEPARTMENT_OTHER): Payer: Medicare Other

## 2014-10-08 ENCOUNTER — Ambulatory Visit: Payer: Medicare Other

## 2014-10-08 ENCOUNTER — Other Ambulatory Visit: Payer: Self-pay | Admitting: Oncology

## 2014-10-08 ENCOUNTER — Other Ambulatory Visit (HOSPITAL_BASED_OUTPATIENT_CLINIC_OR_DEPARTMENT_OTHER): Payer: Medicare Other

## 2014-10-08 VITALS — BP 107/88 | HR 70 | Temp 97.8°F

## 2014-10-08 DIAGNOSIS — C50912 Malignant neoplasm of unspecified site of left female breast: Secondary | ICD-10-CM

## 2014-10-08 DIAGNOSIS — C50811 Malignant neoplasm of overlapping sites of right female breast: Secondary | ICD-10-CM | POA: Diagnosis present

## 2014-10-08 DIAGNOSIS — Z5111 Encounter for antineoplastic chemotherapy: Secondary | ICD-10-CM

## 2014-10-08 DIAGNOSIS — J910 Malignant pleural effusion: Secondary | ICD-10-CM

## 2014-10-08 DIAGNOSIS — C50911 Malignant neoplasm of unspecified site of right female breast: Secondary | ICD-10-CM

## 2014-10-08 LAB — COMPREHENSIVE METABOLIC PANEL (CC13)
ALK PHOS: 78 U/L (ref 40–150)
ALT: 7 U/L (ref 0–55)
ANION GAP: 8 meq/L (ref 3–11)
AST: 14 U/L (ref 5–34)
Albumin: 3.5 g/dL (ref 3.5–5.0)
BUN: 14.7 mg/dL (ref 7.0–26.0)
CALCIUM: 9.1 mg/dL (ref 8.4–10.4)
CO2: 26 mEq/L (ref 22–29)
Chloride: 105 mEq/L (ref 98–109)
Creatinine: 0.9 mg/dL (ref 0.6–1.1)
EGFR: 61 mL/min/{1.73_m2} — AB (ref >90)
GLUCOSE: 95 mg/dL (ref 70–140)
Potassium: 4.4 mEq/L (ref 3.5–5.1)
Sodium: 140 mEq/L (ref 136–145)
Total Bilirubin: 0.29 mg/dL (ref 0.20–1.20)
Total Protein: 6.1 g/dL — ABNORMAL LOW (ref 6.4–8.3)

## 2014-10-08 LAB — CBC WITH DIFFERENTIAL/PLATELET
BASO%: 0.2 % (ref 0.0–2.0)
BASOS ABS: 0 10*3/uL (ref 0.0–0.1)
EOS%: 2.5 % (ref 0.0–7.0)
Eosinophils Absolute: 0.2 10*3/uL (ref 0.0–0.5)
HCT: 38.8 % (ref 34.8–46.6)
HEMOGLOBIN: 13.5 g/dL (ref 11.6–15.9)
LYMPH%: 38.8 % (ref 14.0–49.7)
MCH: 30.4 pg (ref 25.1–34.0)
MCHC: 34.8 g/dL (ref 31.5–36.0)
MCV: 87.4 fL (ref 79.5–101.0)
MONO#: 0.6 10*3/uL (ref 0.1–0.9)
MONO%: 7.1 % (ref 0.0–14.0)
NEUT%: 51.4 % (ref 38.4–76.8)
NEUTROS ABS: 4.4 10*3/uL (ref 1.5–6.5)
Platelets: 184 10*3/uL (ref 145–400)
RBC: 4.44 10*6/uL (ref 3.70–5.45)
RDW: 13.1 % (ref 11.2–14.5)
WBC: 8.5 10*3/uL (ref 3.9–10.3)
lymph#: 3.3 10*3/uL (ref 0.9–3.3)

## 2014-10-08 MED ORDER — FULVESTRANT 250 MG/5ML IM SOLN
500.0000 mg | Freq: Once | INTRAMUSCULAR | Status: AC
  Administered 2014-10-08: 500 mg via INTRAMUSCULAR
  Filled 2014-10-08: qty 10

## 2014-10-21 DIAGNOSIS — H16142 Punctate keratitis, left eye: Secondary | ICD-10-CM | POA: Diagnosis not present

## 2014-10-21 DIAGNOSIS — G510 Bell's palsy: Secondary | ICD-10-CM | POA: Diagnosis not present

## 2014-10-22 DIAGNOSIS — I83811 Varicose veins of right lower extremities with pain: Secondary | ICD-10-CM | POA: Diagnosis not present

## 2014-11-03 ENCOUNTER — Ambulatory Visit (HOSPITAL_COMMUNITY)
Admission: RE | Admit: 2014-11-03 | Discharge: 2014-11-03 | Disposition: A | Discharge status: Home / Self Care | Payer: Medicare Other | Source: Ambulatory Visit | Attending: Oncology | Admitting: Oncology

## 2014-11-03 DIAGNOSIS — K449 Diaphragmatic hernia without obstruction or gangrene: Secondary | ICD-10-CM | POA: Insufficient documentation

## 2014-11-03 DIAGNOSIS — R05 Cough: Secondary | ICD-10-CM | POA: Diagnosis present

## 2014-11-03 DIAGNOSIS — C50911 Malignant neoplasm of unspecified site of right female breast: Secondary | ICD-10-CM | POA: Diagnosis not present

## 2014-11-03 DIAGNOSIS — J438 Other emphysema: Secondary | ICD-10-CM

## 2014-11-03 DIAGNOSIS — J90 Pleural effusion, not elsewhere classified: Secondary | ICD-10-CM | POA: Diagnosis not present

## 2014-11-03 DIAGNOSIS — J9811 Atelectasis: Secondary | ICD-10-CM | POA: Insufficient documentation

## 2014-11-03 DIAGNOSIS — C50912 Malignant neoplasm of unspecified site of left female breast: Secondary | ICD-10-CM | POA: Insufficient documentation

## 2014-11-03 DIAGNOSIS — I700 Atherosclerosis of aorta: Secondary | ICD-10-CM

## 2014-11-04 DIAGNOSIS — I83811 Varicose veins of right lower extremities with pain: Secondary | ICD-10-CM | POA: Diagnosis not present

## 2014-11-04 DIAGNOSIS — I8311 Varicose veins of right lower extremity with inflammation: Secondary | ICD-10-CM | POA: Diagnosis not present

## 2014-11-05 ENCOUNTER — Telehealth: Payer: Self-pay

## 2014-11-05 NOTE — Telephone Encounter (Signed)
Let pt know per Dr. Jana Hakim CXR "looks good".  Pt questioned fluid - let her know test shows "stable" and that stable is good.  Pt voiced understanding.

## 2014-11-06 DIAGNOSIS — I83811 Varicose veins of right lower extremities with pain: Secondary | ICD-10-CM | POA: Diagnosis not present

## 2014-11-06 DIAGNOSIS — I8311 Varicose veins of right lower extremity with inflammation: Secondary | ICD-10-CM | POA: Diagnosis not present

## 2014-11-11 ENCOUNTER — Other Ambulatory Visit: Payer: Self-pay | Admitting: Oncology

## 2014-11-12 ENCOUNTER — Other Ambulatory Visit: Payer: Self-pay | Admitting: *Deleted

## 2014-11-12 ENCOUNTER — Other Ambulatory Visit: Payer: Self-pay | Admitting: Oncology

## 2014-11-12 ENCOUNTER — Other Ambulatory Visit (HOSPITAL_BASED_OUTPATIENT_CLINIC_OR_DEPARTMENT_OTHER): Payer: Medicare Other

## 2014-11-12 ENCOUNTER — Ambulatory Visit (HOSPITAL_BASED_OUTPATIENT_CLINIC_OR_DEPARTMENT_OTHER): Payer: Medicare Other

## 2014-11-12 ENCOUNTER — Ambulatory Visit (HOSPITAL_BASED_OUTPATIENT_CLINIC_OR_DEPARTMENT_OTHER): Payer: Medicare Other | Admitting: Oncology

## 2014-11-12 ENCOUNTER — Telehealth: Payer: Self-pay | Admitting: Oncology

## 2014-11-12 VITALS — BP 106/56 | HR 72 | Temp 98.2°F | Resp 18 | Ht 63.0 in | Wt 127.2 lb

## 2014-11-12 DIAGNOSIS — C50912 Malignant neoplasm of unspecified site of left female breast: Secondary | ICD-10-CM

## 2014-11-12 DIAGNOSIS — J910 Malignant pleural effusion: Secondary | ICD-10-CM

## 2014-11-12 DIAGNOSIS — C50811 Malignant neoplasm of overlapping sites of right female breast: Secondary | ICD-10-CM

## 2014-11-12 DIAGNOSIS — C50911 Malignant neoplasm of unspecified site of right female breast: Secondary | ICD-10-CM

## 2014-11-12 DIAGNOSIS — C778 Secondary and unspecified malignant neoplasm of lymph nodes of multiple regions: Secondary | ICD-10-CM

## 2014-11-12 DIAGNOSIS — Z5111 Encounter for antineoplastic chemotherapy: Secondary | ICD-10-CM

## 2014-11-12 DIAGNOSIS — Z720 Tobacco use: Secondary | ICD-10-CM

## 2014-11-12 LAB — CBC WITH DIFFERENTIAL/PLATELET
BASO%: 0.6 % (ref 0.0–2.0)
Basophils Absolute: 0.1 10*3/uL (ref 0.0–0.1)
EOS%: 2.8 % (ref 0.0–7.0)
Eosinophils Absolute: 0.2 10*3/uL (ref 0.0–0.5)
HCT: 39.5 % (ref 34.8–46.6)
HGB: 13.5 g/dL (ref 11.6–15.9)
LYMPH#: 3.7 10*3/uL — AB (ref 0.9–3.3)
LYMPH%: 42.2 % (ref 14.0–49.7)
MCH: 29.9 pg (ref 25.1–34.0)
MCHC: 34.1 g/dL (ref 31.5–36.0)
MCV: 87.9 fL (ref 79.5–101.0)
MONO#: 0.7 10*3/uL (ref 0.1–0.9)
MONO%: 8.2 % (ref 0.0–14.0)
NEUT#: 4 10*3/uL (ref 1.5–6.5)
NEUT%: 46.2 % (ref 38.4–76.8)
Platelets: 213 10*3/uL (ref 145–400)
RBC: 4.5 10*6/uL (ref 3.70–5.45)
RDW: 13.8 % (ref 11.2–14.5)
WBC: 8.7 10*3/uL (ref 3.9–10.3)

## 2014-11-12 LAB — COMPREHENSIVE METABOLIC PANEL (CC13)
ALBUMIN: 3.7 g/dL (ref 3.5–5.0)
ALK PHOS: 79 U/L (ref 40–150)
ALT: 11 U/L (ref 0–55)
ANION GAP: 9 meq/L (ref 3–11)
AST: 13 U/L (ref 5–34)
BILIRUBIN TOTAL: 0.4 mg/dL (ref 0.20–1.20)
BUN: 14.5 mg/dL (ref 7.0–26.0)
CO2: 24 meq/L (ref 22–29)
Calcium: 9.1 mg/dL (ref 8.4–10.4)
Chloride: 102 mEq/L (ref 98–109)
Creatinine: 0.8 mg/dL (ref 0.6–1.1)
EGFR: 63 mL/min/{1.73_m2} — ABNORMAL LOW (ref >90)
GLUCOSE: 84 mg/dL (ref 70–140)
POTASSIUM: 4.2 meq/L (ref 3.5–5.1)
Sodium: 135 mEq/L — ABNORMAL LOW (ref 136–145)
Total Protein: 6.1 g/dL — ABNORMAL LOW (ref 6.4–8.3)

## 2014-11-12 MED ORDER — FULVESTRANT 250 MG/5ML IM SOLN
500.0000 mg | Freq: Once | INTRAMUSCULAR | Status: AC
  Administered 2014-11-12: 500 mg via INTRAMUSCULAR
  Filled 2014-11-12: qty 10

## 2014-11-12 NOTE — Addendum Note (Signed)
Addended by: Neysa Hotter on: 11/12/2014 11:11 AM   Modules accepted: Medications

## 2014-11-12 NOTE — Progress Notes (Signed)
[name]  [phone] [phone]     ID: Brianna Duke OB: 06-25-1927  MR#: [id number]  OHY#:[id number]  PCP: [name], MD GYN:   SU:  OTHER MD: [name], Jeneen Rinks crossley  CHIEF COMPLAINT: Metastatic breast cancer   CURRENT TREATMENT: fulvestrant  BREAST CANCER HISTORY: From [name] intake note dated 11/05/2007:  "She presented to Dr. [name] back in February for a complete physical exam.  She has a history of breast cancer treated back in 1992 by Dr. [name].  She had had a left lumpectomy by Dr. [name] for a reasonably large lesion, although I do not have any of the detail and she also had lymph nodes involved at that time, which I believe, 5 of 15 nodes were involved.  Pathology department no longer has this information.  She did have external beam radiation therapy for this.  We are awaiting that information as well.  For her complete physical, he recommended screening mammogram.  This was performed March 3 and it showed a possible mass noted in the right breast.  Spot compression views and possibly ultrasound were recommended for further evaluation.  Left breast was unremarkable.  She came back for that on March 27 and this showed that the question small mass in the right breast at the 12 o'clock position persisted measuring 4 cm from the nipple, a 0.76 x 0.9 cm hypoechoic mass correlating to the mammographic findings.  They recommended further workup.  On March 30, she had a core biopsy and this showed invasive ductal carcinoma ER/PR positive, HER-2/neu negative.  She went on to have an MRI of the bilateral breasts on April 9 and it showed a 1.1 x 0.8 x 0.5 known invasive ductal carcinoma at the 12 o'clock position of the right breast with an adjacent 3 mm possible satellite lesion immediately adjacent to the posteroinferior aspect of this mass.  There were multiple additional right breast nodules 4 mm or less in maximum  diameter most likely representing normal background nodular parenchyma.  None of these were large enough to warrant a biopsy at the time.  There was no background parenchymal nodularity or enhancement in the left breast possibly due to previous radiation.  There was asymmetrical and mildly prominent right axillary and retropectoral lymph nodes and a second look ultrasound of the right axilla was recommended.  She was given an ultrasound appointment at 11 a.m. on April 10; however, she called in and canceled that appointment saying she does not want to reschedule.  She told me that she had gone up to [place] and was considering not having any kind of treatment at all.  She had multiple small liver masses also noted on MRI, which were too small to accurately characterize on the current images, moderate-sized hiatal hernia.  She then finally returned to town and agreed to have surgery and chest x-ray on May 21, showed slight hyperinflation but no acute process seen.  She went on to have her lumpectomy and sentinel node biopsy by [name] on May 27.  This showed a 1.1 cm invasive ductal carcinoma grade 1 with low-grade DCIS with the DCIS margin being less than 0.1 cm in the lateral superficial resection margins.  Two sentinel nodes were negative.  [name] discussed with her the need for radiation and has sent her here for our opinion regarding the role of radiation.  He also discussed with her hormone receptor status being positive, but the patient declined taking any  sort of medication or even having a medical oncology referral."  The patient did complete radiation to the right breast 12/14/2007, with a boost to a total dose of 6280 cGy . She was subsequently lost to followup.  More recently the patient noted left sided supraclavicular adenopathy. She brought this to the attention of Dr. [name] who obtained a chest CT 07/16/2013 at [facility]. This showed left supraclavicular and anterior mediastinal  adenopathy, a left-sided pleural effusion on with evidence of left-sided pleural disease, a left adrenal mass and probable retroperitoneal adenopathy, several splenic lesions, some right retrocrural adenopathy, nonspecific liver lesions and also evidence of aortic atherosclerosis and emphysema. On 07/21/2013 the patient underwent left thoracentesis, in the pleural fluid showed ([id number]) malignant cells consistent with breast adenocarcinoma, estrogen receptor 35% positive, progesterone receptor 22% positive, with no HER-2 amplification, the signals ratio being 1.40 and the number per cell 1.75.  Her subsequent history is as detailed below.  INTERVAL HISTORY: Brianna Duke returns today for follow up of her metastatic breast cancer. She is back on fulvestrant and tolerates that well.  ROS: Brianna Duke continues to deal with her Bell's palsy on the left face. She has problems at night, when she tries to take the eyes shut. This can wake her up. During the day she uses drops and his staff. She has had some work done on her varicose veins by Dr. Aleda Grana and is very pleased with the results. Aside from these issues she tells me "everything is well". In particular she denies any cough, phlegm production, pleurisy, or worsening shortness of breath. She does continue to smoke.  PAST MEDICAL HISTORY: Past Medical History  Diagnosis Date  . Hyperlipidemia   . Bronchitis     history  . Retinal detachment     history of  . COPD (chronic obstructive pulmonary disease)   . Meniere's disease     surgery  . History of radiation therapy 06/15/00- 08/22/00    left breast, 5940 cGy 33 fractions  . Hx of radiation therapy 11/18/07- 01/02/08    right breast 2880 cGy 16 fractions, right breast 2000 cGy 10 fractions, right breast boost 1400 cGy 7 fractions  . Anxiety   . Varicose veins   . Hypertension   . Thyroid disease     hypothyroid  . Insomnia   . Chronic kidney disease     stage 2  . Breast cancer 05/31/00      left lower outer   . Malignant neoplasm of breast (female), unspecified site 10/02/07    right 12 o'clock , invasive ductal  . Skin cancer     squamous cell- face  . Baker's cyst of knee     right knee  . Metastases to the liver     breast primary  . 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 Metastasis to adrenal gland   . Pleural effusion, malignant 07/21/13    left lung, breast primary  . Cataract     surgery onboth  . Depression     PAST SURGICAL HISTORY: Past Surgical History  Procedure Laterality Date  . Appendectomy      81 yrs old  . Pituitary surgery  2005    adenoma removed  . Thyroidectomy, partial  1991  . Tonsillectomy      age 78    FAMILY HISTORY Family History  Problem Relation Age of Onset  . Cancer Mother   . Cancer Sister     ovarian  . Cancer Sister 66  breast   the patient's father died at the age of 15 in a tractor accident. The patient is one of 9 siblings. Only at 2 siblings survive in addition to her. One is her brother Carmin Muskrat who lives in [place], and sister Alisia Ferrari who lives in [place].Marland KitchenHe can be reached at [phone].   GYNECOLOGIC HISTORY:  Menarche age 36. The patient is GX P0. 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 She moved to [place] from [place] around that time. She is single. She lives alone, with no pets. She attends the good [employer]. In addition to her siblings, listed above, she frequently visits her niece Meryl Dare in [place]. She can be reached at [phone], and she is also close to her friend Renne Musca who lives in [place].  ADVANCED DIRECTIVES: Not in place; on her 09/04/2013 visit the patient was given healthcare power of attorney and living will documents to complete and notarize at her discretion; "the family should  take care of all that" if something happens to her   HEALTH MAINTENANCE: History  Substance Use Topics  . Smoking status: Current Every Day Smoker -- 1.00 packs/day for 30 years    Types: Cigarettes  . Smokeless tobacco: Not on file  . Alcohol Use: Yes     Comment: rarely   No Known Allergies  Current Outpatient Prescriptions  Medication Sig Dispense Refill  . anastrozole (ARIMIDEX) 1 MG tablet Take 1 tablet (1 mg total) by mouth daily. (Patient not taking: Reported on 09/23/2014) 90 tablet 4  . levothyroxine (SYNTHROID, LEVOTHROID) 75 MCG tablet Take 75 mcg by mouth daily before breakfast.    . LORazepam (ATIVAN) 1 MG tablet TAKE 1 TABLET BY MOUTH TWICE A DAY AS NEEDED FOR ANXIETY 30 tablet 1  . Multiple Minerals (CALCIUM/MAGNESIUM/ZINC PO) Take 1 tablet by mouth every morning.    . naproxen sodium (ANAPROX) 220 MG tablet Take 220 mg by mouth 2 (two) times daily as needed.     . Omega-3 Fatty Acids (FISH OIL) 1000 MG CAPS Take 2 capsules by mouth daily.     No current facility-administered medications for this visit.    OBJECTIVE: Older white woman who appears stated age 9 Vitals:   11/12/14 1017  BP: 106/56  Pulse: 72  Temp: 98.2 F (36.8 C)  Resp: 18     Body mass index is 22.54 kg/(m^2).      ECOG FS:1 - Symptomatic but completely ambulatory  Sclerae unicteric, EOMs intact, with inability to close the left eye secondary to left Bell's palsy Oropharynx clear and moist, left cheek droop No cervical or supraclavicular adenopathy Lungs no rales or rhonchi Heart regular rate and rhythm Abd soft, nontender, positive bowel sounds MSK no focal spinal tenderness, no upper extremity lymphedema Neuro: nonfocal, well oriented, appropriate affect Breasts: Status post bilateral lumpectomies. There is no evidence of local recurrence. Both axillae are benign.  LAB RESULTS:  CMP     Component Value Date/Time   NA 140 10/08/2014 1206   NA 138 09/26/2007 1206   K 4.4  10/08/2014 1206   K 3.6 09/26/2007 1206   CL 104 09/26/2007 1206   CO2 26 10/08/2014 1206   CO2 24 09/26/2007 1206   GLUCOSE 95 10/08/2014 1206   GLUCOSE 118* 09/26/2007 1206   BUN 14.7 10/08/2014  1206   BUN 8 09/26/2007 1206   CREATININE 0.9 10/08/2014 1206   CREATININE 0.78 09/26/2007 1206   CALCIUM 9.1 10/08/2014 1206   CALCIUM 9.1 09/26/2007 1206   PROT 6.1* 10/08/2014 1206   ALBUMIN 3.5 10/08/2014 1206   AST 14 10/08/2014 1206   ALT 7 10/08/2014 1206   ALKPHOS 78 10/08/2014 1206   BILITOT 0.29 10/08/2014 1206   GFRNONAA >60 09/26/2007 1206   GFRAA  09/26/2007 1206    >60        The eGFR has been calculated using the MDRD equation. This calculation has not been validated in all clinical   I No results found for: SPEP  Lab Results  Component Value Date   WBC 8.7 11/12/2014   NEUTROABS 4.0 11/12/2014   HGB 13.5 11/12/2014   HCT 39.5 11/12/2014   MCV 87.9 11/12/2014   PLT 213 11/12/2014      Chemistry      Component Value Date/Time   NA 140 10/08/2014 1206   NA 138 09/26/2007 1206   K 4.4 10/08/2014 1206   K 3.6 09/26/2007 1206   CL 104 09/26/2007 1206   CO2 26 10/08/2014 1206   CO2 24 09/26/2007 1206   BUN 14.7 10/08/2014 1206   BUN 8 09/26/2007 1206   CREATININE 0.9 10/08/2014 1206   CREATININE 0.78 09/26/2007 1206      Component Value Date/Time   CALCIUM 9.1 10/08/2014 1206   CALCIUM 9.1 09/26/2007 1206   ALKPHOS 78 10/08/2014 1206   AST 14 10/08/2014 1206   ALT 7 10/08/2014 1206   BILITOT 0.29 10/08/2014 1206       No results found for: LABCA2  No components found for: LABCA125  No results for input(s): INR in the last 168 hours.  Urinalysis No results found for: COLORURINE  STUDIES: Dg Chest 2 View  11/03/2014   CLINICAL DATA:  History of breast cancer, emphysema, metastatic disease. Slight cough.  EXAM: CHEST  2 VIEW  COMPARISON:  02/05/2014  FINDINGS: Hyperinflation. Small to moderate left pleural effusion, similar to prior study.  Left base atelectasis. Right lung is clear. Heart is normal size. Moderate-sized hiatal hernia. No acute bony abnormality.  IMPRESSION: Stable small to moderate left pleural effusion with left base atelectasis, stable.  Moderate hiatal hernia.   Electronically Signed   By: Rolm Baptise M.D.   On: 11/03/2014 15:49    ASSESSMENT: 81 y.o. [place] woman with a history of bilateral breast cancer, now stage IV, as follows  (1) LEFT BREAST:  (a) status post left excisional biopsy 05/14/2000 for a pT2 NX, stage II invasive lobular breast cancer, estrogen receptor 83% positive, progesterone receptor 90% positive, HER-2 negative, with positive margins   (b) left breast reexcision for margin clearance, with left axillary lymph node dissection 05/31/2000, with negative margins but 5/15 axillary lymph nodes involved, final stage pT2 pN2, stage IIIA  (c) status post radiation to the left breast (not to axilla)  (d) on tamoxifen approximately 5 years per patient report  (2) RIGHT BREAST:  (a) status post right lumpectomy and sentinel lymph node sampling 05/272009 for a pT1c pN0, stage IA invasive ductal carcinoma, grade 1, estrogen receptor 99% positive, progesterone receptor and 98% positive, with an MIB-1 of 11%, and no HER-2 amplification.  (b) status post radiation to the right breast completed August 2009  (c) the patient refused antiestrogen therapy and medical oncology evaluation  (3) METASTATIC DISEASE:  (a) staging studies March and April 2015 show extensive  metastatic adenopathy (left supraclavicular and left axillary,. retrocrural, etc.), with splenic involvement, and with significant left pleural involvement and a large left pleural effusion; bone involvement was questionable   (b) left thoracentesis 07/21/2013 confirms a malignant effusion, with adenocarcinoma cells positive for estrogen and progesterone receptor, negative for HER-2 amplification  (c) fulvestrant started 09/04/2013, discontinued  at patient's request after 05/28/2014 dose despite continuing response  (d) anastrozole started 07/01/2014, stopped after 1 month  (d) switched back to fulvestrant starting 10/08/2014  (4) continuing tobacco abuse  PLAN: Brianna Duke is doing well as far as her breast cancer is concerned, with good tolerance of the fulvestrant and good control of the tumor. I gave her a copy of her chest x-ray and she wondered why she has a little bit of fluid around the left lung. We reviewed the fact that this has been present from the beginning, and that we actually pulled out this fluid in March 2015 and found cancer cells there. She understands that removing the little bit of fluid she has they're now would not make her feel better and that it would simply reaccumulate. We have actually gone over all this before and it was a little surprising to me that she still thought the reason for the problem in her lung was an old pneumonia.  She is continuing to smoke, but trying to cut back.  I wish I had a solution for the Bell's palsy. It may be getting better.   As far as her breast cancer is concerned the plan is to continue the fulvestrant until we have evidence of disease progression. She will see [place] in about 3 months and we will repeat a chest x-ray just prior to that visit. She knows to call for any problems that may develop before that.  Chauncey Cruel, MD  11/12/2014 10:29 AM

## 2014-11-12 NOTE — Telephone Encounter (Signed)
Appointments made and avs printed for patient °

## 2014-11-20 DIAGNOSIS — I83811 Varicose veins of right lower extremities with pain: Secondary | ICD-10-CM | POA: Diagnosis not present

## 2014-11-20 DIAGNOSIS — I8311 Varicose veins of right lower extremity with inflammation: Secondary | ICD-10-CM | POA: Diagnosis not present

## 2014-11-25 ENCOUNTER — Other Ambulatory Visit: Payer: Self-pay | Admitting: Oncology

## 2014-11-26 ENCOUNTER — Other Ambulatory Visit: Payer: Self-pay | Admitting: *Deleted

## 2014-11-26 NOTE — Telephone Encounter (Signed)
Faxed ativan prescription to [facility] pharmacy.

## 2014-12-10 ENCOUNTER — Other Ambulatory Visit (HOSPITAL_BASED_OUTPATIENT_CLINIC_OR_DEPARTMENT_OTHER): Payer: Medicare Other

## 2014-12-10 ENCOUNTER — Other Ambulatory Visit: Payer: Self-pay | Admitting: *Deleted

## 2014-12-10 ENCOUNTER — Ambulatory Visit (HOSPITAL_BASED_OUTPATIENT_CLINIC_OR_DEPARTMENT_OTHER): Payer: Medicare Other

## 2014-12-10 VITALS — BP 123/82 | HR 69 | Temp 99.1°F

## 2014-12-10 DIAGNOSIS — C50811 Malignant neoplasm of overlapping sites of right female breast: Secondary | ICD-10-CM

## 2014-12-10 DIAGNOSIS — C778 Secondary and unspecified malignant neoplasm of lymph nodes of multiple regions: Secondary | ICD-10-CM | POA: Diagnosis not present

## 2014-12-10 DIAGNOSIS — J910 Malignant pleural effusion: Secondary | ICD-10-CM

## 2014-12-10 DIAGNOSIS — C50912 Malignant neoplasm of unspecified site of left female breast: Secondary | ICD-10-CM

## 2014-12-10 DIAGNOSIS — Z5111 Encounter for antineoplastic chemotherapy: Secondary | ICD-10-CM

## 2014-12-10 DIAGNOSIS — C50911 Malignant neoplasm of unspecified site of right female breast: Secondary | ICD-10-CM

## 2014-12-10 LAB — CBC WITH DIFFERENTIAL/PLATELET
BASO%: 0.7 % (ref 0.0–2.0)
BASOS ABS: 0.1 10*3/uL (ref 0.0–0.1)
EOS%: 3.4 % (ref 0.0–7.0)
Eosinophils Absolute: 0.3 10*3/uL (ref 0.0–0.5)
HCT: 39.9 % (ref 34.8–46.6)
HGB: 13.6 g/dL (ref 11.6–15.9)
LYMPH#: 2.8 10*3/uL (ref 0.9–3.3)
LYMPH%: 37.8 % (ref 14.0–49.7)
MCH: 29.7 pg (ref 25.1–34.0)
MCHC: 34 g/dL (ref 31.5–36.0)
MCV: 87.4 fL (ref 79.5–101.0)
MONO#: 0.5 10*3/uL (ref 0.1–0.9)
MONO%: 7.1 % (ref 0.0–14.0)
NEUT%: 51 % (ref 38.4–76.8)
NEUTROS ABS: 3.7 10*3/uL (ref 1.5–6.5)
Platelets: 213 10*3/uL (ref 145–400)
RBC: 4.57 10*6/uL (ref 3.70–5.45)
RDW: 13.4 % (ref 11.2–14.5)
WBC: 7.4 10*3/uL (ref 3.9–10.3)

## 2014-12-10 LAB — COMPREHENSIVE METABOLIC PANEL (CC13)
ALT: 12 U/L (ref 0–55)
AST: 13 U/L (ref 5–34)
Albumin: 3.6 g/dL (ref 3.5–5.0)
Alkaline Phosphatase: 83 U/L (ref 40–150)
Anion Gap: 8 mEq/L (ref 3–11)
BUN: 12.6 mg/dL (ref 7.0–26.0)
CO2: 26 mEq/L (ref 22–29)
CREATININE: 0.8 mg/dL (ref 0.6–1.1)
Calcium: 9.2 mg/dL (ref 8.4–10.4)
Chloride: 104 mEq/L (ref 98–109)
EGFR: 67 mL/min/{1.73_m2} — AB (ref >90)
Glucose: 92 mg/dl (ref 70–140)
Potassium: 4.5 mEq/L (ref 3.5–5.1)
Sodium: 139 mEq/L (ref 136–145)
TOTAL PROTEIN: 6.1 g/dL — AB (ref 6.4–8.3)
Total Bilirubin: 0.38 mg/dL (ref 0.20–1.20)

## 2014-12-10 MED ORDER — FULVESTRANT 250 MG/5ML IM SOLN
500.0000 mg | Freq: Once | INTRAMUSCULAR | Status: AC
  Administered 2014-12-10: 500 mg via INTRAMUSCULAR
  Filled 2014-12-10: qty 10

## 2014-12-11 DIAGNOSIS — M7981 Nontraumatic hematoma of soft tissue: Secondary | ICD-10-CM | POA: Diagnosis not present

## 2014-12-11 DIAGNOSIS — M79604 Pain in right leg: Secondary | ICD-10-CM | POA: Diagnosis not present

## 2014-12-21 DIAGNOSIS — G510 Bell's palsy: Secondary | ICD-10-CM | POA: Diagnosis not present

## 2014-12-21 DIAGNOSIS — H16142 Punctate keratitis, left eye: Secondary | ICD-10-CM | POA: Diagnosis not present

## 2015-01-06 ENCOUNTER — Other Ambulatory Visit: Payer: Self-pay | Admitting: *Deleted

## 2015-01-06 DIAGNOSIS — C50911 Malignant neoplasm of unspecified site of right female breast: Secondary | ICD-10-CM

## 2015-01-07 ENCOUNTER — Other Ambulatory Visit (HOSPITAL_BASED_OUTPATIENT_CLINIC_OR_DEPARTMENT_OTHER): Payer: Medicare Other

## 2015-01-07 ENCOUNTER — Ambulatory Visit (HOSPITAL_BASED_OUTPATIENT_CLINIC_OR_DEPARTMENT_OTHER): Payer: Medicare Other

## 2015-01-07 VITALS — BP 127/94 | HR 71 | Temp 98.3°F

## 2015-01-07 DIAGNOSIS — Z5111 Encounter for antineoplastic chemotherapy: Secondary | ICD-10-CM

## 2015-01-07 DIAGNOSIS — J910 Malignant pleural effusion: Secondary | ICD-10-CM

## 2015-01-07 DIAGNOSIS — C50912 Malignant neoplasm of unspecified site of left female breast: Secondary | ICD-10-CM

## 2015-01-07 DIAGNOSIS — C50811 Malignant neoplasm of overlapping sites of right female breast: Secondary | ICD-10-CM

## 2015-01-07 DIAGNOSIS — C50911 Malignant neoplasm of unspecified site of right female breast: Secondary | ICD-10-CM

## 2015-01-07 DIAGNOSIS — C778 Secondary and unspecified malignant neoplasm of lymph nodes of multiple regions: Secondary | ICD-10-CM | POA: Diagnosis not present

## 2015-01-07 LAB — CBC WITH DIFFERENTIAL/PLATELET
BASO%: 0.2 % (ref 0.0–2.0)
BASOS ABS: 0 10*3/uL (ref 0.0–0.1)
EOS ABS: 0.2 10*3/uL (ref 0.0–0.5)
EOS%: 2.3 % (ref 0.0–7.0)
HEMATOCRIT: 37.4 % (ref 34.8–46.6)
HEMOGLOBIN: 12.9 g/dL (ref 11.6–15.9)
LYMPH#: 3.1 10*3/uL (ref 0.9–3.3)
LYMPH%: 38 % (ref 14.0–49.7)
MCH: 29.7 pg (ref 25.1–34.0)
MCHC: 34.5 g/dL (ref 31.5–36.0)
MCV: 86 fL (ref 79.5–101.0)
MONO#: 0.5 10*3/uL (ref 0.1–0.9)
MONO%: 5.9 % (ref 0.0–14.0)
NEUT#: 4.4 10*3/uL (ref 1.5–6.5)
NEUT%: 53.6 % (ref 38.4–76.8)
Platelets: 181 10*3/uL (ref 145–400)
RBC: 4.35 10*6/uL (ref 3.70–5.45)
RDW: 13.6 % (ref 11.2–14.5)
WBC: 8.1 10*3/uL (ref 3.9–10.3)

## 2015-01-07 LAB — COMPREHENSIVE METABOLIC PANEL (CC13)
ALT: 8 U/L (ref 0–55)
AST: 12 U/L (ref 5–34)
Albumin: 3.7 g/dL (ref 3.5–5.0)
Alkaline Phosphatase: 87 U/L (ref 40–150)
Anion Gap: 10 mEq/L (ref 3–11)
BUN: 16.6 mg/dL (ref 7.0–26.0)
CO2: 23 meq/L (ref 22–29)
CREATININE: 1 mg/dL (ref 0.6–1.1)
Calcium: 9.2 mg/dL (ref 8.4–10.4)
Chloride: 106 mEq/L (ref 98–109)
EGFR: 55 mL/min/{1.73_m2} — ABNORMAL LOW (ref >90)
Glucose: 121 mg/dl (ref 70–140)
Potassium: 3.9 mEq/L (ref 3.5–5.1)
Sodium: 139 mEq/L (ref 136–145)
Total Bilirubin: 0.35 mg/dL (ref 0.20–1.20)
Total Protein: 6.2 g/dL — ABNORMAL LOW (ref 6.4–8.3)

## 2015-01-07 MED ORDER — FULVESTRANT 250 MG/5ML IM SOLN
500.0000 mg | Freq: Once | INTRAMUSCULAR | Status: AC
  Administered 2015-01-07: 500 mg via INTRAMUSCULAR
  Filled 2015-01-07: qty 10

## 2015-01-07 NOTE — Patient Instructions (Signed)
Fulvestrant injection  What is this medicine?  FULVESTRANT (ful VES trant) blocks the effects of estrogen. It is used to treat breast cancer in women past the age of menopause.  This medicine may be used for other purposes; ask your health care provider or pharmacist if you have questions.  COMMON BRAND NAME(S): FASLODEX  What should I tell my health care provider before I take this medicine?  They need to know if you have any of these conditions:  -bleeding problems  -liver disease  -low levels of platelets in the blood  -an unusual or allergic reaction to fulvestrant, other medicines, foods, dyes, or preservatives  -pregnant or trying to get pregnant  -breast-feeding  How should I use this medicine?  This medicine is for injection into a muscle. It is usually given by a health care professional in a hospital or clinic setting.  Talk to your pediatrician regarding the use of this medicine in children. Special care may be needed.  Overdosage: If you think you have taken too much of this medicine contact a poison control center or emergency room at once.  NOTE: This medicine is only for you. Do not share this medicine with others.  What if I miss a dose?  It is important not to miss your dose. Call your doctor or health care professional if you are unable to keep an appointment.  What may interact with this medicine?  -medicines that treat or prevent blood clots like warfarin, enoxaparin, and dalteparin  This list may not describe all possible interactions. Give your health care provider a list of all the medicines, herbs, non-prescription drugs, or dietary supplements you use. Also tell them if you smoke, drink alcohol, or use illegal drugs. Some items may interact with your medicine.  What should I watch for while using this medicine?  Your condition will be monitored carefully while you are receiving this medicine. You will need important blood work done while you are taking this medicine.  Do not become pregnant  while taking this medicine. Women should inform their doctor if they wish to become pregnant or think they might be pregnant. There is a potential for serious side effects to an unborn child. Talk to your health care professional or pharmacist for more information.  What side effects may I notice from receiving this medicine?  Side effects that you should report to your doctor or health care professional as soon as possible:  -allergic reactions like skin rash, itching or hives, swelling of the face, lips, or tongue  -feeling faint or lightheaded, falls  -fever or flu-like symptoms  -sore throat  -vaginal bleeding  Side effects that usually do not require medical attention (report to your doctor or health care professional if they continue or are bothersome):  -aches, pains  -constipation or diarrhea  -headache  -hot flashes  -nausea, vomiting  -pain at site where injected  -stomach pain  This list may not describe all possible side effects. Call your doctor for medical advice about side effects. You may report side effects to FDA at 1-800-FDA-1088.  Where should I keep my medicine?  This drug is given in a hospital or clinic and will not be stored at home.  NOTE: This sheet is a summary. It may not cover all possible information. If you have questions about this medicine, talk to your doctor, pharmacist, or health care provider.   2015, Elsevier/Gold Standard. (2007-09-02 15:39:24)

## 2015-01-14 ENCOUNTER — Other Ambulatory Visit: Payer: Self-pay | Admitting: Oncology

## 2015-01-14 DIAGNOSIS — C50911 Malignant neoplasm of unspecified site of right female breast: Secondary | ICD-10-CM

## 2015-01-14 DIAGNOSIS — C50912 Malignant neoplasm of unspecified site of left female breast: Secondary | ICD-10-CM

## 2015-02-03 ENCOUNTER — Ambulatory Visit
Admission: RE | Admit: 2015-02-03 | Discharge: 2015-02-03 | Disposition: A | Discharge status: Home / Self Care | Payer: Medicare Other | Source: Ambulatory Visit | Attending: Internal Medicine | Admitting: Internal Medicine

## 2015-02-03 ENCOUNTER — Other Ambulatory Visit: Payer: Self-pay

## 2015-02-03 ENCOUNTER — Other Ambulatory Visit: Payer: Self-pay | Admitting: Internal Medicine

## 2015-02-03 DIAGNOSIS — F419 Anxiety disorder, unspecified: Secondary | ICD-10-CM | POA: Diagnosis not present

## 2015-02-03 DIAGNOSIS — F17210 Nicotine dependence, cigarettes, uncomplicated: Secondary | ICD-10-CM | POA: Diagnosis not present

## 2015-02-03 DIAGNOSIS — C50912 Malignant neoplasm of unspecified site of left female breast: Secondary | ICD-10-CM

## 2015-02-03 DIAGNOSIS — Z0000 Encounter for general adult medical examination without abnormal findings: Secondary | ICD-10-CM | POA: Diagnosis not present

## 2015-02-03 DIAGNOSIS — E039 Hypothyroidism, unspecified: Secondary | ICD-10-CM | POA: Diagnosis not present

## 2015-02-03 DIAGNOSIS — E782 Mixed hyperlipidemia: Secondary | ICD-10-CM | POA: Diagnosis not present

## 2015-02-03 DIAGNOSIS — J90 Pleural effusion, not elsewhere classified: Secondary | ICD-10-CM | POA: Diagnosis not present

## 2015-02-03 DIAGNOSIS — Z1389 Encounter for screening for other disorder: Secondary | ICD-10-CM | POA: Diagnosis not present

## 2015-02-03 DIAGNOSIS — D352 Benign neoplasm of pituitary gland: Secondary | ICD-10-CM | POA: Diagnosis not present

## 2015-02-03 DIAGNOSIS — C50919 Malignant neoplasm of unspecified site of unspecified female breast: Secondary | ICD-10-CM | POA: Diagnosis not present

## 2015-02-03 DIAGNOSIS — J449 Chronic obstructive pulmonary disease, unspecified: Secondary | ICD-10-CM | POA: Diagnosis not present

## 2015-02-03 DIAGNOSIS — C50911 Malignant neoplasm of unspecified site of right female breast: Secondary | ICD-10-CM

## 2015-02-03 DIAGNOSIS — N182 Chronic kidney disease, stage 2 (mild): Secondary | ICD-10-CM | POA: Diagnosis not present

## 2015-02-04 ENCOUNTER — Ambulatory Visit (HOSPITAL_BASED_OUTPATIENT_CLINIC_OR_DEPARTMENT_OTHER): Payer: Medicare Other

## 2015-02-04 ENCOUNTER — Other Ambulatory Visit (HOSPITAL_BASED_OUTPATIENT_CLINIC_OR_DEPARTMENT_OTHER): Payer: Medicare Other

## 2015-02-04 VITALS — BP 113/96 | HR 70 | Temp 98.3°F

## 2015-02-04 DIAGNOSIS — C778 Secondary and unspecified malignant neoplasm of lymph nodes of multiple regions: Secondary | ICD-10-CM | POA: Diagnosis not present

## 2015-02-04 DIAGNOSIS — Z5111 Encounter for antineoplastic chemotherapy: Secondary | ICD-10-CM

## 2015-02-04 DIAGNOSIS — C50912 Malignant neoplasm of unspecified site of left female breast: Secondary | ICD-10-CM | POA: Diagnosis not present

## 2015-02-04 DIAGNOSIS — C50811 Malignant neoplasm of overlapping sites of right female breast: Secondary | ICD-10-CM

## 2015-02-04 DIAGNOSIS — C50911 Malignant neoplasm of unspecified site of right female breast: Secondary | ICD-10-CM

## 2015-02-04 DIAGNOSIS — J910 Malignant pleural effusion: Secondary | ICD-10-CM

## 2015-02-04 LAB — COMPREHENSIVE METABOLIC PANEL (CC13)
ALBUMIN: 3.7 g/dL (ref 3.5–5.0)
ALK PHOS: 92 U/L (ref 40–150)
ALT: 9 U/L (ref 0–55)
AST: 12 U/L (ref 5–34)
Anion Gap: 7 mEq/L (ref 3–11)
BUN: 12.9 mg/dL (ref 7.0–26.0)
CHLORIDE: 108 meq/L (ref 98–109)
CO2: 25 mEq/L (ref 22–29)
Calcium: 9 mg/dL (ref 8.4–10.4)
Creatinine: 0.6 mg/dL (ref 0.6–1.1)
EGFR: 81 mL/min/{1.73_m2} — ABNORMAL LOW (ref >90)
GLUCOSE: 92 mg/dL (ref 70–140)
POTASSIUM: 4.6 meq/L (ref 3.5–5.1)
SODIUM: 140 meq/L (ref 136–145)
Total Bilirubin: <0.3 mg/dL (ref 0.20–1.20)
Total Protein: 6.5 g/dL (ref 6.4–8.3)

## 2015-02-04 LAB — CBC WITH DIFFERENTIAL/PLATELET
BASO%: 0.3 % (ref 0.0–2.0)
BASOS ABS: 0 10*3/uL (ref 0.0–0.1)
EOS%: 3 % (ref 0.0–7.0)
Eosinophils Absolute: 0.2 10*3/uL (ref 0.0–0.5)
HCT: 39.6 % (ref 34.8–46.6)
HEMOGLOBIN: 13.6 g/dL (ref 11.6–15.9)
LYMPH%: 38 % (ref 14.0–49.7)
MCH: 30.1 pg (ref 25.1–34.0)
MCHC: 34.3 g/dL (ref 31.5–36.0)
MCV: 87.6 fL (ref 79.5–101.0)
MONO#: 0.6 10*3/uL (ref 0.1–0.9)
MONO%: 8 % (ref 0.0–14.0)
NEUT%: 50.7 % (ref 38.4–76.8)
NEUTROS ABS: 3.9 10*3/uL (ref 1.5–6.5)
Platelets: 197 10*3/uL (ref 145–400)
RBC: 4.52 10*6/uL (ref 3.70–5.45)
RDW: 13.8 % (ref 11.2–14.5)
WBC: 7.8 10*3/uL (ref 3.9–10.3)
lymph#: 3 10*3/uL (ref 0.9–3.3)

## 2015-02-04 MED ORDER — FULVESTRANT 250 MG/5ML IM SOLN
500.0000 mg | Freq: Once | INTRAMUSCULAR | Status: AC
  Administered 2015-02-04: 500 mg via INTRAMUSCULAR
  Filled 2015-02-04: qty 10

## 2015-02-04 NOTE — Patient Instructions (Signed)
Fulvestrant injection  What is this medicine?  FULVESTRANT (ful VES trant) blocks the effects of estrogen. It is used to treat breast cancer in women past the age of menopause.  This medicine may be used for other purposes; ask your health care provider or pharmacist if you have questions.  COMMON BRAND NAME(S): FASLODEX  What should I tell my health care provider before I take this medicine?  They need to know if you have any of these conditions:  -bleeding problems  -liver disease  -low levels of platelets in the blood  -an unusual or allergic reaction to fulvestrant, other medicines, foods, dyes, or preservatives  -pregnant or trying to get pregnant  -breast-feeding  How should I use this medicine?  This medicine is for injection into a muscle. It is usually given by a health care professional in a hospital or clinic setting.  Talk to your pediatrician regarding the use of this medicine in children. Special care may be needed.  Overdosage: If you think you have taken too much of this medicine contact a poison control center or emergency room at once.  NOTE: This medicine is only for you. Do not share this medicine with others.  What if I miss a dose?  It is important not to miss your dose. Call your doctor or health care professional if you are unable to keep an appointment.  What may interact with this medicine?  -medicines that treat or prevent blood clots like warfarin, enoxaparin, and dalteparin  This list may not describe all possible interactions. Give your health care provider a list of all the medicines, herbs, non-prescription drugs, or dietary supplements you use. Also tell them if you smoke, drink alcohol, or use illegal drugs. Some items may interact with your medicine.  What should I watch for while using this medicine?  Your condition will be monitored carefully while you are receiving this medicine. You will need important blood work done while you are taking this medicine.  Do not become pregnant  while taking this medicine. Women should inform their doctor if they wish to become pregnant or think they might be pregnant. There is a potential for serious side effects to an unborn child. Talk to your health care professional or pharmacist for more information.  What side effects may I notice from receiving this medicine?  Side effects that you should report to your doctor or health care professional as soon as possible:  -allergic reactions like skin rash, itching or hives, swelling of the face, lips, or tongue  -feeling faint or lightheaded, falls  -fever or flu-like symptoms  -sore throat  -vaginal bleeding  Side effects that usually do not require medical attention (report to your doctor or health care professional if they continue or are bothersome):  -aches, pains  -constipation or diarrhea  -headache  -hot flashes  -nausea, vomiting  -pain at site where injected  -stomach pain  This list may not describe all possible side effects. Call your doctor for medical advice about side effects. You may report side effects to FDA at 1-800-FDA-1088.  Where should I keep my medicine?  This drug is given in a hospital or clinic and will not be stored at home.  NOTE: This sheet is a summary. It may not cover all possible information. If you have questions about this medicine, talk to your doctor, pharmacist, or health care provider.   2015, Elsevier/Gold Standard. (2007-09-02 15:39:24)

## 2015-03-01 ENCOUNTER — Other Ambulatory Visit: Payer: Self-pay | Admitting: Oncology

## 2015-03-01 DIAGNOSIS — E039 Hypothyroidism, unspecified: Secondary | ICD-10-CM | POA: Diagnosis not present

## 2015-03-02 ENCOUNTER — Other Ambulatory Visit: Payer: Self-pay | Admitting: *Deleted

## 2015-03-03 ENCOUNTER — Other Ambulatory Visit: Payer: Self-pay | Admitting: *Deleted

## 2015-03-03 DIAGNOSIS — C50911 Malignant neoplasm of unspecified site of right female breast: Secondary | ICD-10-CM

## 2015-03-04 ENCOUNTER — Ambulatory Visit (HOSPITAL_BASED_OUTPATIENT_CLINIC_OR_DEPARTMENT_OTHER): Payer: Medicare Other | Admitting: Nurse Practitioner

## 2015-03-04 ENCOUNTER — Other Ambulatory Visit (HOSPITAL_BASED_OUTPATIENT_CLINIC_OR_DEPARTMENT_OTHER): Payer: Medicare Other

## 2015-03-04 ENCOUNTER — Ambulatory Visit (HOSPITAL_BASED_OUTPATIENT_CLINIC_OR_DEPARTMENT_OTHER): Payer: Medicare Other

## 2015-03-04 ENCOUNTER — Telehealth: Payer: Self-pay | Admitting: Oncology

## 2015-03-04 ENCOUNTER — Encounter: Payer: Self-pay | Admitting: Nurse Practitioner

## 2015-03-04 VITALS — BP 118/62 | HR 56 | Temp 97.8°F | Resp 18 | Ht 63.0 in | Wt 126.6 lb

## 2015-03-04 DIAGNOSIS — C50811 Malignant neoplasm of overlapping sites of right female breast: Secondary | ICD-10-CM

## 2015-03-04 DIAGNOSIS — J910 Malignant pleural effusion: Secondary | ICD-10-CM

## 2015-03-04 DIAGNOSIS — Z720 Tobacco use: Secondary | ICD-10-CM | POA: Diagnosis not present

## 2015-03-04 DIAGNOSIS — C50911 Malignant neoplasm of unspecified site of right female breast: Secondary | ICD-10-CM

## 2015-03-04 DIAGNOSIS — Z5111 Encounter for antineoplastic chemotherapy: Secondary | ICD-10-CM

## 2015-03-04 DIAGNOSIS — J90 Pleural effusion, not elsewhere classified: Secondary | ICD-10-CM

## 2015-03-04 DIAGNOSIS — C778 Secondary and unspecified malignant neoplasm of lymph nodes of multiple regions: Secondary | ICD-10-CM | POA: Diagnosis not present

## 2015-03-04 DIAGNOSIS — C50912 Malignant neoplasm of unspecified site of left female breast: Secondary | ICD-10-CM

## 2015-03-04 LAB — CBC WITH DIFFERENTIAL/PLATELET
BASO%: 0.3 % (ref 0.0–2.0)
Basophils Absolute: 0 10*3/uL (ref 0.0–0.1)
EOS ABS: 0.2 10*3/uL (ref 0.0–0.5)
EOS%: 2.9 % (ref 0.0–7.0)
HCT: 42.1 % (ref 34.8–46.6)
HEMOGLOBIN: 14.4 g/dL (ref 11.6–15.9)
LYMPH%: 42.5 % (ref 14.0–49.7)
MCH: 29.6 pg (ref 25.1–34.0)
MCHC: 34.2 g/dL (ref 31.5–36.0)
MCV: 86.6 fL (ref 79.5–101.0)
MONO#: 0.5 10*3/uL (ref 0.1–0.9)
MONO%: 6.3 % (ref 0.0–14.0)
NEUT%: 48 % (ref 38.4–76.8)
NEUTROS ABS: 3.8 10*3/uL (ref 1.5–6.5)
Platelets: 183 10*3/uL (ref 145–400)
RBC: 4.86 10*6/uL (ref 3.70–5.45)
RDW: 13.8 % (ref 11.2–14.5)
WBC: 8 10*3/uL (ref 3.9–10.3)
lymph#: 3.4 10*3/uL — ABNORMAL HIGH (ref 0.9–3.3)

## 2015-03-04 LAB — COMPREHENSIVE METABOLIC PANEL (CC13)
ALBUMIN: 3.8 g/dL (ref 3.5–5.0)
AST: 13 U/L (ref 5–34)
Alkaline Phosphatase: 90 U/L (ref 40–150)
Anion Gap: 9 mEq/L (ref 3–11)
BILIRUBIN TOTAL: 0.42 mg/dL (ref 0.20–1.20)
BUN: 12.3 mg/dL (ref 7.0–26.0)
CO2: 25 meq/L (ref 22–29)
CREATININE: 0.9 mg/dL (ref 0.6–1.1)
Calcium: 9.5 mg/dL (ref 8.4–10.4)
Chloride: 104 mEq/L (ref 98–109)
EGFR: 62 mL/min/{1.73_m2} — AB (ref >90)
GLUCOSE: 108 mg/dL (ref 70–140)
Potassium: 4.1 mEq/L (ref 3.5–5.1)
SODIUM: 138 meq/L (ref 136–145)
TOTAL PROTEIN: 6.5 g/dL (ref 6.4–8.3)

## 2015-03-04 MED ORDER — FULVESTRANT 250 MG/5ML IM SOLN
500.0000 mg | Freq: Once | INTRAMUSCULAR | Status: AC
  Administered 2015-03-04: 500 mg via INTRAMUSCULAR
  Filled 2015-03-04: qty 10

## 2015-03-04 NOTE — Telephone Encounter (Signed)
Gave patient avs report and appointments for November 2016 thru January 2017. Patient also given radiology requisition request to have cxr done 05/27/15 @ 11 am at [facility] prior to seeing GM at [facility] @ 12 pm.

## 2015-03-04 NOTE — Progress Notes (Signed)
[place]  Telephone:([phone] Fax:(336) [phone]     ID: Kerrin Mo Shaddix OB: 1928/04/03  MR#: [id number]  PFX#:[id number]  PCP: [name], MD GYN:   SU:  OTHER MD: [name], Jeneen Rinks crossley  CHIEF COMPLAINT: Metastatic breast cancer   CURRENT TREATMENT: fulvestrant  BREAST CANCER HISTORY: From [name] intake note dated 11/05/2007:  "She presented to Dr. [name] back in February for a complete physical exam.  She has a history of breast cancer treated back in 1992 by Dr. [name].  She had had a left lumpectomy by Dr. [name] for a reasonably large lesion, although I do not have any of the detail and she also had lymph nodes involved at that time, which I believe, 5 of 15 nodes were involved.  Pathology department no longer has this information.  She did have external beam radiation therapy for this.  We are awaiting that information as well.  For her complete physical, he recommended screening mammogram.  This was performed March 3 and it showed a possible mass noted in the right breast.  Spot compression views and possibly ultrasound were recommended for further evaluation.  Left breast was unremarkable.  She came back for that on March 27 and this showed that the question small mass in the right breast at the 12 o'clock position persisted measuring 4 cm from the nipple, a 0.76 x 0.9 cm hypoechoic mass correlating to the mammographic findings.  They recommended further workup.  On March 30, she had a core biopsy and this showed invasive ductal carcinoma ER/PR positive, HER-2/neu negative.  She went on to have an MRI of the bilateral breasts on April 9 and it showed a 1.1 x 0.8 x 0.5 known invasive ductal carcinoma at the 12 o'clock position of the right breast with an adjacent 3 mm possible satellite lesion immediately adjacent to the posteroinferior aspect of this mass.  There were multiple additional right breast nodules 4 mm or less in maximum  diameter most likely representing normal background nodular parenchyma.  None of these were large enough to warrant a biopsy at the time.  There was no background parenchymal nodularity or enhancement in the left breast possibly due to previous radiation.  There was asymmetrical and mildly prominent right axillary and retropectoral lymph nodes and a second look ultrasound of the right axilla was recommended.  She was given an ultrasound appointment at 11 a.m. on April 10; however, she called in and canceled that appointment saying she does not want to reschedule.  She told me that she had gone up to [place] and was considering not having any kind of treatment at all.  She had multiple small liver masses also noted on MRI, which were too small to accurately characterize on the current images, moderate-sized hiatal hernia.  She then finally returned to town and agreed to have surgery and chest x-ray on May 21, showed slight hyperinflation but no acute process seen.  She went on to have her lumpectomy and sentinel node biopsy by [name] on May 27.  This showed a 1.1 cm invasive ductal carcinoma grade 1 with low-grade DCIS with the DCIS margin being less than 0.1 cm in the lateral superficial resection margins.  Two sentinel nodes were negative.  [name] discussed with her the need for radiation and has sent her here for our opinion regarding the role of radiation.  He also discussed with her hormone receptor status being positive, but the patient declined taking any  sort of medication or even having a medical oncology referral."  The patient did complete radiation to the right breast 12/14/2007, with a boost to a total dose of 6280 cGy . She was subsequently lost to followup.  More recently the patient noted left sided supraclavicular adenopathy. She brought this to the attention of Dr. [name] who obtained a chest CT 07/16/2013 at [facility]. This showed left supraclavicular and anterior mediastinal  adenopathy, a left-sided pleural effusion on with evidence of left-sided pleural disease, a left adrenal mass and probable retroperitoneal adenopathy, several splenic lesions, some right retrocrural adenopathy, nonspecific liver lesions and also evidence of aortic atherosclerosis and emphysema. On 07/21/2013 the patient underwent left thoracentesis, in the pleural fluid showed ([id number]) malignant cells consistent with breast adenocarcinoma, estrogen receptor 35% positive, progesterone receptor 22% positive, with no HER-2 amplification, the signals ratio being 1.40 and the number per cell 1.75.  Her subsequent history is as detailed below.  INTERVAL HISTORY: Grainne returns today for follow up of her metastatic breast cancer. She continues on monthly fulvestrant injections and tolerates these well with no side effects that she is aware of. The interval history is generally unremarkable.  ROS: Verenise denies fevers, chills, nausea, vomiting, or changes in bowel or bladder habits. She has no pain. She is still battling Bell's palsy which affects the left side of her face. She had some acupuncture treatments done and now she is able to close her left eye. She continues to smoke 1 full ppd, but she has been working out faithfully at [employer] a few times a week, lifting weights and walking on the treadmill. She increased shortness of breath, chest pain, cough, or palpitations. She has no headaches, dizziness, or weakness. A detailed review of systems is otherwise stable.  PAST MEDICAL HISTORY: Past Medical History  Diagnosis Date  . Hyperlipidemia   . Bronchitis     history  . Retinal detachment     history of  . COPD (chronic obstructive pulmonary disease)   . Meniere's disease     surgery  . History of radiation therapy 06/15/00- 08/22/00    left breast, 5940 cGy 33 fractions  . Hx of radiation therapy 11/18/07- 01/02/08    right breast 2880 cGy 16 fractions, right breast 2000 cGy 10 fractions, right  breast boost 1400 cGy 7 fractions  . Anxiety   . Varicose veins   . Hypertension   . Thyroid disease     hypothyroid  . Insomnia   . Chronic kidney disease     stage 2  . Breast cancer 05/31/00     left lower outer   . Malignant neoplasm of breast (female), unspecified site 10/02/07    right 12 o'clock , invasive ductal  . Skin cancer     squamous cell- face  . Baker's cyst of knee     right knee  . Metastases to the liver     breast primary  . 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 Metastasis to adrenal gland   . Pleural effusion, malignant 07/21/13    left lung, breast primary  . Cataract     surgery onboth  . Depression     PAST SURGICAL HISTORY: Past Surgical History  Procedure Laterality Date  . Appendectomy      81 yrs old  . Pituitary surgery  2005    adenoma removed  . Thyroidectomy, partial  1991  . Tonsillectomy      age 69    FAMILY  HISTORY Family History  Problem Relation Age of Onset  . Cancer Mother   . Cancer Sister     ovarian  . Cancer Sister 60    breast   the patient's father died at the age of 19 in a tractor accident. The patient is one of 9 siblings. Only at 2 siblings survive in addition to her. One is her brother Carmin Muskrat who lives in [place], and sister Alisia Ferrari who lives in [place].Marland KitchenHe can be reached at [phone].   GYNECOLOGIC HISTORY:  Menarche age 87. The patient is GX P0. 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 She moved to [place] from [place] around that time. She is single. She lives alone, with no pets. She attends the good [employer]. In addition to her siblings, listed above, she frequently visits her niece Meryl Dare in [place]. She can be reached at [phone], and she is also close to her friend Renne Musca who lives in  [place].  ADVANCED DIRECTIVES: Not in place; on her 09/04/2013 visit the patient was given healthcare power of attorney and living will documents to complete and notarize at her discretion; "the family should take care of all that" if something happens to her   HEALTH MAINTENANCE: Social History  Substance Use Topics  . Smoking status: Current Every Day Smoker -- 1.00 packs/day for 30 years    Types: Cigarettes  . Smokeless tobacco: Not on file  . Alcohol Use: Yes     Comment: rarely   No Known Allergies  Current Outpatient Prescriptions  Medication Sig Dispense Refill  . levothyroxine (SYNTHROID, LEVOTHROID) 75 MCG tablet Take 75 mcg by mouth daily before breakfast.    . LORazepam (ATIVAN) 1 MG tablet TAKE 1 TABLET BY MOUTH TWICE A DY AS NEEDED FOR ANXIETY 30 tablet 1  . Multiple Minerals (CALCIUM/MAGNESIUM/ZINC PO) Take 1 tablet by mouth every morning.    . naproxen sodium (ANAPROX) 220 MG tablet Take 220 mg by mouth 2 (two) times daily as needed.     . mupirocin cream (BACTROBAN) 2 % APPLY TO AFFECTED AREA TWICE A DAY AS DIRECTED  0   No current facility-administered medications for this visit.    OBJECTIVE: Older white woman who appears stated age 63 Vitals:   03/04/15 1345  BP: 118/62  Pulse:   Temp:   Resp:      Body mass index is 22.43 kg/(m^2).      ECOG FS:1 - Symptomatic but completely ambulatory  Skin: warm, dry  HEENT: sclerae anicteric, conjunctivae pink, oropharynx clear. No thrush or mucositis. Left side of face "frozen" secondary to bell's palsy. Able to blink left eye. Lymph Nodes: No cervical or supraclavicular lymphadenopathy  Lungs: clear to auscultation bilaterally, no rales, wheezes, or rhonci  Heart: regular rate and rhythm  Abdomen: round, soft, non tender, positive bowel sounds  Musculoskeletal: No focal spinal tenderness, no peripheral edema  Neuro: non focal, well oriented, positive affect  Breasts:bilateral breasts status post lumpectomy  and radiation. No evidence of recurrent disease. Bilateral axillae benign.   LAB RESULTS:  CMP     Component Value Date/Time   NA 138 03/04/2015 1233   NA 138 09/26/2007 1206   K 4.1 03/04/2015 1233   K 3.6 09/26/2007 1206   CL 104 09/26/2007 1206   CO2 25  03/04/2015 1233   CO2 24 09/26/2007 1206   GLUCOSE 108 03/04/2015 1233   GLUCOSE 118* 09/26/2007 1206   BUN 12.3 03/04/2015 1233   BUN 8 09/26/2007 1206   CREATININE 0.9 03/04/2015 1233   CREATININE 0.78 09/26/2007 1206   CALCIUM 9.5 03/04/2015 1233   CALCIUM 9.1 09/26/2007 1206   PROT 6.5 03/04/2015 1233   ALBUMIN 3.8 03/04/2015 1233   AST 13 03/04/2015 1233   ALT <9 Repeated and Verified 03/04/2015 1233   ALKPHOS 90 03/04/2015 1233   BILITOT 0.42 03/04/2015 1233   GFRNONAA >60 09/26/2007 1206   GFRAA  09/26/2007 1206    >60        The eGFR has been calculated using the MDRD equation. This calculation has not been validated in all clinical   I No results found for: SPEP  Lab Results  Component Value Date   WBC 8.0 03/04/2015   NEUTROABS 3.8 03/04/2015   HGB 14.4 03/04/2015   HCT 42.1 03/04/2015   MCV 86.6 03/04/2015   PLT 183 03/04/2015      Chemistry      Component Value Date/Time   NA 138 03/04/2015 1233   NA 138 09/26/2007 1206   K 4.1 03/04/2015 1233   K 3.6 09/26/2007 1206   CL 104 09/26/2007 1206   CO2 25 03/04/2015 1233   CO2 24 09/26/2007 1206   BUN 12.3 03/04/2015 1233   BUN 8 09/26/2007 1206   CREATININE 0.9 03/04/2015 1233   CREATININE 0.78 09/26/2007 1206      Component Value Date/Time   CALCIUM 9.5 03/04/2015 1233   CALCIUM 9.1 09/26/2007 1206   ALKPHOS 90 03/04/2015 1233   AST 13 03/04/2015 1233   ALT <9 Repeated and Verified 03/04/2015 1233   BILITOT 0.42 03/04/2015 1233       No results found for: LABCA2  No components found for: LABCA125  No results for input(s): INR in the last 168 hours.  Urinalysis No results found for: COLORURINE  STUDIES: Dg Chest 2  View  02/03/2015  CLINICAL DATA:  COPD, left pleural effusion EXAM: CHEST  2 VIEW COMPARISON:  11/03/2014 FINDINGS: There is a small left pleural effusion. There is no right pleural effusion. There is no pneumothorax. There is no focal consolidation. Stable cardiomediastinal silhouette. No acute osseous abnormality. IMPRESSION: Stable small left pleural effusion. Electronically Signed   By: Kathreen Devoid   On: 02/03/2015 15:36    ASSESSMENT: 81 y.o. [place] woman with a history of bilateral breast cancer, now stage IV, as follows  (1) LEFT BREAST:  (a) status post left excisional biopsy 05/14/2000 for a pT2 NX, stage II invasive lobular breast cancer, estrogen receptor 83% positive, progesterone receptor 90% positive, HER-2 negative, with positive margins   (b) left breast reexcision for margin clearance, with left axillary lymph node dissection 05/31/2000, with negative margins but 5/15 axillary lymph nodes involved, final stage pT2 pN2, stage IIIA  (c) status post radiation to the left breast (not to axilla)  (d) on tamoxifen approximately 5 years per patient report  (2) RIGHT BREAST:  (a) status post right lumpectomy and sentinel lymph node sampling 05/272009 for a pT1c pN0, stage IA invasive ductal carcinoma, grade 1, estrogen receptor 99% positive, progesterone receptor and 98% positive, with an MIB-1 of 11%, and no HER-2 amplification.  (b) status post radiation to the right breast completed August 2009  (c) the patient refused antiestrogen therapy and medical oncology evaluation  (3) METASTATIC DISEASE:  (a) staging  studies March and April 2015 show extensive metastatic adenopathy (left supraclavicular and left axillary,. retrocrural, etc.), with splenic involvement, and with significant left pleural involvement and a large left pleural effusion; bone involvement was questionable   (b) left thoracentesis 07/21/2013 confirms a malignant effusion, with adenocarcinoma cells positive for  estrogen and progesterone receptor, negative for HER-2 amplification  (c) fulvestrant started 09/04/2013, discontinued at patient's request after 05/28/2014 dose despite continuing response  (d) anastrozole started 07/01/2014, stopped after 1 month  (d) switched back to fulvestrant starting 10/08/2014  (4) continuing tobacco abuse  PLAN: Yashira is doing well as far as her metastatic breast cancer is concerned. Her most recent chest xray showed a stable small left pleural effusion. Again, she inquired why we wont simply drain the fluid. But I explained to her that it would likely just reaccummulate, and it is causing her no acute symptoms as of now. There are cancer cells there, but Tyresa denies this and says that she has no breast cancer, and that all of this is the result of a bout of pneumonia last year. I attempted to explain that the nodules and pleural effusions are simply manifestations of her breast cancer, though there is nothing found in the breasts themselves. This was too much for her to comprehend at this time, but she did understand the importance of her fulvestrant injections and acknowledges that it is doing a good job of controlling the issue, no matter what she considers the originating cause.   I tried to convince her to cut back on her smoking, if just 2 cigarettes a day, and she is not ready to do this.   Akiah will continue monthly fulvestrant injections until there is evidence of disease progression. She will return in 3 months for follow up and will have a repeat chest xray prior to [facility] visit. She understands and agrees with this plan. She knows the goal of treatment in her case is control. She has been encouraged to call with any issues that might arise before her next visit here.  Laurie Panda, NP  03/04/2015 1:48 PM

## 2015-04-02 ENCOUNTER — Ambulatory Visit: Payer: Medicare Other

## 2015-04-25 NOTE — Progress Notes (Signed)
This encounter was created in error - please disregard.

## 2015-04-29 ENCOUNTER — Ambulatory Visit: Payer: Medicare Other

## 2015-04-29 ENCOUNTER — Telehealth: Payer: Self-pay | Admitting: *Deleted

## 2015-04-29 NOTE — Telephone Encounter (Signed)
Called Brianna Duke about missed injection appointments.  She states that she had been moving to [place].  She is not got any plans to get a MD where she has moved to.  She stated that she is going to take a break from the injections.  When asked if she was going to come for the January,2017 appointment, she asked if it could be moved to early in January.  i told her that she would have to call scheduler to change it.  I am not sure she understands what the injection is for or how important it is for her to get the injection.   Told her that this nurse would notify the doctor.

## 2015-05-10 ENCOUNTER — Other Ambulatory Visit: Payer: Self-pay | Admitting: Oncology

## 2015-05-10 ENCOUNTER — Encounter: Payer: Self-pay | Admitting: Oncology

## 2015-05-12 DIAGNOSIS — G510 Bell's palsy: Secondary | ICD-10-CM | POA: Diagnosis not present

## 2015-05-27 ENCOUNTER — Telehealth: Payer: Self-pay

## 2015-05-27 ENCOUNTER — Ambulatory Visit: Payer: Medicare Other | Admitting: Oncology

## 2015-05-27 ENCOUNTER — Ambulatory Visit: Payer: Medicare Other

## 2015-05-27 NOTE — Telephone Encounter (Signed)
Thayer Headings, RN from injection room called stating that patient now has missed two injection appts for faslodex.  Writer noted that patient also missed her appt and lab with Dr. Jana Hakim.  Writer tried to call patient and LVM letting her know about missed appts and encouraged her to call back so it could be discussed.

## 2015-05-28 ENCOUNTER — Encounter: Payer: Self-pay | Admitting: Oncology

## 2015-06-14 ENCOUNTER — Other Ambulatory Visit: Payer: Self-pay | Admitting: Internal Medicine

## 2015-06-14 ENCOUNTER — Ambulatory Visit
Admission: RE | Admit: 2015-06-14 | Discharge: 2015-06-14 | Disposition: A | Discharge status: Home / Self Care | Payer: Medicare Other | Source: Ambulatory Visit | Attending: Internal Medicine | Admitting: Internal Medicine

## 2015-06-14 DIAGNOSIS — C50919 Malignant neoplasm of unspecified site of unspecified female breast: Secondary | ICD-10-CM | POA: Diagnosis not present

## 2015-06-14 DIAGNOSIS — R059 Cough, unspecified: Secondary | ICD-10-CM

## 2015-06-14 DIAGNOSIS — R05 Cough: Secondary | ICD-10-CM

## 2015-06-14 DIAGNOSIS — F17210 Nicotine dependence, cigarettes, uncomplicated: Secondary | ICD-10-CM | POA: Diagnosis not present

## 2015-06-14 DIAGNOSIS — E039 Hypothyroidism, unspecified: Secondary | ICD-10-CM | POA: Diagnosis not present

## 2015-06-14 DIAGNOSIS — J449 Chronic obstructive pulmonary disease, unspecified: Secondary | ICD-10-CM | POA: Diagnosis not present

## 2015-06-14 DIAGNOSIS — D352 Benign neoplasm of pituitary gland: Secondary | ICD-10-CM | POA: Diagnosis not present

## 2015-07-01 DIAGNOSIS — J0110 Acute frontal sinusitis, unspecified: Secondary | ICD-10-CM | POA: Diagnosis not present

## 2015-07-01 DIAGNOSIS — R062 Wheezing: Secondary | ICD-10-CM | POA: Diagnosis not present

## 2015-07-01 DIAGNOSIS — J209 Acute bronchitis, unspecified: Secondary | ICD-10-CM | POA: Diagnosis not present

## 2015-07-09 DIAGNOSIS — R0981 Nasal congestion: Secondary | ICD-10-CM | POA: Diagnosis not present

## 2015-07-09 DIAGNOSIS — R531 Weakness: Secondary | ICD-10-CM | POA: Diagnosis not present

## 2015-07-09 DIAGNOSIS — Z79899 Other long term (current) drug therapy: Secondary | ICD-10-CM | POA: Diagnosis not present

## 2015-07-09 DIAGNOSIS — R1033 Periumbilical pain: Secondary | ICD-10-CM | POA: Diagnosis not present

## 2015-07-09 DIAGNOSIS — R509 Fever, unspecified: Secondary | ICD-10-CM | POA: Diagnosis not present

## 2015-07-09 DIAGNOSIS — R05 Cough: Secondary | ICD-10-CM | POA: Diagnosis not present

## 2015-07-09 DIAGNOSIS — K5900 Constipation, unspecified: Secondary | ICD-10-CM | POA: Diagnosis not present

## 2015-07-09 DIAGNOSIS — Z85118 Personal history of other malignant neoplasm of bronchus and lung: Secondary | ICD-10-CM | POA: Diagnosis not present

## 2015-07-09 DIAGNOSIS — Z853 Personal history of malignant neoplasm of breast: Secondary | ICD-10-CM | POA: Diagnosis not present

## 2015-07-09 DIAGNOSIS — J111 Influenza due to unidentified influenza virus with other respiratory manifestations: Secondary | ICD-10-CM | POA: Diagnosis not present

## 2015-07-09 DIAGNOSIS — K429 Umbilical hernia without obstruction or gangrene: Secondary | ICD-10-CM | POA: Diagnosis not present

## 2015-07-09 DIAGNOSIS — J90 Pleural effusion, not elsewhere classified: Secondary | ICD-10-CM | POA: Diagnosis not present

## 2015-09-02 DIAGNOSIS — F17200 Nicotine dependence, unspecified, uncomplicated: Secondary | ICD-10-CM | POA: Diagnosis not present

## 2015-09-02 DIAGNOSIS — J209 Acute bronchitis, unspecified: Secondary | ICD-10-CM | POA: Diagnosis not present

## 2015-10-29 IMAGING — PT NM PET TUM IMG RESTAG (PS) SKULL BASE T - THIGH
8 series · 25 of 25 positions shown · non-contrast
Comparison: None available

CLINICAL DATA: Subsequent treatment strategy for breast carcinoma.
Malignant pleural effusion..

EXAM:
NUCLEAR MEDICINE PET SKULL BASE TO THIGH
TECHNIQUE: 7.7 mCi F-18 FDG was injected intravenously. Full-ring PET imaging
was performed from the skull base to thigh after the radiotracer. CT
data was obtained and used for attenuation correction and anatomic
localization.
FASTING BLOOD GLUCOSE:  Value: 8 5 mg/dl

[Series 3: pet sk_thigh ac · axial · 5.0mm · 4.07mm/px · z∈[-1329,-529]mm · 4 of 201 slices shown]
[im 1/201]
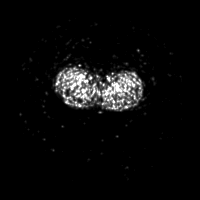
[im 67/201]
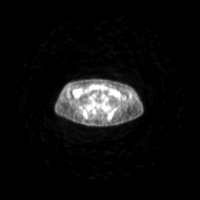
[im 134/201]
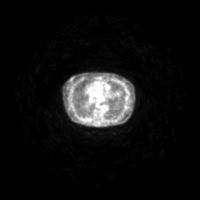
[im 201/201]
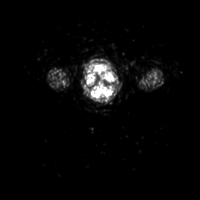

[Series 4: ct sk_thigh 5.0 b31f · axial · 5.0mm · 0.98mm/px · z∈[-1329,-529]mm · 5 of 201 slices shown]
[im 1/201]
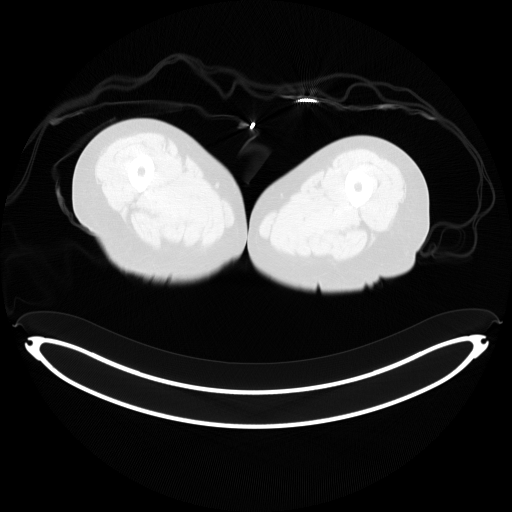
[im 51/201]
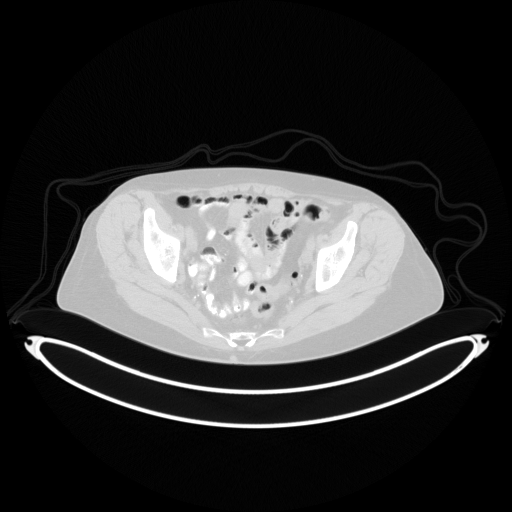
[im 101/201]
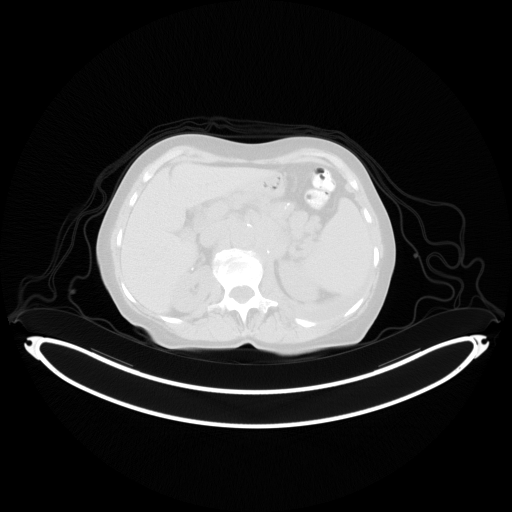
[im 151/201]
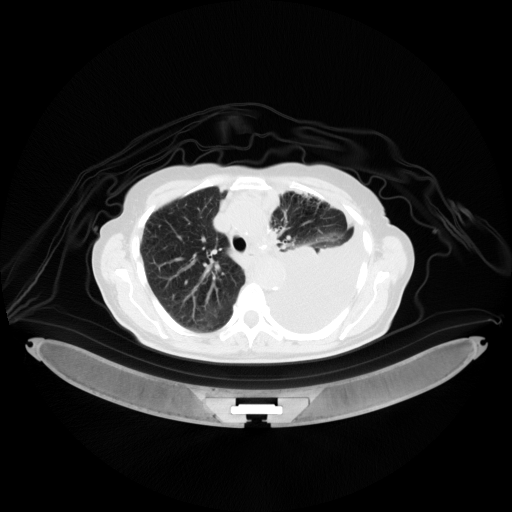
[im 201/201]
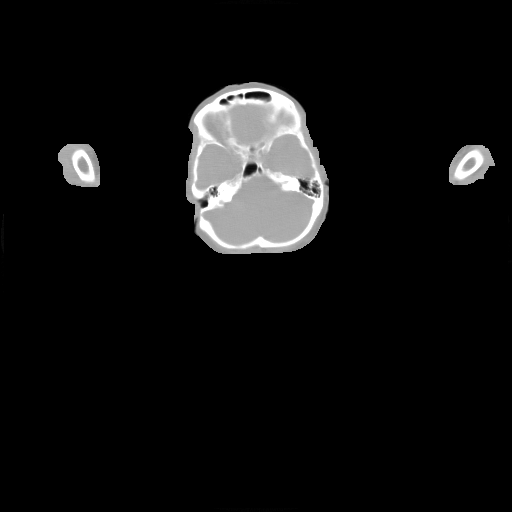

[Series 7: pet sk_thigh nac · axial · 5.0mm · 4.07mm/px · z∈[-1329,-529]mm · 5 of 201 slices shown]
[im 1/201]
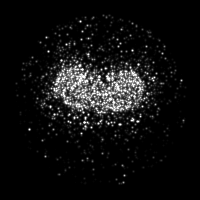
[im 51/201]
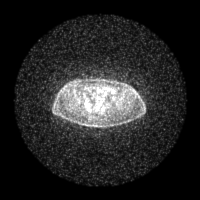
[im 101/201]
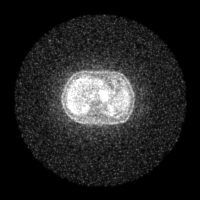
[im 151/201]
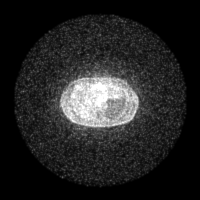
[im 201/201]
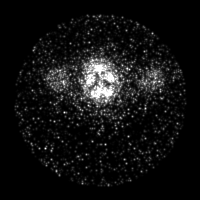

[Series 8: ct sk_thigh 5.0 b70f (id)_bone · axial · 5.0mm · 0.52mm/px · z∈[-892,-632]mm · 2 of 66 slices shown]
[im 1/66  bone]
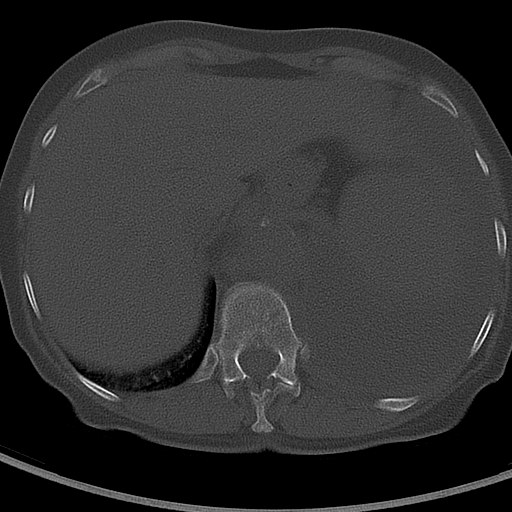
[im 66/66  bone]
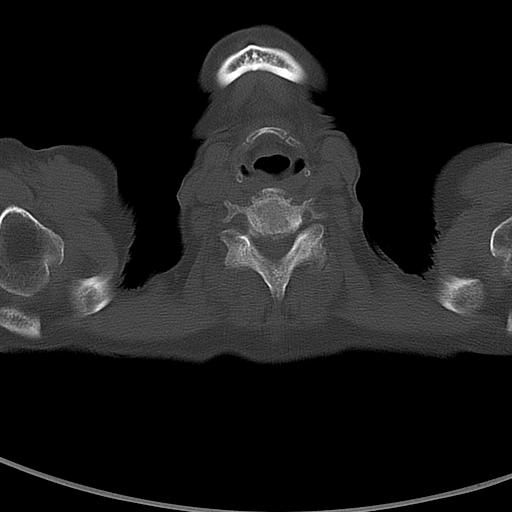

[Series 603: mip collection<mip range> · coronal · 1.68mm/px · 1 of 32 slices shown]
[im 1/32]
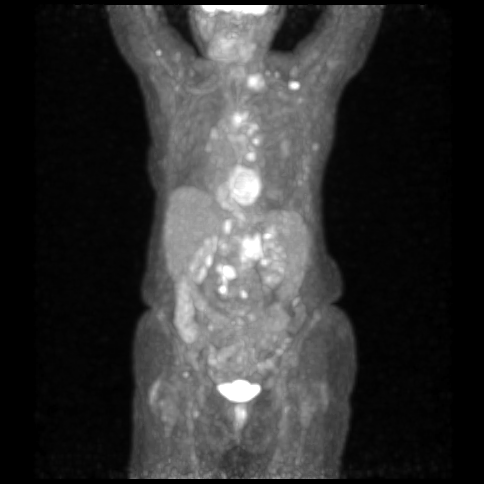

[Series 604: range-ct sk_thigh 5.0 (id)<alpha range> · 2 of 74 slices shown (1 of 2)]
[im 1/74]
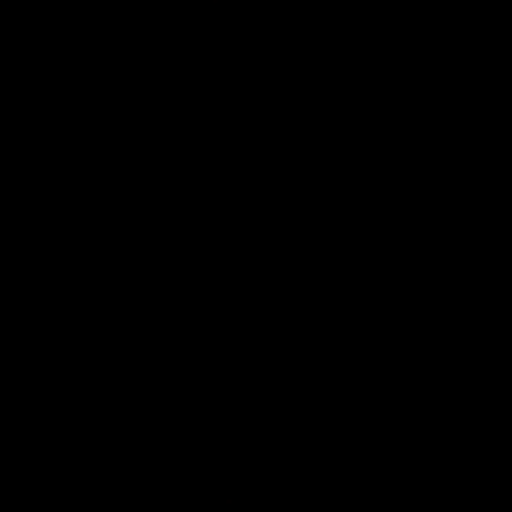
[im 74/74]
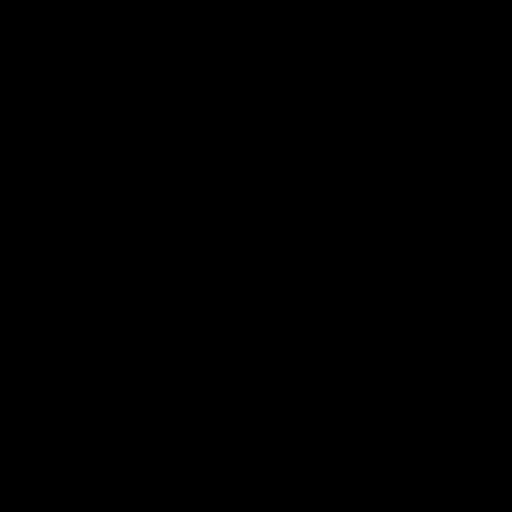

[Series 605: range-ct sk_thigh 5.0 (id)<alpha range> · 5 of 185 slices shown (2 of 2)]
[im 1/185]
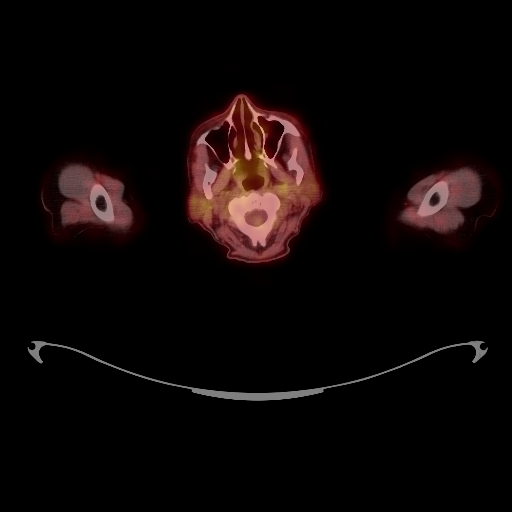
[im 47/185]
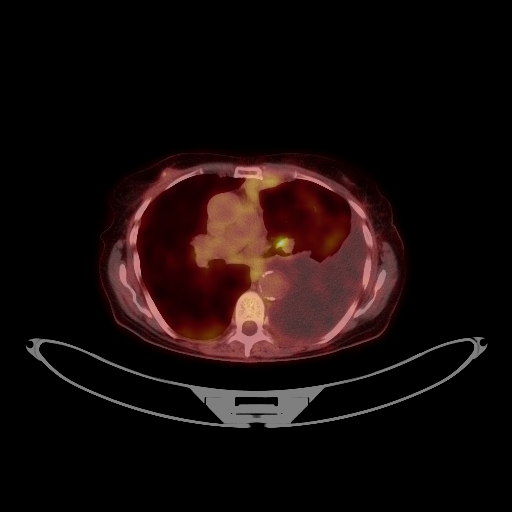
[im 93/185]
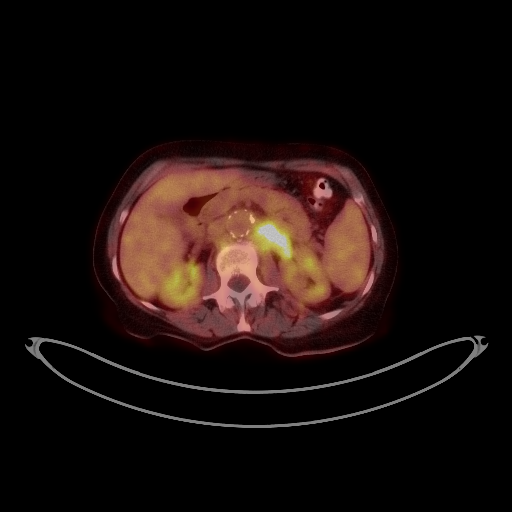
[im 139/185]
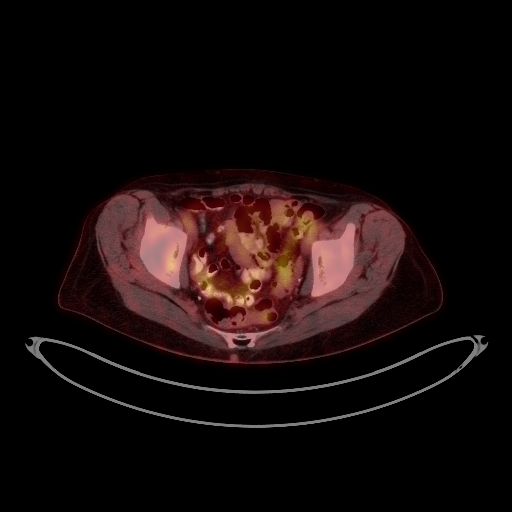
[im 185/185]
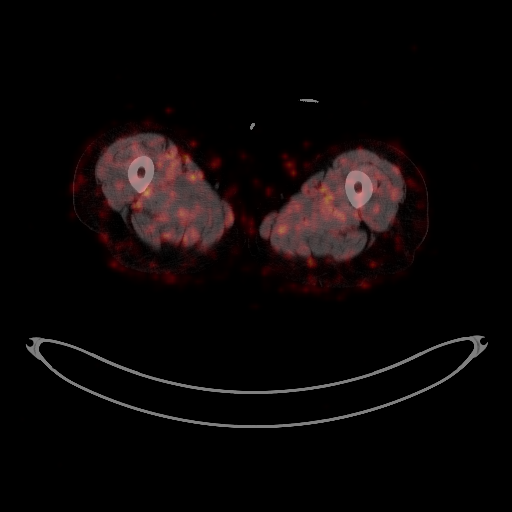

[Series 1032: results mm oncology reading · 0.89mm/px · 1 of 10 slices shown]
[im 1/10]
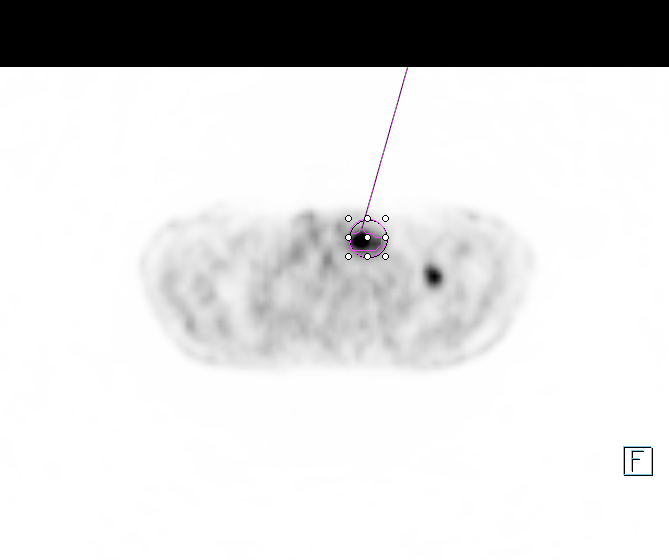

[25 of 25 positions shown; findings below may reference images not displayed]

FINDINGS: NECK

Hypermetabolic left supraclavicular lymph node measures 17 mm short
axis with SUV max equals 6.5. Hypermetabolic high left axillary
lymph node measures 16 mm short axis (image 23) of fused series)
with SUV max equal 8.8. There is a hypermetabolic prevascular lymph
node and left hilar lymph nodes. There is a large left pleural
effusion.

CHEST

No hypermetabolic mediastinal or hilar nodes. No suspicious
pulmonary nodules on the CT scan.

ABDOMEN/PELVIS

There is a hypermetabolic left periaortic retroperitoneal mass
measuring 4.3 x 3.2 cm with SUV max equal 8.6. This is likely a
conglomerate of lymph nodes. Hypermetabolic retrocrural lymph nodes
are also noted.

There is a hypermetabolic focus within the medial aspect of the
spleen consistent metastasis (image 34). There small hypermetabolic
periaortic lymph nodes just above the bifurcation. No hypermetabolic
nodes in the iliac stations. Mild metabolic activity associated with
a right inguinal lymph node

SKELETON

There are least 4 sites of hypermetabolic skeletal metastasis.
Hypermetabolic focus in the proximal right humerus, image 10, with
SUV max 3.9. There is hypermetabolic focus within the mid thoracic
spine atT8 with SUV max 5.7. No clear lesion on CT portion. Intense
metabolic lesion within the L3 vertebral body with a associated
sclerotic lesion on CT and SUV max 9.4. Additional subtle metastasis
within the right ischium.
IMPRESSION: 1. Hypermetabolic nodal metastasis in the left supraclavicular and
left axillary nodal stations.
2. Hypermetabolic mediastinal and left hilar metastasis.
3. Bulky hypermetabolic left periaortic nodal mass.
4. Hypermetabolic solitary splenic metastasis.
5. Hypermetabolic skeletal metastasis involving the right humerus,
thoracic spine, lumbar spine, and pelvis.
6. Large left effusion.

## 2015-12-23 DIAGNOSIS — Z961 Presence of intraocular lens: Secondary | ICD-10-CM | POA: Diagnosis not present

## 2015-12-23 DIAGNOSIS — H43813 Vitreous degeneration, bilateral: Secondary | ICD-10-CM | POA: Diagnosis not present

## 2015-12-23 DIAGNOSIS — G510 Bell's palsy: Secondary | ICD-10-CM | POA: Diagnosis not present

## 2016-01-28 DIAGNOSIS — G510 Bell's palsy: Secondary | ICD-10-CM | POA: Diagnosis not present

## 2016-01-28 DIAGNOSIS — S0502XA Injury of conjunctiva and corneal abrasion without foreign body, left eye, initial encounter: Secondary | ICD-10-CM | POA: Diagnosis not present

## 2016-01-31 DIAGNOSIS — G510 Bell's palsy: Secondary | ICD-10-CM | POA: Diagnosis not present

## 2016-01-31 DIAGNOSIS — S0502XD Injury of conjunctiva and corneal abrasion without foreign body, left eye, subsequent encounter: Secondary | ICD-10-CM | POA: Diagnosis not present

## 2016-02-03 DIAGNOSIS — G510 Bell's palsy: Secondary | ICD-10-CM | POA: Diagnosis not present

## 2016-02-03 DIAGNOSIS — S0502XD Injury of conjunctiva and corneal abrasion without foreign body, left eye, subsequent encounter: Secondary | ICD-10-CM | POA: Diagnosis not present

## 2016-02-10 DIAGNOSIS — C50919 Malignant neoplasm of unspecified site of unspecified female breast: Secondary | ICD-10-CM | POA: Diagnosis not present

## 2016-02-10 DIAGNOSIS — F419 Anxiety disorder, unspecified: Secondary | ICD-10-CM | POA: Diagnosis not present

## 2016-02-10 DIAGNOSIS — N182 Chronic kidney disease, stage 2 (mild): Secondary | ICD-10-CM | POA: Diagnosis not present

## 2016-02-10 DIAGNOSIS — Z1389 Encounter for screening for other disorder: Secondary | ICD-10-CM | POA: Diagnosis not present

## 2016-02-10 DIAGNOSIS — Z0000 Encounter for general adult medical examination without abnormal findings: Secondary | ICD-10-CM | POA: Diagnosis not present

## 2016-02-10 DIAGNOSIS — H6983 Other specified disorders of Eustachian tube, bilateral: Secondary | ICD-10-CM | POA: Diagnosis not present

## 2016-02-10 DIAGNOSIS — J449 Chronic obstructive pulmonary disease, unspecified: Secondary | ICD-10-CM | POA: Diagnosis not present

## 2016-02-10 DIAGNOSIS — E782 Mixed hyperlipidemia: Secondary | ICD-10-CM | POA: Diagnosis not present

## 2016-02-10 DIAGNOSIS — I10 Essential (primary) hypertension: Secondary | ICD-10-CM | POA: Diagnosis not present

## 2016-02-10 DIAGNOSIS — E039 Hypothyroidism, unspecified: Secondary | ICD-10-CM | POA: Diagnosis not present

## 2016-02-22 DIAGNOSIS — J209 Acute bronchitis, unspecified: Secondary | ICD-10-CM | POA: Diagnosis not present

## 2016-02-22 DIAGNOSIS — Z720 Tobacco use: Secondary | ICD-10-CM | POA: Diagnosis not present

## 2016-02-22 DIAGNOSIS — J0140 Acute pansinusitis, unspecified: Secondary | ICD-10-CM | POA: Diagnosis not present

## 2016-04-22 DIAGNOSIS — Z8639 Personal history of other endocrine, nutritional and metabolic disease: Secondary | ICD-10-CM | POA: Diagnosis not present

## 2016-04-22 DIAGNOSIS — J0100 Acute maxillary sinusitis, unspecified: Secondary | ICD-10-CM | POA: Diagnosis not present

## 2016-04-22 DIAGNOSIS — R05 Cough: Secondary | ICD-10-CM | POA: Diagnosis not present

## 2016-06-07 DIAGNOSIS — R112 Nausea with vomiting, unspecified: Secondary | ICD-10-CM | POA: Diagnosis not present

## 2016-06-07 DIAGNOSIS — J449 Chronic obstructive pulmonary disease, unspecified: Secondary | ICD-10-CM | POA: Diagnosis not present

## 2016-06-07 DIAGNOSIS — F17200 Nicotine dependence, unspecified, uncomplicated: Secondary | ICD-10-CM | POA: Diagnosis not present

## 2016-06-07 DIAGNOSIS — R0602 Shortness of breath: Secondary | ICD-10-CM | POA: Diagnosis not present

## 2016-06-07 DIAGNOSIS — J188 Other pneumonia, unspecified organism: Secondary | ICD-10-CM | POA: Diagnosis not present

## 2016-06-07 DIAGNOSIS — R05 Cough: Secondary | ICD-10-CM | POA: Diagnosis not present

## 2016-06-07 DIAGNOSIS — E039 Hypothyroidism, unspecified: Secondary | ICD-10-CM | POA: Diagnosis not present

## 2016-06-07 DIAGNOSIS — Z8701 Personal history of pneumonia (recurrent): Secondary | ICD-10-CM | POA: Diagnosis not present

## 2016-06-07 DIAGNOSIS — E860 Dehydration: Secondary | ICD-10-CM | POA: Diagnosis not present

## 2016-06-22 DIAGNOSIS — R05 Cough: Secondary | ICD-10-CM | POA: Diagnosis not present

## 2016-06-22 DIAGNOSIS — J40 Bronchitis, not specified as acute or chronic: Secondary | ICD-10-CM | POA: Diagnosis not present

## 2016-06-29 DIAGNOSIS — J40 Bronchitis, not specified as acute or chronic: Secondary | ICD-10-CM | POA: Diagnosis not present

## 2016-06-29 DIAGNOSIS — R499 Unspecified voice and resonance disorder: Secondary | ICD-10-CM | POA: Diagnosis not present

## 2016-06-29 DIAGNOSIS — L989 Disorder of the skin and subcutaneous tissue, unspecified: Secondary | ICD-10-CM | POA: Diagnosis not present

## 2016-07-03 DIAGNOSIS — J90 Pleural effusion, not elsewhere classified: Secondary | ICD-10-CM | POA: Diagnosis not present

## 2016-07-03 DIAGNOSIS — F17200 Nicotine dependence, unspecified, uncomplicated: Secondary | ICD-10-CM | POA: Diagnosis not present

## 2016-07-03 DIAGNOSIS — J988 Other specified respiratory disorders: Secondary | ICD-10-CM | POA: Diagnosis not present

## 2016-07-03 DIAGNOSIS — R911 Solitary pulmonary nodule: Secondary | ICD-10-CM | POA: Diagnosis not present

## 2016-07-19 DIAGNOSIS — R05 Cough: Secondary | ICD-10-CM | POA: Diagnosis not present

## 2016-07-19 DIAGNOSIS — Z87891 Personal history of nicotine dependence: Secondary | ICD-10-CM | POA: Diagnosis not present

## 2016-07-19 DIAGNOSIS — R42 Dizziness and giddiness: Secondary | ICD-10-CM | POA: Diagnosis not present

## 2016-07-19 DIAGNOSIS — E860 Dehydration: Secondary | ICD-10-CM | POA: Diagnosis not present

## 2016-07-19 DIAGNOSIS — E039 Hypothyroidism, unspecified: Secondary | ICD-10-CM | POA: Diagnosis not present

## 2016-07-19 DIAGNOSIS — R531 Weakness: Secondary | ICD-10-CM | POA: Diagnosis not present

## 2016-07-19 DIAGNOSIS — R5381 Other malaise: Secondary | ICD-10-CM | POA: Diagnosis not present

## 2016-07-19 DIAGNOSIS — Z79899 Other long term (current) drug therapy: Secondary | ICD-10-CM | POA: Diagnosis not present

## 2016-07-19 DIAGNOSIS — R0989 Other specified symptoms and signs involving the circulatory and respiratory systems: Secondary | ICD-10-CM | POA: Diagnosis not present

## 2016-07-19 DIAGNOSIS — J90 Pleural effusion, not elsewhere classified: Secondary | ICD-10-CM | POA: Diagnosis not present

## 2016-07-20 DIAGNOSIS — R531 Weakness: Secondary | ICD-10-CM | POA: Diagnosis not present

## 2016-07-20 DIAGNOSIS — R0602 Shortness of breath: Secondary | ICD-10-CM | POA: Diagnosis not present

## 2016-07-20 DIAGNOSIS — J0140 Acute pansinusitis, unspecified: Secondary | ICD-10-CM | POA: Diagnosis not present

## 2016-07-20 DIAGNOSIS — R938 Abnormal findings on diagnostic imaging of other specified body structures: Secondary | ICD-10-CM | POA: Diagnosis not present

## 2016-07-20 DIAGNOSIS — R05 Cough: Secondary | ICD-10-CM | POA: Diagnosis not present

## 2016-07-28 DIAGNOSIS — Z720 Tobacco use: Secondary | ICD-10-CM | POA: Diagnosis not present

## 2016-07-28 DIAGNOSIS — C50919 Malignant neoplasm of unspecified site of unspecified female breast: Secondary | ICD-10-CM | POA: Diagnosis not present

## 2016-07-28 DIAGNOSIS — R918 Other nonspecific abnormal finding of lung field: Secondary | ICD-10-CM | POA: Diagnosis not present

## 2016-07-28 DIAGNOSIS — E039 Hypothyroidism, unspecified: Secondary | ICD-10-CM | POA: Diagnosis not present

## 2016-08-14 DIAGNOSIS — C50919 Malignant neoplasm of unspecified site of unspecified female breast: Secondary | ICD-10-CM | POA: Diagnosis not present

## 2016-08-14 DIAGNOSIS — R918 Other nonspecific abnormal finding of lung field: Secondary | ICD-10-CM | POA: Diagnosis not present

## 2016-08-14 DIAGNOSIS — E039 Hypothyroidism, unspecified: Secondary | ICD-10-CM | POA: Diagnosis not present

## 2016-08-25 DIAGNOSIS — J449 Chronic obstructive pulmonary disease, unspecified: Secondary | ICD-10-CM | POA: Diagnosis not present

## 2016-08-25 DIAGNOSIS — F17210 Nicotine dependence, cigarettes, uncomplicated: Secondary | ICD-10-CM | POA: Diagnosis not present

## 2016-08-25 DIAGNOSIS — I129 Hypertensive chronic kidney disease with stage 1 through stage 4 chronic kidney disease, or unspecified chronic kidney disease: Secondary | ICD-10-CM | POA: Diagnosis not present

## 2016-08-25 DIAGNOSIS — C50919 Malignant neoplasm of unspecified site of unspecified female breast: Secondary | ICD-10-CM | POA: Diagnosis not present

## 2016-08-25 DIAGNOSIS — J910 Malignant pleural effusion: Secondary | ICD-10-CM | POA: Diagnosis not present

## 2016-08-25 DIAGNOSIS — E785 Hyperlipidemia, unspecified: Secondary | ICD-10-CM | POA: Diagnosis not present

## 2016-08-25 DIAGNOSIS — C7981 Secondary malignant neoplasm of breast: Secondary | ICD-10-CM | POA: Diagnosis not present

## 2016-08-25 DIAGNOSIS — N189 Chronic kidney disease, unspecified: Secondary | ICD-10-CM | POA: Diagnosis not present

## 2016-09-04 DIAGNOSIS — C50919 Malignant neoplasm of unspecified site of unspecified female breast: Secondary | ICD-10-CM | POA: Diagnosis not present

## 2016-09-04 DIAGNOSIS — R918 Other nonspecific abnormal finding of lung field: Secondary | ICD-10-CM | POA: Diagnosis not present

## 2016-09-04 DIAGNOSIS — Z720 Tobacco use: Secondary | ICD-10-CM | POA: Diagnosis not present

## 2016-09-05 ENCOUNTER — Telehealth: Payer: Self-pay | Admitting: *Deleted

## 2016-09-05 NOTE — Telephone Encounter (Signed)
This RN returned call to pt per her VM stating need to return to [facility] to see Dr Jana Hakim.  Per phone discussion- Brianna Duke states she is hoping to get in ASAP - even this week, due to " my lung is messed up and I need to see Dr Jana Hakim again "  Noted per care every where pt has had surgery and films obtained in [place] with above abnormality noted.  LOS sent to scheduling for appointment.

## 2016-09-06 ENCOUNTER — Telehealth: Payer: Self-pay | Admitting: Oncology

## 2016-09-06 NOTE — Telephone Encounter (Signed)
lvm re 5/4 appt at 130 pm per LOS

## 2016-09-07 ENCOUNTER — Other Ambulatory Visit: Payer: Self-pay

## 2016-09-07 ENCOUNTER — Telehealth: Payer: Self-pay

## 2016-09-07 DIAGNOSIS — C50912 Malignant neoplasm of unspecified site of left female breast: Secondary | ICD-10-CM

## 2016-09-07 DIAGNOSIS — C50911 Malignant neoplasm of unspecified site of right female breast: Secondary | ICD-10-CM

## 2016-09-07 NOTE — Telephone Encounter (Signed)
Pt called to let us know she will be at her appt on Friday at 130pm.

## 2016-09-08 ENCOUNTER — Ambulatory Visit (HOSPITAL_BASED_OUTPATIENT_CLINIC_OR_DEPARTMENT_OTHER): Payer: Medicare Other | Admitting: Adult Health

## 2016-09-08 ENCOUNTER — Other Ambulatory Visit: Payer: Medicare Other

## 2016-09-08 ENCOUNTER — Encounter: Payer: Self-pay | Admitting: Adult Health

## 2016-09-08 ENCOUNTER — Ambulatory Visit (HOSPITAL_COMMUNITY)
Admission: RE | Admit: 2016-09-08 | Discharge: 2016-09-08 | Disposition: A | Discharge status: Home / Self Care | Payer: Medicare Other | Source: Ambulatory Visit | Attending: Adult Health | Admitting: Adult Health

## 2016-09-08 ENCOUNTER — Ambulatory Visit (HOSPITAL_BASED_OUTPATIENT_CLINIC_OR_DEPARTMENT_OTHER): Payer: Medicare Other

## 2016-09-08 VITALS — BP 140/73 | HR 75 | Temp 98.0°F | Resp 18 | Ht 63.0 in | Wt 118.3 lb

## 2016-09-08 VITALS — BP 107/76 | HR 70 | Temp 96.9°F | Resp 18

## 2016-09-08 DIAGNOSIS — C50811 Malignant neoplasm of overlapping sites of right female breast: Secondary | ICD-10-CM

## 2016-09-08 DIAGNOSIS — Z5111 Encounter for antineoplastic chemotherapy: Secondary | ICD-10-CM | POA: Diagnosis not present

## 2016-09-08 DIAGNOSIS — C778 Secondary and unspecified malignant neoplasm of lymph nodes of multiple regions: Secondary | ICD-10-CM

## 2016-09-08 DIAGNOSIS — J910 Malignant pleural effusion: Secondary | ICD-10-CM

## 2016-09-08 DIAGNOSIS — J90 Pleural effusion, not elsewhere classified: Secondary | ICD-10-CM | POA: Diagnosis not present

## 2016-09-08 DIAGNOSIS — C50912 Malignant neoplasm of unspecified site of left female breast: Secondary | ICD-10-CM

## 2016-09-08 DIAGNOSIS — R911 Solitary pulmonary nodule: Secondary | ICD-10-CM | POA: Diagnosis not present

## 2016-09-08 DIAGNOSIS — C50911 Malignant neoplasm of unspecified site of right female breast: Secondary | ICD-10-CM

## 2016-09-08 LAB — CBC WITH DIFFERENTIAL/PLATELET
BASO%: 0.8 % (ref 0.0–2.0)
BASOS ABS: 0.1 10*3/uL (ref 0.0–0.1)
EOS ABS: 0.2 10*3/uL (ref 0.0–0.5)
EOS%: 3.7 % (ref 0.0–7.0)
HEMATOCRIT: 36.4 % (ref 34.8–46.6)
HEMOGLOBIN: 12 g/dL (ref 11.6–15.9)
LYMPH#: 2.1 10*3/uL (ref 0.9–3.3)
LYMPH%: 31.8 % (ref 14.0–49.7)
MCH: 26.2 pg (ref 25.1–34.0)
MCHC: 32.9 g/dL (ref 31.5–36.0)
MCV: 79.4 fL — AB (ref 79.5–101.0)
MONO#: 0.5 10*3/uL (ref 0.1–0.9)
MONO%: 8.2 % (ref 0.0–14.0)
NEUT%: 55.5 % (ref 38.4–76.8)
NEUTROS ABS: 3.6 10*3/uL (ref 1.5–6.5)
Platelets: 306 10*3/uL (ref 145–400)
RBC: 4.59 10*6/uL (ref 3.70–5.45)
RDW: 15.8 % — ABNORMAL HIGH (ref 11.2–14.5)
WBC: 6.6 10*3/uL (ref 3.9–10.3)

## 2016-09-08 LAB — COMPREHENSIVE METABOLIC PANEL
ALBUMIN: 3.4 g/dL — AB (ref 3.5–5.0)
ALK PHOS: 108 U/L (ref 40–150)
ALT: 10 U/L (ref 0–55)
AST: 14 U/L (ref 5–34)
Anion Gap: 10 mEq/L (ref 3–11)
BILIRUBIN TOTAL: 0.48 mg/dL (ref 0.20–1.20)
BUN: 11.8 mg/dL (ref 7.0–26.0)
CALCIUM: 9.4 mg/dL (ref 8.4–10.4)
CO2: 26 mEq/L (ref 22–29)
Chloride: 95 mEq/L — ABNORMAL LOW (ref 98–109)
Creatinine: 0.8 mg/dL (ref 0.6–1.1)
EGFR: 69 mL/min/{1.73_m2} — AB (ref >90)
GLUCOSE: 71 mg/dL (ref 70–140)
Potassium: 4.2 mEq/L (ref 3.5–5.1)
SODIUM: 131 meq/L — AB (ref 136–145)
TOTAL PROTEIN: 6.6 g/dL (ref 6.4–8.3)

## 2016-09-08 MED ORDER — FULVESTRANT 250 MG/5ML IM SOLN
500.0000 mg | Freq: Once | INTRAMUSCULAR | Status: AC
  Administered 2016-09-08: 500 mg via INTRAMUSCULAR
  Filled 2016-09-08: qty 10

## 2016-09-08 NOTE — Progress Notes (Signed)
[name]  Telephone:([phone] Fax:([phone]) [phone]     ID: Brianna Duke OB: 1928-05-07  MR#: [id number]  QBH#:[id number]  PCP: Brianna Low, MD GYN:   SU:  OTHER MD: Brianna Duke, Brianna Duke  CHIEF COMPLAINT: Metastatic breast cancer   CURRENT TREATMENT: fulvestrant  BREAST CANCER HISTORY: From Dr Brianna Duke intake note dated 11/05/2007:  "She presented to Dr. Lysle Duke back in February for a complete physical exam.  She has a history of breast cancer treated back in 1992 by Dr. Danny Duke.  She had had a left lumpectomy by Dr. Kathrin Duke for a reasonably large lesion, although I do not have any of the detail and she also had lymph nodes involved at that time, which I believe, 5 of 15 nodes were involved.  Pathology department no longer has this information.  She did have external beam radiation therapy for this.  We are awaiting that information as well.  For her complete physical, he recommended screening mammogram.  This was performed March 3 and it showed a possible mass noted in the right breast.  Spot compression views and possibly ultrasound were recommended for further evaluation.  Left breast was unremarkable.  She came back for that on March 27 and this showed that the question small mass in the right breast at the 12 o'clock position persisted measuring 4 cm from the nipple, a 0.76 x 0.9 cm hypoechoic mass correlating to the mammographic findings.  They recommended further workup.  On March 30, she had a core biopsy and this showed invasive ductal carcinoma ER/PR positive, HER-2/neu negative.  She went on to have an MRI of the bilateral breasts on April 9 and it showed a 1.1 x 0.8 x 0.5 known invasive ductal carcinoma at the 12 o'clock position of the right breast with an adjacent 3 mm possible satellite lesion immediately adjacent to the posteroinferior aspect of this mass.  There were multiple additional right breast nodules 4 mm or less in maximum  diameter most likely representing normal background nodular parenchyma.  None of these were large enough to warrant a biopsy at the time.  There was no background parenchymal nodularity or enhancement in the left breast possibly due to previous radiation.  There was asymmetrical and mildly prominent right axillary and retropectoral lymph nodes and a second look ultrasound of the right axilla was recommended.  She was given an ultrasound appointment at 11 a.m. on April 10; however, she called in and canceled that appointment saying she does not want to reschedule.  She told me that she had gone up to [place] and was considering not having any kind of treatment at all.  She had multiple small liver masses also noted on MRI, which were too small to accurately characterize on the current images, moderate-sized hiatal hernia.  She then finally returned to town and agreed to have surgery and chest x-ray on May 21, showed slight hyperinflation but no acute process seen.  She went on to have her lumpectomy and sentinel node biopsy by Dr. Brantley Duke on May 27.  This showed a 1.1 cm invasive ductal carcinoma grade 1 with Duke-grade DCIS with the DCIS margin being less than 0.1 cm in the lateral superficial resection margins.  Two sentinel nodes were negative.  Dr. Brantley Duke discussed with her the need for radiation and has sent her here for our opinion regarding the role of radiation.  He also discussed with her hormone receptor status being positive, but the patient declined taking any  sort of medication or even having a medical oncology referral."  The patient did complete radiation to the right breast 12/14/2007, with a boost to a total dose of 6280 cGy . She was subsequently lost to followup.  More recently the patient noted left sided supraclavicular adenopathy. She brought this to the attention of Dr. Ernesto Duke who obtained a chest CT 07/16/2013 at [facility]. This showed left supraclavicular and anterior mediastinal  adenopathy, a left-sided pleural effusion on with evidence of left-sided pleural disease, a left adrenal mass and probable retroperitoneal adenopathy, several splenic lesions, some right retrocrural adenopathy, nonspecific liver lesions and also evidence of aortic atherosclerosis and emphysema. On 07/21/2013 the patient underwent left thoracentesis, in the pleural fluid showed ([id number]) malignant cells consistent with breast adenocarcinoma, estrogen receptor 35% positive, progesterone receptor 22% positive, with no HER-2 amplification, the signals ratio being 1.40 and the number per cell 1.75.  Her subsequent history is as detailed below.  INTERVAL HISTORY: Brianna Duke returns today for follow up of her metastatic breast cancer. She is with her long time friend Brianna Duke.  Brianna Duke says she underwent CT scan and was found to have lung mass and pleural effusion.  She said that she has had fluid drained previously.  She has suffered from [place] in the past couple of years also.  She says her brain has been imaged and is normal.  She continues to be a current every day smoke of 1ppd, and has a 56 pack year tobacco history.  She last received Fulvestrant in 02/2015 when she was seen.    ROS: Brianna Duke denies any fever,s chills, new pain, nausea, vomiting, light headedness or further concerns.    PAST MEDICAL HISTORY: Past Medical History:  Diagnosis Date  . Anxiety   . Baker's cyst of knee    right knee  . Breast cancer ([place]) 05/31/00    left lower outer   . Bronchitis    history  . Cataract    surgery onboth  . Chronic kidney disease    Duke 2  . COPD (chronic obstructive pulmonary disease) ([place])   . Depression   . History of radiation therapy 06/15/00- 08/22/00   left breast, 5940 cGy 33 fractions  . Hx of radiation therapy 11/18/07- 01/02/08   right breast 2880 cGy 16 fractions, right breast 2000 cGy 10 fractions, right breast boost 1400 cGy 7 fractions  . Hyperlipidemia   . Hypertension   . Insomnia    . Malignant neoplasm of breast (female), unspecified site ([place]) 10/02/07   right 12 o'clock , invasive ductal  . Meniere's disease    surgery  . Metastases to the liver [facility])    breast primary  . Metastasis to adrenal gland ([place])   . Metastasis to spleen ([place])   . Pleural effusion, malignant 07/21/13   left lung, breast primary  . Retinal detachment    history of  . Skin cancer    squamous cell- face  . Thyroid disease    hypothyroid  . Varicose veins     PAST SURGICAL HISTORY: Past Surgical History:  Procedure Laterality Date  . APPENDECTOMY     83 yrs old  . PITUITARY SURGERY  2005   adenoma removed  . THYROIDECTOMY, PARTIAL  1991  . TONSILLECTOMY     age 58    FAMILY HISTORY Family History  Problem Relation Age of Onset  . Cancer Mother   . Cancer Sister     ovarian  . Cancer Sister 66  breast   the patient's father died at the age of 70 in a tractor accident. The patient is one of 9 siblings. Only at 2 siblings survive in addition to her. One is her brother Carmin Muskrat who lives in [place], and sister Alisia Ferrari who lives in [place].Marland KitchenHe can be reached at [phone].   GYNECOLOGIC HISTORY:  Menarche age 12. The patient is GX P0. 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 She moved to [place] from [place] around that time. She is single. She lives alone, with no pets. She attends the good [employer]. In addition to her siblings, listed above, she frequently visits her niece Meryl Dare in [place]. She can be reached at [phone], and she is also close to her friend Renne Musca who lives in [place].  ADVANCED DIRECTIVES: Not in place; on her 09/04/2013 visit the patient was given healthcare power of attorney and living will documents to complete and notarize  at her discretion; "the family should take care of all that" if something happens to her   HEALTH MAINTENANCE: Social History  Substance Use Topics  . Smoking status: Current Every Day Smoker    Packs/day: 1.00    Years: 30.00    Types: Cigarettes  . Smokeless tobacco: Not on file  . Alcohol use Yes     Comment: rarely   Allergies  Allergen Reactions  . Tylenol [Acetaminophen] Hives    Current Outpatient Prescriptions  Medication Sig Dispense Refill  . levothyroxine (SYNTHROID, LEVOTHROID) 75 MCG tablet Take 75 mcg by mouth daily before breakfast.    . LORazepam (ATIVAN) 1 MG tablet TAKE 1 TABLET BY MOUTH TWICE A DY AS NEEDED FOR ANXIETY 30 tablet 1  . Multiple Minerals (CALCIUM/MAGNESIUM/ZINC PO) Take 1 tablet by mouth every morning.    . mupirocin cream (BACTROBAN) 2 % APPLY TO AFFECTED AREA TWICE A DAY AS DIRECTED  0  . naproxen sodium (ANAPROX) 220 MG tablet Take 220 mg by mouth 2 (two) times daily as needed.      No current facility-administered medications for this visit.     OBJECTIVE:  Vitals:   09/08/16 1320  BP: 140/73  Pulse: 75  Resp: 18  Temp: 98 F (36.7 C)     Body mass index is 20.96 kg/m.      ECOG FS:1 - Symptomatic but completely ambulatory GENERAL: Patient is an older female in no acute distress HEENT:  Sclerae anicteric.  Oropharynx clear and moist. No ulcerations or evidence of oropharyngeal candidiasis. Neck is supple.  NODES:  No cervical, supraclavicular, or axillary lymphadenopathy palpated.  BREAST EXAM:  No breast nodules, masses, skin, or nipple changes bilaterally LUNGS:  Clear to auscultation bilaterally.  No wheezes or rhonchi. Dull to percussion in left lower lobe HEART:  Regular rate and rhythm. No murmur appreciated. ABDOMEN:  Soft, nontender.  Positive, normoactive bowel sounds. No organomegaly palpated. MSK:  No focal spinal tenderness to palpation. Full range of motion bilaterally in the upper extremities. EXTREMITIES:  No  peripheral edema.   SKIN:  Clear with no obvious rashes or skin changes. No nail dyscrasia. NEURO:  Nonfocal. Well oriented.  Appropriate affect.    LAB RESULTS:  CMP     Component Value Date/Time   NA 138 03/04/2015 1233   K 4.1 03/04/2015 1233   CL  104 09/26/2007 1206   CO2 25 03/04/2015 1233   GLUCOSE 108 03/04/2015 1233   BUN 12.3 03/04/2015 1233   CREATININE 0.9 03/04/2015 1233   CALCIUM 9.5 03/04/2015 1233   PROT 6.5 03/04/2015 1233   ALBUMIN 3.8 03/04/2015 1233   AST 13 03/04/2015 1233   ALT <9 Repeated and Verified 03/04/2015 1233   ALKPHOS 90 03/04/2015 1233   BILITOT 0.42 03/04/2015 1233   GFRNONAA >60 09/26/2007 1206   GFRAA  09/26/2007 1206    >60        The eGFR has been calculated using the MDRD equation. This calculation has not been validated in all clinical   I No results found for: SPEP  Lab Results  Component Value Date   WBC 6.6 09/08/2016   NEUTROABS 3.6 09/08/2016   HGB 12.0 09/08/2016   HCT 36.4 09/08/2016   MCV 79.4 (L) 09/08/2016   PLT 306 09/08/2016      Chemistry      Component Value Date/Time   NA 138 03/04/2015 1233   K 4.1 03/04/2015 1233   CL 104 09/26/2007 1206   CO2 25 03/04/2015 1233   BUN 12.3 03/04/2015 1233   CREATININE 0.9 03/04/2015 1233      Component Value Date/Time   CALCIUM 9.5 03/04/2015 1233   ALKPHOS 90 03/04/2015 1233   AST 13 03/04/2015 1233   ALT <9 Repeated and Verified 03/04/2015 1233   BILITOT 0.42 03/04/2015 1233       No results found for: LABCA2  No components found for: LABCA125  No results for input(s): INR in the last 168 hours.  Urinalysis No results found for: COLORURINE  STUDIES: No results found.  ASSESSMENT: 83 y.o. [place] woman with a history of bilateral breast cancer, now Duke IV, as follows  (1) LEFT BREAST:  (a) status post left excisional biopsy 05/14/2000 for a pT2 NX, Duke II invasive lobular breast cancer, estrogen receptor 83% positive, progesterone receptor  90% positive, HER-2 negative, with positive margins   (b) left breast reexcision for margin clearance, with left axillary lymph node dissection 05/31/2000, with negative margins but 5/15 axillary lymph nodes involved, final Duke pT2 pN2, Duke IIIA  (c) status post radiation to the left breast (not to axilla)  (d) on tamoxifen approximately 5 years per patient report  (2) RIGHT BREAST:  (a) status post right lumpectomy and sentinel lymph node sampling 05/272009 for a pT1c pN0, Duke IA invasive ductal carcinoma, grade 1, estrogen receptor 99% positive, progesterone receptor and 98% positive, with an MIB-1 of 11%, and no HER-2 amplification.  (b) status post radiation to the right breast completed August 2009  (c) the patient refused antiestrogen therapy and medical oncology evaluation  (3) METASTATIC DISEASE:  (a) staging studies March and April 2015 show extensive metastatic adenopathy (left supraclavicular and left axillary,. retrocrural, etc.), with splenic involvement, and with significant left pleural involvement and a large left pleural effusion; bone involvement was questionable   (b) left thoracentesis 07/21/2013 confirms a malignant effusion, with adenocarcinoma cells positive for estrogen and progesterone receptor, negative for HER-2 amplification  (c) fulvestrant started 09/04/2013, discontinued at patient's request after 05/28/2014 dose despite continuing response  (d) anastrozole started 07/01/2014, stopped after 1 month  (d) switched back to fulvestrant starting 10/08/2014  (4) continuing tobacco abuse  PLAN: Brianna Duke's labs are stable today and I reviewed these with her.  She saw Dr. Lianne Moris, in [place] surgery who felt that the patient is not a surgical candidate for pleurodesis,  or longstanding drain such as pleurx.  We will await the CT chest done at the outside hospital to decide what needs to be done.  She will receive Fulvestrant today in addition to undergo a chest xray.  This will  happen every 4 weeks.  Smoking cessation was discussed.  Brianna Duke is not yet ready to quit.  Brianna Duke will return in 4 weeks for labs, chest xray, evaluation by myself or Dr. Jana Hakim, and Fulvestrant injection.    Scot Dock, NP  09/08/2016 1:33 PM     ADDENDUM: I met with Brianna Duke and the friend and caregiver with whom she is currently living. Deshawna situation is difficult because she has missed multiple treatments and appointments and because we do not have access to her most recent staging scans.  In general however I see no better plan then to resume her fulvestrant and continue to follow up. She now lives 2 hours away and [place] would be closer. I offered referral to the [facility] group for breast cancer, but she is established here and wishes to continue to come here. Since the visits will only be every 4 weeks her caregiver/friend/landlord/driver feels that is feasible.  Accordingly the plan is to obtain chest x-rays each visit and follow results. We have many other options that we can use if the patient is compliant and we could add palbociclib to her treatments but again I would not want to do that unless she shows for at least the next 2 monthly doses of fulvestrant.  I personally saw this patient and performed a substantive portion of this encounter with the listed APP documented above.   Chauncey Cruel, MD Medical Oncology and Hematology [facility] [address] [place], [place] [address] Tel. [phone]    Fax. [phone]

## 2016-09-12 DIAGNOSIS — N182 Chronic kidney disease, stage 2 (mild): Secondary | ICD-10-CM | POA: Diagnosis not present

## 2016-09-12 DIAGNOSIS — J449 Chronic obstructive pulmonary disease, unspecified: Secondary | ICD-10-CM | POA: Diagnosis not present

## 2016-09-12 DIAGNOSIS — E039 Hypothyroidism, unspecified: Secondary | ICD-10-CM | POA: Diagnosis not present

## 2016-09-12 DIAGNOSIS — C50919 Malignant neoplasm of unspecified site of unspecified female breast: Secondary | ICD-10-CM | POA: Diagnosis not present

## 2016-09-12 DIAGNOSIS — I10 Essential (primary) hypertension: Secondary | ICD-10-CM | POA: Diagnosis not present

## 2016-09-14 ENCOUNTER — Other Ambulatory Visit: Payer: Self-pay | Admitting: Oncology

## 2016-09-14 NOTE — Progress Notes (Signed)
Brianna Duke brought [place] a copy of her CT scan of the chest performed in [facility] [place] [facility] on 07/03/2016. There were no pulmonary nodules. An apparent 9 mm right lower lobe nodule noted on a chest x-ray 06/07/2016 turn out to be a 1.0 cm calcification in the right breast.  There is a 5.5 cm masslike density in the left lower lobe associated with a mild to moderate left pleural effusion. The masslike density was felt possibly to be rounded atelectasis. There is some left lung volume loss. There are mild emphysema changes but no infiltrate, adenopathy, bronchiectasis, aneurysm, or pericardial effusion.  Chest x-ray on 09/08/2016 now shows a large left pleural effusion, with dense consolidation of the left lower lobe and lingula, as well as an enlarging nodule in the right middle lobe which dates back at least to September 2016.  On 07/21/2013 she underwent left thoracentesis and the cytology ([id number]) confirmed metastatic breast cancer, the malignant cells being estrogen receptor positive at 35%, progesterone receptor positive at 22%, and HER-2 not amplified with a signals ratio of 1.40  The plan is to continue fulvestrant as ordered, which the patient had stopped receiving, and consider last thoracentesis. Her next visit here is June 5 at noon and we can arrange for a thoracentesis later that day if the patient is nothing by mouth.

## 2016-10-10 ENCOUNTER — Other Ambulatory Visit (HOSPITAL_BASED_OUTPATIENT_CLINIC_OR_DEPARTMENT_OTHER): Payer: Medicare Other

## 2016-10-10 ENCOUNTER — Ambulatory Visit (HOSPITAL_BASED_OUTPATIENT_CLINIC_OR_DEPARTMENT_OTHER): Payer: Medicare Other

## 2016-10-10 ENCOUNTER — Ambulatory Visit (HOSPITAL_COMMUNITY)
Admission: RE | Admit: 2016-10-10 | Discharge: 2016-10-10 | Disposition: A | Discharge status: Home / Self Care | Payer: Medicare Other | Source: Ambulatory Visit | Attending: Adult Health | Admitting: Adult Health

## 2016-10-10 ENCOUNTER — Encounter: Payer: Self-pay | Admitting: Adult Health

## 2016-10-10 ENCOUNTER — Ambulatory Visit (HOSPITAL_BASED_OUTPATIENT_CLINIC_OR_DEPARTMENT_OTHER): Payer: Medicare Other | Admitting: Adult Health

## 2016-10-10 VITALS — BP 133/69 | HR 67 | Temp 97.8°F | Resp 18 | Ht 63.0 in | Wt 115.4 lb

## 2016-10-10 DIAGNOSIS — C50911 Malignant neoplasm of unspecified site of right female breast: Secondary | ICD-10-CM | POA: Diagnosis not present

## 2016-10-10 DIAGNOSIS — R911 Solitary pulmonary nodule: Secondary | ICD-10-CM | POA: Insufficient documentation

## 2016-10-10 DIAGNOSIS — I700 Atherosclerosis of aorta: Secondary | ICD-10-CM | POA: Insufficient documentation

## 2016-10-10 DIAGNOSIS — Z5111 Encounter for antineoplastic chemotherapy: Secondary | ICD-10-CM

## 2016-10-10 DIAGNOSIS — C50811 Malignant neoplasm of overlapping sites of right female breast: Secondary | ICD-10-CM

## 2016-10-10 DIAGNOSIS — Z170 Estrogen receptor positive status [ER+]: Secondary | ICD-10-CM | POA: Diagnosis not present

## 2016-10-10 DIAGNOSIS — C50919 Malignant neoplasm of unspecified site of unspecified female breast: Secondary | ICD-10-CM | POA: Diagnosis not present

## 2016-10-10 DIAGNOSIS — J90 Pleural effusion, not elsewhere classified: Secondary | ICD-10-CM | POA: Diagnosis not present

## 2016-10-10 DIAGNOSIS — C50912 Malignant neoplasm of unspecified site of left female breast: Secondary | ICD-10-CM

## 2016-10-10 DIAGNOSIS — C778 Secondary and unspecified malignant neoplasm of lymph nodes of multiple regions: Secondary | ICD-10-CM | POA: Diagnosis not present

## 2016-10-10 DIAGNOSIS — J910 Malignant pleural effusion: Secondary | ICD-10-CM

## 2016-10-10 LAB — CBC WITH DIFFERENTIAL/PLATELET
BASO%: 0.5 % (ref 0.0–2.0)
Basophils Absolute: 0 10*3/uL (ref 0.0–0.1)
EOS ABS: 0.3 10*3/uL (ref 0.0–0.5)
EOS%: 4.5 % (ref 0.0–7.0)
HCT: 37 % (ref 34.8–46.6)
HGB: 12 g/dL (ref 11.6–15.9)
LYMPH%: 33.1 % (ref 14.0–49.7)
MCH: 25.4 pg (ref 25.1–34.0)
MCHC: 32.6 g/dL (ref 31.5–36.0)
MCV: 77.9 fL — AB (ref 79.5–101.0)
MONO#: 0.6 10*3/uL (ref 0.1–0.9)
MONO%: 7.9 % (ref 0.0–14.0)
NEUT%: 54 % (ref 38.4–76.8)
NEUTROS ABS: 3.8 10*3/uL (ref 1.5–6.5)
Platelets: 273 10*3/uL (ref 145–400)
RBC: 4.75 10*6/uL (ref 3.70–5.45)
RDW: 15.8 % — AB (ref 11.2–14.5)
WBC: 7 10*3/uL (ref 3.9–10.3)
lymph#: 2.3 10*3/uL (ref 0.9–3.3)

## 2016-10-10 LAB — COMPREHENSIVE METABOLIC PANEL
ALBUMIN: 3.5 g/dL (ref 3.5–5.0)
ALK PHOS: 124 U/L (ref 40–150)
ALT: 9 U/L (ref 0–55)
AST: 15 U/L (ref 5–34)
Anion Gap: 11 mEq/L (ref 3–11)
BUN: 12.5 mg/dL (ref 7.0–26.0)
CALCIUM: 8.9 mg/dL (ref 8.4–10.4)
CHLORIDE: 99 meq/L (ref 98–109)
CO2: 23 mEq/L (ref 22–29)
CREATININE: 0.9 mg/dL (ref 0.6–1.1)
EGFR: 57 mL/min/{1.73_m2} — ABNORMAL LOW (ref >90)
Glucose: 74 mg/dl (ref 70–140)
Potassium: 4.2 mEq/L (ref 3.5–5.1)
Sodium: 134 mEq/L — ABNORMAL LOW (ref 136–145)
Total Bilirubin: 0.43 mg/dL (ref 0.20–1.20)
Total Protein: 6.2 g/dL — ABNORMAL LOW (ref 6.4–8.3)

## 2016-10-10 LAB — FERRITIN: Ferritin: 75 ng/ml (ref 9–269)

## 2016-10-10 MED ORDER — FULVESTRANT 250 MG/5ML IM SOLN
500.0000 mg | Freq: Once | INTRAMUSCULAR | Status: AC
  Administered 2016-10-10: 500 mg via INTRAMUSCULAR
  Filled 2016-10-10: qty 10

## 2016-10-10 NOTE — Progress Notes (Signed)
Brianna Duke  [phone] [phone]     ID: [name] OB: November 13, 1927  MR#: [id number]  THY#:[id number]  PCP: [name], MD GYN:   SU:  OTHER MD: [name], [name]  CHIEF COMPLAINT: Metastatic breast cancer   CURRENT TREATMENT: fulvestrant  BREAST CANCER HISTORY: From [name] intake note dated 11/05/2007:  "She presented to Dr. [name] back in February for a complete physical exam.  She has a history of breast cancer treated back in 1992 by Dr. [name].  She had had a left lumpectomy by Dr. [name] for a reasonably large lesion, although I do not have any of the detail and she also had lymph nodes involved at that time, which I believe, 5 of 15 nodes were involved.  Pathology department no longer has this information.  She did have external beam radiation therapy for this.  We are awaiting that information as well.  For her complete physical, he recommended screening mammogram.  This was performed March 3 and it showed a possible mass noted in the right breast.  Spot compression views and possibly ultrasound were recommended for further evaluation.  Left breast was unremarkable.  She came back for that on March 27 and this showed that the question small mass in the right breast at the 12 o'clock position persisted measuring 4 cm from the nipple, a 0.76 x 0.9 cm hypoechoic mass correlating to the mammographic findings.  They recommended further workup.  On March 30, she had a core biopsy and this showed invasive ductal carcinoma ER/PR positive, HER-2/neu negative.  She went on to have an MRI of the bilateral breasts on April 9 and it showed a 1.1 x 0.8 x 0.5 known invasive ductal carcinoma at the 12 o'clock position of the right breast with an adjacent 3 mm possible satellite lesion immediately adjacent to the posteroinferior aspect of this mass.  There were multiple additional right breast nodules 4 mm or less in maximum  diameter most likely representing normal background nodular parenchyma.  None of these were large enough to warrant a biopsy at the time.  There was no background parenchymal nodularity or enhancement in the left breast possibly due to previous radiation.  There was asymmetrical and mildly prominent right axillary and retropectoral lymph nodes and a second look ultrasound of the right axilla was recommended.  She was given an ultrasound appointment at 11 a.m. on April 10; however, she called in and canceled that appointment saying she does not want to reschedule.  She told me that she had gone up to [place] and was considering not having any kind of treatment at all.  She had multiple small liver masses also noted on MRI, which were too small to accurately characterize on the current images, moderate-sized hiatal hernia.  She then finally returned to town and agreed to have surgery and chest x-ray on May 21, showed slight hyperinflation but no acute process seen.  She went on to have her lumpectomy and sentinel node biopsy by [name] on May 27.  This showed a 1.1 cm invasive ductal carcinoma grade 1 with low-grade DCIS with the DCIS margin being less than 0.1 cm in the lateral superficial resection margins.  Two sentinel nodes were negative.  [name] discussed with her the need for radiation and has sent her here for our opinion regarding the role of radiation.  He also discussed with her hormone receptor status being positive, but the patient declined taking  any sort of medication or even having a medical oncology referral."  The patient did complete radiation to the right breast 12/14/2007, with a boost to a total dose of 6280 cGy . She was subsequently lost to followup.  More recently the patient noted left sided supraclavicular adenopathy. She brought this to the attention of Dr. [name] who obtained a chest CT 07/16/2013 at [facility]. This showed left supraclavicular and anterior mediastinal  adenopathy, a left-sided pleural effusion on with evidence of left-sided pleural disease, a left adrenal mass and probable retroperitoneal adenopathy, several splenic lesions, some right retrocrural adenopathy, nonspecific liver lesions and also evidence of aortic atherosclerosis and emphysema. On 07/21/2013 the patient underwent left thoracentesis, in the pleural fluid showed ([id number]) malignant cells consistent with breast adenocarcinoma, estrogen receptor 35% positive, progesterone receptor 22% positive, with no HER-2 amplification, the signals ratio being 1.40 and the number per cell 1.75.  Her subsequent history is as detailed below.  INTERVAL HISTORY: Brianna Duke is here today for evaluation prior to receiving Fulvestrant.  She is doing moderately well today.  She isn't having any new pain or shortness of breath.  She tolerated the last dose of Fulvestrant well.    ROS: [name] does report a weight loss of 5 pounds (3 per our scales).  She wants to know what to eat to keep up her weight.  She denies any new issues such as vision changes, headaches, weakness, numbness, new pain, bowel changes, nausea, vomiting, hot flashes, joint aches, or any further concerns.  A detailed ROS is non contributory.    PAST MEDICAL HISTORY: Past Medical History:  Diagnosis Date  . Anxiety   . Baker's cyst of knee    right knee  . Breast cancer ([place]) 05/31/00    left lower outer   . Bronchitis    history  . Cataract    surgery onboth  . Chronic kidney disease    stage 2  . COPD (chronic obstructive pulmonary disease) ([place])   . Depression   . History of radiation therapy 06/15/00- 08/22/00   left breast, 5940 cGy 33 fractions  . Hx of radiation therapy 11/18/07- 01/02/08   right breast 2880 cGy 16 fractions, right breast 2000 cGy 10 fractions, right breast boost 1400 cGy 7 fractions  . Hyperlipidemia   . Hypertension   . Insomnia   . Malignant neoplasm of breast (female), unspecified site 10/02/07   right 12  o'clock , invasive ductal  . Meniere's disease    surgery  . Metastases to the liver [facility])    breast primary  . Metastasis to adrenal gland ([place])   . Metastasis to spleen ([place])   . Pleural effusion, malignant 07/21/13   left lung, breast primary  . Retinal detachment    history of  . Skin cancer    squamous cell- face  . Thyroid disease    hypothyroid  . Varicose veins     PAST SURGICAL HISTORY: Past Surgical History:  Procedure Laterality Date  . APPENDECTOMY     83 yrs old  . PITUITARY SURGERY  2005   adenoma removed  . THYROIDECTOMY, PARTIAL  1991  . TONSILLECTOMY     age 33    FAMILY HISTORY Family History  Problem Relation Age of Onset  . Cancer Mother   . Cancer Sister        ovarian  . Cancer Sister 11       breast   the patient's father died at the age  of 69 in a tractor accident. The patient is one of 9 siblings. Only at 2 siblings survive in addition to her. One is her brother Carmin Muskrat who lives in [place], and sister Alisia Ferrari who lives in [place].Marland KitchenHe can be reached at [phone].   GYNECOLOGIC HISTORY:  Menarche age 21. The patient is GX P0. 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 She moved to [place] from [place] around that time. She is single. She lives alone, with no pets. She attends the good [employer]. In addition to her siblings, listed above, she frequently visits her niece Meryl Dare in [place]. She can be reached at [phone], and she is also close to her friend Renne Musca who lives in [place].  ADVANCED DIRECTIVES: Not in place; on her 09/04/2013 visit the patient was given healthcare power of attorney and living will documents to complete and notarize at her discretion; "the family should take care of all that" if something happens  to her   HEALTH MAINTENANCE: Social History  Substance Use Topics  . Smoking status: Current Every Day Smoker    Packs/day: 1.00    Years: 30.00    Types: Cigarettes  . Smokeless tobacco: Never Used  . Alcohol use Yes     Comment: rarely   Allergies  Allergen Reactions  . Tylenol [Acetaminophen] Hives    Current Outpatient Prescriptions  Medication Sig Dispense Refill  . levothyroxine (SYNTHROID, LEVOTHROID) 75 MCG tablet Take 75 mcg by mouth daily before breakfast.    . LORazepam (ATIVAN) 1 MG tablet TAKE 1 TABLET BY MOUTH TWICE A DY AS NEEDED FOR ANXIETY (Patient taking differently: TAKE 1 TABLET BY MOUTH EVERY NIGHT) 30 tablet 1  . naproxen sodium (ANAPROX) 220 MG tablet Take 220 mg by mouth 2 (two) times daily as needed.     . Multiple Minerals (CALCIUM/MAGNESIUM/ZINC PO) Take 1 tablet by mouth every morning.     No current facility-administered medications for this visit.     OBJECTIVE:  Vitals:   10/10/16 1255  BP: 133/69  Pulse: 67  Resp: 18  Temp: 97.8 F (36.6 C)     Body mass index is 20.44 kg/m.      ECOG FS:1 - Symptomatic but completely ambulatory GENERAL: Patient is an older female in no acute distress HEENT:  Sclerae anicteric.  Oropharynx clear and moist. No ulcerations or evidence of oropharyngeal candidiasis. Neck is supple.  NODES:  No cervical, supraclavicular, or axillary lymphadenopathy palpated.  BREAST EXAM: deferred LUNGS:  Clear to auscultation bilaterally.  No wheezes or rhonchi. Dull to percussion in left lower lobe HEART:  Regular rate and rhythm. No murmur appreciated. ABDOMEN:  Soft, nontender.  Positive, normoactive bowel sounds. No organomegaly palpated. MSK:  No focal spinal tenderness to palpation. Full range of motion bilaterally in the upper extremities. EXTREMITIES:  No peripheral edema.   SKIN:  Clear with no obvious rashes or skin changes. No nail dyscrasia. NEURO:  Nonfocal. Well oriented.  Appropriate affect.    LAB  RESULTS:  CMP     Component Value Date/Time   NA 131 (L) 09/08/2016 1255   K 4.2 09/08/2016 1255   CL 104 09/26/2007 1206   CO2 26 09/08/2016 1255   GLUCOSE 71 09/08/2016 1255   BUN 11.8 09/08/2016 1255   CREATININE 0.8 09/08/2016 1255  CALCIUM 9.4 09/08/2016 1255   PROT 6.6 09/08/2016 1255   ALBUMIN 3.4 (L) 09/08/2016 1255   AST 14 09/08/2016 1255   ALT 10 09/08/2016 1255   ALKPHOS 108 09/08/2016 1255   BILITOT 0.48 09/08/2016 1255   GFRNONAA >60 09/26/2007 1206   GFRAA  09/26/2007 1206    >60        The eGFR has been calculated using the MDRD equation. This calculation has not been validated in all clinical   I No results found for: SPEP  Lab Results  Component Value Date   WBC 6.6 09/08/2016   NEUTROABS 3.6 09/08/2016   HGB 12.0 09/08/2016   HCT 36.4 09/08/2016   MCV 79.4 (L) 09/08/2016   PLT 306 09/08/2016      Chemistry      Component Value Date/Time   NA 131 (L) 09/08/2016 1255   K 4.2 09/08/2016 1255   CL 104 09/26/2007 1206   CO2 26 09/08/2016 1255   BUN 11.8 09/08/2016 1255   CREATININE 0.8 09/08/2016 1255      Component Value Date/Time   CALCIUM 9.4 09/08/2016 1255   ALKPHOS 108 09/08/2016 1255   AST 14 09/08/2016 1255   ALT 10 09/08/2016 1255   BILITOT 0.48 09/08/2016 1255       No results found for: LABCA2  No components found for: LABCA125  No results for input(s): INR in the last 168 hours.  Urinalysis No results found for: COLORURINE  STUDIES: No results found.  ASSESSMENT: 83 y.o. [place] woman with a history of bilateral breast cancer, now stage IV, as follows  (1) LEFT BREAST:  (a) status post left excisional biopsy 05/14/2000 for a pT2 NX, stage II invasive lobular breast cancer, estrogen receptor 83% positive, progesterone receptor 90% positive, HER-2 negative, with positive margins   (b) left breast reexcision for margin clearance, with left axillary lymph node dissection 05/31/2000, with negative margins but 5/15  axillary lymph nodes involved, final stage pT2 pN2, stage IIIA  (c) status post radiation to the left breast (not to axilla)  (d) on tamoxifen approximately 5 years per patient report  (2) RIGHT BREAST:  (a) status post right lumpectomy and sentinel lymph node sampling 05/272009 for a pT1c pN0, stage IA invasive ductal carcinoma, grade 1, estrogen receptor 99% positive, progesterone receptor and 98% positive, with an MIB-1 of 11%, and no HER-2 amplification.  (b) status post radiation to the right breast completed August 2009  (c) the patient refused antiestrogen therapy and medical oncology evaluation  (3) METASTATIC DISEASE:  (a) staging studies March and April 2015 show extensive metastatic adenopathy (left supraclavicular and left axillary,. retrocrural, etc.), with splenic involvement, and with significant left pleural involvement and a large left pleural effusion; bone involvement was questionable   (b) left thoracentesis 07/21/2013 confirms a malignant effusion, with adenocarcinoma cells positive for estrogen and progesterone receptor, negative for HER-2 amplification  (c) fulvestrant started 09/04/2013, discontinued at patient's request after 05/28/2014 dose despite continuing response  (d) anastrozole started 07/01/2014, stopped after 1 month  (d) switched back to fulvestrant starting 10/08/2014--lost to follow up after 03/04/2015  (e) Resumed Fulvestrant on 09/08/2016  (4) continuing tobacco abuse  PLAN: Aneira's will have to add on labs after today's appointment.  She will proceed with Fulvestrant today and get a f/u chest xray.  I have put in standing orders for all of these.  She and I reviewed last months chest xray and she requested more information about Fulvestrant which I gave  her in her AVS.   Alezandra will return in 4 weeks for labs, chest xray, evaluation by Dr. Jana Hakim or determination of whether to add any additional treatment, and Fulvestrant injection.    A total of (30)  minutes of face-to-face time was spent with this patient with greater than 50% of that time in counseling and care-coordination.   Scot Dock, NP 10/10/2016 1:03 PM

## 2016-10-10 NOTE — Patient Instructions (Signed)
Fulvestrant injection  What is this medicine?  FULVESTRANT (ful VES trant) blocks the effects of estrogen. It is used to treat breast cancer in women past the age of menopause.  This medicine may be used for other purposes; ask your health care provider or pharmacist if you have questions.  COMMON BRAND NAME(S): FASLODEX  What should I tell my health care provider before I take this medicine?  They need to know if you have any of these conditions:  -bleeding problems  -liver disease  -low levels of platelets in the blood  -an unusual or allergic reaction to fulvestrant, other medicines, foods, dyes, or preservatives  -pregnant or trying to get pregnant  -breast-feeding  How should I use this medicine?  This medicine is for injection into a muscle. It is usually given by a health care professional in a hospital or clinic setting.  Talk to your pediatrician regarding the use of this medicine in children. Special care may be needed.  Overdosage: If you think you have taken too much of this medicine contact a poison control center or emergency room at once.  NOTE: This medicine is only for you. Do not share this medicine with others.  What if I miss a dose?  It is important not to miss your dose. Call your doctor or health care professional if you are unable to keep an appointment.  What may interact with this medicine?  -medicines that treat or prevent blood clots like warfarin, enoxaparin, and dalteparin  This list may not describe all possible interactions. Give your health care provider a list of all the medicines, herbs, non-prescription drugs, or dietary supplements you use. Also tell them if you smoke, drink alcohol, or use illegal drugs. Some items may interact with your medicine.  What should I watch for while using this medicine?  Your condition will be monitored carefully while you are receiving this medicine. You will need important blood work done while you are taking this medicine.  Do not become pregnant  while taking this medicine. Women should inform their doctor if they wish to become pregnant or think they might be pregnant. There is a potential for serious side effects to an unborn child. Talk to your health care professional or pharmacist for more information.  What side effects may I notice from receiving this medicine?  Side effects that you should report to your doctor or health care professional as soon as possible:  -allergic reactions like skin rash, itching or hives, swelling of the face, lips, or tongue  -feeling faint or lightheaded, falls  -fever or flu-like symptoms  -sore throat  -vaginal bleeding  Side effects that usually do not require medical attention (report to your doctor or health care professional if they continue or are bothersome):  -aches, pains  -constipation or diarrhea  -headache  -hot flashes  -nausea, vomiting  -pain at site where injected  -stomach pain  This list may not describe all possible side effects. Call your doctor for medical advice about side effects. You may report side effects to FDA at 1-800-FDA-1088.  Where should I keep my medicine?  This drug is given in a hospital or clinic and will not be stored at home.  NOTE: This sheet is a summary. It may not cover all possible information. If you have questions about this medicine, talk to your doctor, pharmacist, or health care provider.   2015, Elsevier/Gold Standard. (2007-09-02 15:39:24)

## 2016-10-10 NOTE — Patient Instructions (Signed)

## 2016-10-11 LAB — CANCER ANTIGEN 27.29: CA 27.29: 777.5 U/mL — ABNORMAL HIGH (ref 0.0–38.6)

## 2016-11-03 DIAGNOSIS — A084 Viral intestinal infection, unspecified: Secondary | ICD-10-CM | POA: Diagnosis not present

## 2016-11-03 DIAGNOSIS — R1110 Vomiting, unspecified: Secondary | ICD-10-CM | POA: Diagnosis not present

## 2016-11-06 DIAGNOSIS — R918 Other nonspecific abnormal finding of lung field: Secondary | ICD-10-CM | POA: Diagnosis not present

## 2016-11-06 DIAGNOSIS — Z720 Tobacco use: Secondary | ICD-10-CM | POA: Diagnosis not present

## 2016-11-06 DIAGNOSIS — E039 Hypothyroidism, unspecified: Secondary | ICD-10-CM | POA: Diagnosis not present

## 2016-11-06 DIAGNOSIS — G4700 Insomnia, unspecified: Secondary | ICD-10-CM | POA: Diagnosis not present

## 2016-11-06 DIAGNOSIS — C50919 Malignant neoplasm of unspecified site of unspecified female breast: Secondary | ICD-10-CM | POA: Diagnosis not present

## 2016-11-07 ENCOUNTER — Other Ambulatory Visit: Payer: Medicare Other

## 2016-11-07 ENCOUNTER — Ambulatory Visit: Payer: Medicare Other

## 2016-11-09 ENCOUNTER — Ambulatory Visit (HOSPITAL_COMMUNITY)
Admission: RE | Admit: 2016-11-09 | Discharge: 2016-11-09 | Disposition: A | Discharge status: Home / Self Care | Payer: Medicare Other | Source: Ambulatory Visit | Attending: Adult Health | Admitting: Adult Health

## 2016-11-09 ENCOUNTER — Ambulatory Visit (HOSPITAL_BASED_OUTPATIENT_CLINIC_OR_DEPARTMENT_OTHER): Payer: Medicare Other | Admitting: Adult Health

## 2016-11-09 ENCOUNTER — Other Ambulatory Visit (HOSPITAL_BASED_OUTPATIENT_CLINIC_OR_DEPARTMENT_OTHER): Payer: Medicare Other

## 2016-11-09 ENCOUNTER — Ambulatory Visit (HOSPITAL_BASED_OUTPATIENT_CLINIC_OR_DEPARTMENT_OTHER): Payer: Medicare Other

## 2016-11-09 VITALS — BP 132/72 | HR 71 | Temp 97.6°F | Resp 18 | Ht 63.0 in | Wt 113.8 lb

## 2016-11-09 DIAGNOSIS — C50811 Malignant neoplasm of overlapping sites of right female breast: Secondary | ICD-10-CM | POA: Diagnosis present

## 2016-11-09 DIAGNOSIS — C50912 Malignant neoplasm of unspecified site of left female breast: Secondary | ICD-10-CM

## 2016-11-09 DIAGNOSIS — I700 Atherosclerosis of aorta: Secondary | ICD-10-CM | POA: Diagnosis not present

## 2016-11-09 DIAGNOSIS — Z5111 Encounter for antineoplastic chemotherapy: Secondary | ICD-10-CM | POA: Diagnosis present

## 2016-11-09 DIAGNOSIS — J8410 Pulmonary fibrosis, unspecified: Secondary | ICD-10-CM | POA: Diagnosis not present

## 2016-11-09 DIAGNOSIS — C778 Secondary and unspecified malignant neoplasm of lymph nodes of multiple regions: Secondary | ICD-10-CM | POA: Diagnosis not present

## 2016-11-09 DIAGNOSIS — K449 Diaphragmatic hernia without obstruction or gangrene: Secondary | ICD-10-CM | POA: Diagnosis not present

## 2016-11-09 DIAGNOSIS — J90 Pleural effusion, not elsewhere classified: Secondary | ICD-10-CM | POA: Insufficient documentation

## 2016-11-09 DIAGNOSIS — Z170 Estrogen receptor positive status [ER+]: Secondary | ICD-10-CM

## 2016-11-09 DIAGNOSIS — C50911 Malignant neoplasm of unspecified site of right female breast: Secondary | ICD-10-CM | POA: Diagnosis not present

## 2016-11-09 DIAGNOSIS — J910 Malignant pleural effusion: Secondary | ICD-10-CM

## 2016-11-09 LAB — COMPREHENSIVE METABOLIC PANEL
ALK PHOS: 126 U/L (ref 40–150)
ALT: 11 U/L (ref 0–55)
ANION GAP: 10 meq/L (ref 3–11)
AST: 16 U/L (ref 5–34)
Albumin: 3.7 g/dL (ref 3.5–5.0)
BILIRUBIN TOTAL: 0.52 mg/dL (ref 0.20–1.20)
BUN: 11.4 mg/dL (ref 7.0–26.0)
CALCIUM: 9.4 mg/dL (ref 8.4–10.4)
CO2: 27 mEq/L (ref 22–29)
CREATININE: 0.8 mg/dL (ref 0.6–1.1)
Chloride: 96 mEq/L — ABNORMAL LOW (ref 98–109)
EGFR: 64 mL/min/{1.73_m2} — ABNORMAL LOW (ref >90)
Glucose: 88 mg/dl (ref 70–140)
Potassium: 4 mEq/L (ref 3.5–5.1)
Sodium: 133 mEq/L — ABNORMAL LOW (ref 136–145)
TOTAL PROTEIN: 7 g/dL (ref 6.4–8.3)

## 2016-11-09 LAB — CBC WITH DIFFERENTIAL/PLATELET
BASO%: 0.3 % (ref 0.0–2.0)
Basophils Absolute: 0 10*3/uL (ref 0.0–0.1)
EOS%: 5.4 % (ref 0.0–7.0)
Eosinophils Absolute: 0.4 10*3/uL (ref 0.0–0.5)
HCT: 35.5 % (ref 34.8–46.6)
HGB: 11.7 g/dL (ref 11.6–15.9)
LYMPH%: 33 % (ref 14.0–49.7)
MCH: 25.5 pg (ref 25.1–34.0)
MCHC: 33 g/dL (ref 31.5–36.0)
MCV: 77.5 fL — AB (ref 79.5–101.0)
MONO#: 0.4 10*3/uL (ref 0.1–0.9)
MONO%: 6 % (ref 0.0–14.0)
NEUT%: 55.3 % (ref 38.4–76.8)
NEUTROS ABS: 3.8 10*3/uL (ref 1.5–6.5)
PLATELETS: 204 10*3/uL (ref 145–400)
RBC: 4.58 10*6/uL (ref 3.70–5.45)
RDW: 16 % — ABNORMAL HIGH (ref 11.2–14.5)
WBC: 6.9 10*3/uL (ref 3.9–10.3)
lymph#: 2.3 10*3/uL (ref 0.9–3.3)

## 2016-11-09 MED ORDER — FULVESTRANT 250 MG/5ML IM SOLN
500.0000 mg | Freq: Once | INTRAMUSCULAR | Status: AC
  Administered 2016-11-09: 500 mg via INTRAMUSCULAR
  Filled 2016-11-09: qty 10

## 2016-11-09 NOTE — Patient Instructions (Signed)
Fulvestrant injection  What is this medicine?  FULVESTRANT (ful VES trant) blocks the effects of estrogen. It is used to treat breast cancer in women past the age of menopause.  This medicine may be used for other purposes; ask your health care provider or pharmacist if you have questions.  COMMON BRAND NAME(S): FASLODEX  What should I tell my health care provider before I take this medicine?  They need to know if you have any of these conditions:  -bleeding problems  -liver disease  -low levels of platelets in the blood  -an unusual or allergic reaction to fulvestrant, other medicines, foods, dyes, or preservatives  -pregnant or trying to get pregnant  -breast-feeding  How should I use this medicine?  This medicine is for injection into a muscle. It is usually given by a health care professional in a hospital or clinic setting.  Talk to your pediatrician regarding the use of this medicine in children. Special care may be needed.  Overdosage: If you think you have taken too much of this medicine contact a poison control center or emergency room at once.  NOTE: This medicine is only for you. Do not share this medicine with others.  What if I miss a dose?  It is important not to miss your dose. Call your doctor or health care professional if you are unable to keep an appointment.  What may interact with this medicine?  -medicines that treat or prevent blood clots like warfarin, enoxaparin, and dalteparin  This list may not describe all possible interactions. Give your health care provider a list of all the medicines, herbs, non-prescription drugs, or dietary supplements you use. Also tell them if you smoke, drink alcohol, or use illegal drugs. Some items may interact with your medicine.  What should I watch for while using this medicine?  Your condition will be monitored carefully while you are receiving this medicine. You will need important blood work done while you are taking this medicine.  Do not become pregnant  while taking this medicine. Women should inform their doctor if they wish to become pregnant or think they might be pregnant. There is a potential for serious side effects to an unborn child. Talk to your health care professional or pharmacist for more information.  What side effects may I notice from receiving this medicine?  Side effects that you should report to your doctor or health care professional as soon as possible:  -allergic reactions like skin rash, itching or hives, swelling of the face, lips, or tongue  -feeling faint or lightheaded, falls  -fever or flu-like symptoms  -sore throat  -vaginal bleeding  Side effects that usually do not require medical attention (report to your doctor or health care professional if they continue or are bothersome):  -aches, pains  -constipation or diarrhea  -headache  -hot flashes  -nausea, vomiting  -pain at site where injected  -stomach pain  This list may not describe all possible side effects. Call your doctor for medical advice about side effects. You may report side effects to FDA at 1-800-FDA-1088.  Where should I keep my medicine?  This drug is given in a hospital or clinic and will not be stored at home.  NOTE: This sheet is a summary. It may not cover all possible information. If you have questions about this medicine, talk to your doctor, pharmacist, or health care provider.   2015, Elsevier/Gold Standard. (2007-09-02 15:39:24)

## 2016-11-09 NOTE — Progress Notes (Signed)
[place]  Telephone:([phone] Fax:(336) [phone]     ID: Brianna Duke OB: 07/18/1927  MR#: [id number]  RKY#:[id number]  PCP: [name], MD GYN:   SU:  OTHER MD: [name], [name]  CHIEF COMPLAINT: Metastatic breast cancer   CURRENT TREATMENT: fulvestrant  BREAST CANCER HISTORY: From [name] intake note dated 11/05/2007:  "She presented to Dr. [name] back in February for a complete physical exam.  She has a history of breast cancer treated back in 1992 by Dr. [name].  She had had a left lumpectomy by Dr. [name] for a reasonably large lesion, although I do not have any of the detail and she also had lymph nodes involved at that time, which I believe, 5 of 15 nodes were involved.  Pathology department no longer has this information.  She did have external beam radiation therapy for this.  We are awaiting that information as well.  For her complete physical, he recommended screening mammogram.  This was performed March 3 and it showed a possible mass noted in the right breast.  Spot compression views and possibly ultrasound were recommended for further evaluation.  Left breast was unremarkable.  She came back for that on March 27 and this showed that the question small mass in the right breast at the 12 o'clock position persisted measuring 4 cm from the nipple, a 0.76 x 0.9 cm hypoechoic mass correlating to the mammographic findings.  They recommended further workup.  On March 30, she had a core biopsy and this showed invasive ductal carcinoma ER/PR positive, HER-2/neu negative.  She went on to have an MRI of the bilateral breasts on April 9 and it showed a 1.1 x 0.8 x 0.5 known invasive ductal carcinoma at the 12 o'clock position of the right breast with an adjacent 3 mm possible satellite lesion immediately adjacent to the posteroinferior aspect of this mass.  There were multiple additional right breast nodules 4 mm or less in maximum  diameter most likely representing normal background nodular parenchyma.  None of these were large enough to warrant a biopsy at the time.  There was no background parenchymal nodularity or enhancement in the left breast possibly due to previous radiation.  There was asymmetrical and mildly prominent right axillary and retropectoral lymph nodes and a second look ultrasound of the right axilla was recommended.  She was given an ultrasound appointment at 11 a.m. on April 10; however, she called in and canceled that appointment saying she does not want to reschedule.  She told me that she had gone up to [place] and was considering not having any kind of treatment at all.  She had multiple small liver masses also noted on MRI, which were too small to accurately characterize on the current images, moderate-sized hiatal hernia.  She then finally returned to town and agreed to have surgery and chest x-ray on May 21, showed slight hyperinflation but no acute process seen.  She went on to have her lumpectomy and sentinel node biopsy by [name] on May 27.  This showed a 1.1 cm invasive ductal carcinoma grade 1 with low-grade DCIS with the DCIS margin being less than 0.1 cm in the lateral superficial resection margins.  Two sentinel nodes were negative.  [name] discussed with her the need for radiation and has sent her here for our opinion regarding the role of radiation.  He also discussed with her hormone receptor status being positive, but the patient declined taking  any sort of medication or even having a medical oncology referral."  The patient did complete radiation to the right breast 12/14/2007, with a boost to a total dose of 6280 cGy . She was subsequently lost to followup.  More recently the patient noted left sided supraclavicular adenopathy. She brought this to the attention of Dr. [name] who obtained a chest CT 07/16/2013 at [facility]. This showed left supraclavicular and anterior mediastinal  adenopathy, a left-sided pleural effusion on with evidence of left-sided pleural disease, a left adrenal mass and probable retroperitoneal adenopathy, several splenic lesions, some right retrocrural adenopathy, nonspecific liver lesions and also evidence of aortic atherosclerosis and emphysema. On 07/21/2013 the patient underwent left thoracentesis, in the pleural fluid showed ([id number]) malignant cells consistent with breast adenocarcinoma, estrogen receptor 35% positive, progesterone receptor 22% positive, with no HER-2 amplification, the signals ratio being 1.40 and the number per cell 1.75.  Her subsequent history is as detailed below.  INTERVAL HISTORY: Brianna Duke is here today for follow up about her metastatic breast cancer.  She is doing well today.  She did have a GI virus earlier this week and had significant nausea and vomiting.  She says it happened after drinking ensure.  She is slowly recovering.  She is down 2 pounds again this week.  She continues to receive fulvestrant and is tolerating it well for the most part.    ROS: Brianna Duke denies any fevers, chills, nausea, vomiting, constipation, diarrhea, or any other issues, a detailed ros is otherwise non contributory.    PAST MEDICAL HISTORY: Past Medical History:  Diagnosis Date  . Anxiety   . Baker's cyst of knee    right knee  . Breast cancer ([place]) 05/31/00    left lower outer   . Bronchitis    history  . Cataract    surgery onboth  . Chronic kidney disease    stage 2  . COPD (chronic obstructive pulmonary disease) ([place])   . Depression   . History of radiation therapy 06/15/00- 08/22/00   left breast, 5940 cGy 33 fractions  . Hx of radiation therapy 11/18/07- 01/02/08   right breast 2880 cGy 16 fractions, right breast 2000 cGy 10 fractions, right breast boost 1400 cGy 7 fractions  . Hyperlipidemia   . Hypertension   . Insomnia   . Malignant neoplasm of breast (female), unspecified site 10/02/07   right 12 o'clock , invasive ductal   . Meniere's disease    surgery  . Metastases to the liver [facility]    breast primary  . Metastasis to adrenal gland ([place])   . Metastasis to spleen ([place])   . Pleural effusion, malignant 07/21/13   left lung, breast primary  . Retinal detachment    history of  . Skin cancer    squamous cell- face  . Thyroid disease    hypothyroid  . Varicose veins     PAST SURGICAL HISTORY: Past Surgical History:  Procedure Laterality Date  . APPENDECTOMY     83 yrs old  . PITUITARY SURGERY  2005   adenoma removed  . THYROIDECTOMY, PARTIAL  1991  . TONSILLECTOMY     age 45    FAMILY HISTORY Family History  Problem Relation Age of Onset  . Cancer Mother   . Cancer Sister        ovarian  . Cancer Sister 11       breast   the patient's father died at the age of 2 in a tractor accident.  The patient is one of 9 siblings. Only at 2 siblings survive in addition to her. One is her brother Brianna Duke who lives in [place], and sister Alisia Ferrari who lives in [place].Marland KitchenHe can be reached at [phone].   GYNECOLOGIC HISTORY:  Menarche age 26. The patient is GX P0. 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 She moved to [place] from [place] around that time. She is single. She lives alone, with no pets. She attends the good [employer]. In addition to her siblings, listed above, she frequently visits her niece Meryl Dare in [place]. She can be reached at [phone], and she is also close to her friend Renne Musca who lives in [place].  ADVANCED DIRECTIVES: Not in place; on her 09/04/2013 visit the patient was given healthcare power of attorney and living will documents to complete and notarize at her discretion; "the family should take care of all that" if something happens to her   HEALTH  MAINTENANCE: Social History  Substance Use Topics  . Smoking status: Current Every Day Smoker    Packs/day: 1.00    Years: 30.00    Types: Cigarettes  . Smokeless tobacco: Never Used  . Alcohol use Yes     Comment: rarely   Allergies  Allergen Reactions  . Tylenol [Acetaminophen] Hives    Current Outpatient Prescriptions  Medication Sig Dispense Refill  . levothyroxine (SYNTHROID, LEVOTHROID) 75 MCG tablet Take 75 mcg by mouth daily before breakfast.    . LORazepam (ATIVAN) 1 MG tablet TAKE 1 TABLET BY MOUTH TWICE A DY AS NEEDED FOR ANXIETY (Patient taking differently: TAKE 1 TABLET BY MOUTH EVERY NIGHT) 30 tablet 1  . Multiple Minerals (CALCIUM/MAGNESIUM/ZINC PO) Take 1 tablet by mouth every morning.    . naproxen sodium (ANAPROX) 220 MG tablet Take 220 mg by mouth 2 (two) times daily as needed.      No current facility-administered medications for this visit.     OBJECTIVE:  Vitals:   11/09/16 1038  BP: 132/72  Pulse: 71  Resp: 18  Temp: 97.6 F (36.4 C)     Body mass index is 20.16 kg/m.      ECOG FS:1 - Symptomatic but completely ambulatory GENERAL: Patient is a well appearing female in no acute distress HEENT:  Sclerae anicteric.  Oropharynx clear and moist. No ulcerations or evidence of oropharyngeal candidiasis. Neck is supple.  NODES:  No cervical, supraclavicular, or axillary lymphadenopathy palpated.  BREAST EXAM:  Deferred. LUNGS:  Diminished in left lower lobe, dullness to percussion in left lower lobe until about 2/3 of the way up. HEART:  Regular rate and rhythm. No murmur appreciated. ABDOMEN:  Soft, nontender.  Positive, normoactive bowel sounds. No organomegaly palpated. MSK:  No focal spinal tenderness to palpation. Full range of motion bilaterally in the upper extremities. EXTREMITIES:  No peripheral edema.   SKIN:  Clear with no obvious rashes or skin changes. No nail dyscrasia. NEURO:  Nonfocal. Well oriented.  Appropriate affect.     LAB  RESULTS:  CMP     Component Value Date/Time   NA 134 (L) 10/10/2016 1309   K 4.2 10/10/2016 1309   CL 104 09/26/2007 1206   CO2 23 10/10/2016 1309   GLUCOSE 74 10/10/2016 1309   BUN 12.5 10/10/2016 1309   CREATININE 0.9 10/10/2016 1309  CALCIUM 8.9 10/10/2016 1309   PROT 6.2 (L) 10/10/2016 1309   ALBUMIN 3.5 10/10/2016 1309   AST 15 10/10/2016 1309   ALT 9 10/10/2016 1309   ALKPHOS 124 10/10/2016 1309   BILITOT 0.43 10/10/2016 1309   GFRNONAA >60 09/26/2007 1206   GFRAA  09/26/2007 1206    >60        The eGFR has been calculated using the MDRD equation. This calculation has not been validated in all clinical   I No results found for: SPEP  Lab Results  Component Value Date   WBC 6.9 11/09/2016   NEUTROABS 3.8 11/09/2016   HGB 11.7 11/09/2016   HCT 35.5 11/09/2016   MCV 77.5 (L) 11/09/2016   PLT 204 11/09/2016      Chemistry      Component Value Date/Time   NA 134 (L) 10/10/2016 1309   K 4.2 10/10/2016 1309   CL 104 09/26/2007 1206   CO2 23 10/10/2016 1309   BUN 12.5 10/10/2016 1309   CREATININE 0.9 10/10/2016 1309      Component Value Date/Time   CALCIUM 8.9 10/10/2016 1309   ALKPHOS 124 10/10/2016 1309   AST 15 10/10/2016 1309   ALT 9 10/10/2016 1309   BILITOT 0.43 10/10/2016 1309       No results found for: LABCA2  No components found for: LABCA125  No results for input(s): INR in the last 168 hours.  Urinalysis No results found for: COLORURINE  STUDIES: Dg Chest 2 View  Result Date: 11/09/2016 CLINICAL DATA:  History of breast carcinoma with pleural effusion EXAM: CHEST  2 VIEW COMPARISON:  October 10, 2016 FINDINGS: There is persistent partially loculated left pleural effusion with left base consolidation. There is a calcified granuloma in the right middle lobe. No new opacity is evident. Heart size and pulmonary vascularity normal. No adenopathy. There is aortic atherosclerosis. There is evidence of previous mastectomy on the left with surgical  clips in left axillary region. There is a focal hiatal hernia. IMPRESSION: Moderate persistent left pleural effusion, partially loculated, with associated left base consolidation, likely due to atelectasis. Appearance essentially stable compared to 1 month prior. Calcified granuloma right middle lobe. No evident new opacity. Stable cardiac silhouette. There is aortic atherosclerosis. Moderate hiatal hernia. Aortic Atherosclerosis (ICD10-I70.0). Electronically Signed   By: Lowella Grip III M.D.   On: 11/09/2016 10:13   Dg Chest 2 View  Result Date: 10/10/2016 CLINICAL DATA:  Breast malignancy, follow-up left pleural effusion. EXAM: CHEST  2 VIEW COMPARISON:  Chest x-ray of Sep 08, 2016 FINDINGS: There remains a moderate sized left pleural effusion. There is a stable nodule in the right middle lobe. There is no pulmonary vascular congestion. The heart is normal in size. There is calcification in the wall of the aortic arch. There surgical clips in the left axillary region. The patient has undergone previous surgery on the left breast. IMPRESSION: Fairly stable left pleural effusion occupying between one third to one half of the pleural space volume. Stable right middle lobe lung nodule. Thoracic aortic atherosclerosis. Electronically Signed   By: David  Martinique M.D.   On: 10/10/2016 16:11    ASSESSMENT: 83 y.o. [place] woman with a history of bilateral breast cancer, now stage IV, as follows  (1) LEFT BREAST:  (a) status post left excisional biopsy 05/14/2000 for a pT2 NX, stage II invasive lobular breast cancer, estrogen receptor 83% positive, progesterone receptor 90% positive, HER-2 negative, with positive margins   (b) left breast reexcision  for margin clearance, with left axillary lymph node dissection 05/31/2000, with negative margins but 5/15 axillary lymph nodes involved, final stage pT2 pN2, stage IIIA  (c) status post radiation to the left breast (not to axilla)  (d) on tamoxifen  approximately 5 years per patient report  (2) RIGHT BREAST:  (a) status post right lumpectomy and sentinel lymph node sampling 05/272009 for a pT1c pN0, stage IA invasive ductal carcinoma, grade 1, estrogen receptor 99% positive, progesterone receptor and 98% positive, with an MIB-1 of 11%, and no HER-2 amplification.  (b) status post radiation to the right breast completed August 2009  (c) the patient refused antiestrogen therapy and medical oncology evaluation  (3) METASTATIC DISEASE:  (a) staging studies March and April 2015 show extensive metastatic adenopathy (left supraclavicular and left axillary,. retrocrural, etc.), with splenic involvement, and with significant left pleural involvement and a large left pleural effusion; bone involvement was questionable   (b) left thoracentesis 07/21/2013 confirms a malignant effusion, with adenocarcinoma cells positive for estrogen and progesterone receptor, negative for HER-2 amplification  (c) fulvestrant started 09/04/2013, discontinued at patient's request after 05/28/2014 dose despite continuing response  (d) anastrozole started 07/01/2014, stopped after 1 month  (d) switched back to fulvestrant starting 10/08/2014--lost to follow up after 03/04/2015  (e) Resumed Fulvestrant on 09/08/2016  (4) continuing tobacco abuse  PLAN: Brianna Duke is doing well today.  She will proceed with Fulvestrant today and she continues to tolerate this well.  Her chest xray is stable.  I continued to recommend increased PO intake.  I reviewed her xray with her.  She will return in 4 weeks for labs, injection, and xray, and in 8 weeks for labs, CT chest, and follow up with Dr. Jana Hakim.  I reviewed this with them in detail and they verbalized understanding.    A total of (30) minutes of face-to-face time was spent with this patient with greater than 50% of that time in counseling and care-coordination.   Scot Dock, NP 11/09/2016 10:45 AM

## 2016-11-10 LAB — CANCER ANTIGEN 27.29: CA 27.29: 784 U/mL — ABNORMAL HIGH (ref 0.0–38.6)

## 2016-12-05 ENCOUNTER — Ambulatory Visit: Payer: Medicare Other

## 2016-12-05 ENCOUNTER — Other Ambulatory Visit: Payer: Medicare Other

## 2016-12-25 ENCOUNTER — Telehealth (HOSPITAL_BASED_OUTPATIENT_CLINIC_OR_DEPARTMENT_OTHER): Payer: Medicare Other | Admitting: *Deleted

## 2016-12-25 ENCOUNTER — Other Ambulatory Visit: Payer: Medicare Other

## 2016-12-25 DIAGNOSIS — C50911 Malignant neoplasm of unspecified site of right female breast: Secondary | ICD-10-CM

## 2016-12-25 NOTE — Telephone Encounter (Signed)
Lab order placed as directed below. Patient notified of lab requested and scheduled for appt at 1200. Patient confirms appt.

## 2016-12-25 NOTE — Telephone Encounter (Signed)
-----   Message from Gardenia Phlegm, NP sent at 12/25/2016 10:50 AM EDT ----- Will you order stat bmp in system and schedule lab prior to her ct tomorrow?  Thanks,  [place]

## 2016-12-26 ENCOUNTER — Other Ambulatory Visit: Payer: Self-pay

## 2016-12-26 ENCOUNTER — Ambulatory Visit: Payer: Medicare Other | Admitting: Oncology

## 2016-12-26 ENCOUNTER — Other Ambulatory Visit: Payer: Medicare Other

## 2016-12-26 ENCOUNTER — Ambulatory Visit: Payer: Medicare Other

## 2016-12-26 ENCOUNTER — Ambulatory Visit (HOSPITAL_COMMUNITY)
Admission: RE | Admit: 2016-12-26 | Discharge: 2016-12-26 | Disposition: A | Discharge status: Home / Self Care | Payer: Medicare Other | Source: Ambulatory Visit | Attending: Adult Health | Admitting: Adult Health

## 2016-12-26 ENCOUNTER — Encounter (HOSPITAL_COMMUNITY): Payer: Self-pay

## 2016-12-26 DIAGNOSIS — C50911 Malignant neoplasm of unspecified site of right female breast: Secondary | ICD-10-CM | POA: Diagnosis not present

## 2016-12-26 DIAGNOSIS — I2510 Atherosclerotic heart disease of native coronary artery without angina pectoris: Secondary | ICD-10-CM | POA: Insufficient documentation

## 2016-12-26 DIAGNOSIS — I700 Atherosclerosis of aorta: Secondary | ICD-10-CM | POA: Diagnosis not present

## 2016-12-26 DIAGNOSIS — K449 Diaphragmatic hernia without obstruction or gangrene: Secondary | ICD-10-CM | POA: Insufficient documentation

## 2016-12-26 DIAGNOSIS — Z170 Estrogen receptor positive status [ER+]: Secondary | ICD-10-CM

## 2016-12-26 DIAGNOSIS — R591 Generalized enlarged lymph nodes: Secondary | ICD-10-CM | POA: Insufficient documentation

## 2016-12-26 DIAGNOSIS — C50811 Malignant neoplasm of overlapping sites of right female breast: Secondary | ICD-10-CM

## 2016-12-26 DIAGNOSIS — C50912 Malignant neoplasm of unspecified site of left female breast: Secondary | ICD-10-CM

## 2016-12-26 DIAGNOSIS — R918 Other nonspecific abnormal finding of lung field: Secondary | ICD-10-CM | POA: Insufficient documentation

## 2016-12-26 DIAGNOSIS — R921 Mammographic calcification found on diagnostic imaging of breast: Secondary | ICD-10-CM | POA: Diagnosis not present

## 2016-12-26 DIAGNOSIS — J90 Pleural effusion, not elsewhere classified: Secondary | ICD-10-CM | POA: Insufficient documentation

## 2016-12-26 LAB — COMPREHENSIVE METABOLIC PANEL
ALT: 7 U/L (ref 0–55)
ANION GAP: 8 meq/L (ref 3–11)
AST: 12 U/L (ref 5–34)
Albumin: 3.2 g/dL — ABNORMAL LOW (ref 3.5–5.0)
Alkaline Phosphatase: 124 U/L (ref 40–150)
BUN: 15.2 mg/dL (ref 7.0–26.0)
CALCIUM: 9.3 mg/dL (ref 8.4–10.4)
CHLORIDE: 102 meq/L (ref 98–109)
CO2: 26 mEq/L (ref 22–29)
CREATININE: 1 mg/dL (ref 0.6–1.1)
EGFR: 49 mL/min/{1.73_m2} — AB (ref >90)
Glucose: 81 mg/dl (ref 70–140)
POTASSIUM: 4.5 meq/L (ref 3.5–5.1)
Sodium: 136 mEq/L (ref 136–145)
Total Bilirubin: 0.28 mg/dL (ref 0.20–1.20)
Total Protein: 6.5 g/dL (ref 6.4–8.3)

## 2016-12-26 LAB — CBC WITH DIFFERENTIAL/PLATELET
BASO%: 0.4 % (ref 0.0–2.0)
BASOS ABS: 0 10*3/uL (ref 0.0–0.1)
EOS%: 2.6 % (ref 0.0–7.0)
Eosinophils Absolute: 0.2 10*3/uL (ref 0.0–0.5)
HEMATOCRIT: 33.4 % — AB (ref 34.8–46.6)
HGB: 11 g/dL — ABNORMAL LOW (ref 11.6–15.9)
LYMPH#: 2.1 10*3/uL (ref 0.9–3.3)
LYMPH%: 31.6 % (ref 14.0–49.7)
MCH: 25.5 pg (ref 25.1–34.0)
MCHC: 33 g/dL (ref 31.5–36.0)
MCV: 77.4 fL — AB (ref 79.5–101.0)
MONO#: 0.6 10*3/uL (ref 0.1–0.9)
MONO%: 8.4 % (ref 0.0–14.0)
NEUT#: 3.8 10*3/uL (ref 1.5–6.5)
NEUT%: 57 % (ref 38.4–76.8)
PLATELETS: 237 10*3/uL (ref 145–400)
RBC: 4.31 10*6/uL (ref 3.70–5.45)
RDW: 17.2 % — ABNORMAL HIGH (ref 11.2–14.5)
WBC: 6.6 10*3/uL (ref 3.9–10.3)

## 2016-12-26 MED ORDER — IOPAMIDOL (ISOVUE-300) INJECTION 61%
INTRAVENOUS | Status: AC
  Filled 2016-12-26: qty 100

## 2016-12-26 MED ORDER — IOPAMIDOL (ISOVUE-300) INJECTION 61%
INTRAVENOUS | Status: AC
  Filled 2016-12-26: qty 75

## 2016-12-26 MED ORDER — IOPAMIDOL (ISOVUE-300) INJECTION 61%: 75.0000 mL | Freq: Once | INTRAVENOUS | Status: DC | PRN

## 2016-12-27 LAB — CANCER ANTIGEN 27.29: CA 27.29: 686 U/mL — ABNORMAL HIGH (ref 0.0–38.6)

## 2017-01-02 ENCOUNTER — Other Ambulatory Visit: Payer: Medicare Other

## 2017-01-02 ENCOUNTER — Ambulatory Visit: Payer: Medicare Other

## 2017-01-02 ENCOUNTER — Ambulatory Visit: Payer: Medicare Other | Admitting: Oncology

## 2017-01-15 DIAGNOSIS — Z720 Tobacco use: Secondary | ICD-10-CM | POA: Diagnosis not present

## 2017-01-15 DIAGNOSIS — M810 Age-related osteoporosis without current pathological fracture: Secondary | ICD-10-CM | POA: Diagnosis not present

## 2017-01-15 DIAGNOSIS — C50919 Malignant neoplasm of unspecified site of unspecified female breast: Secondary | ICD-10-CM | POA: Diagnosis not present

## 2017-01-15 DIAGNOSIS — E039 Hypothyroidism, unspecified: Secondary | ICD-10-CM | POA: Diagnosis not present

## 2017-01-15 DIAGNOSIS — I700 Atherosclerosis of aorta: Secondary | ICD-10-CM | POA: Diagnosis not present

## 2017-01-23 ENCOUNTER — Other Ambulatory Visit (HOSPITAL_BASED_OUTPATIENT_CLINIC_OR_DEPARTMENT_OTHER): Payer: Medicare Other

## 2017-01-23 ENCOUNTER — Ambulatory Visit (HOSPITAL_BASED_OUTPATIENT_CLINIC_OR_DEPARTMENT_OTHER): Payer: Medicare Other | Admitting: Oncology

## 2017-01-23 ENCOUNTER — Ambulatory Visit: Payer: Medicare Other

## 2017-01-23 DIAGNOSIS — C50812 Malignant neoplasm of overlapping sites of left female breast: Secondary | ICD-10-CM | POA: Insufficient documentation

## 2017-01-23 DIAGNOSIS — F17200 Nicotine dependence, unspecified, uncomplicated: Secondary | ICD-10-CM

## 2017-01-23 DIAGNOSIS — N182 Chronic kidney disease, stage 2 (mild): Secondary | ICD-10-CM | POA: Diagnosis not present

## 2017-01-23 DIAGNOSIS — C50911 Malignant neoplasm of unspecified site of right female breast: Secondary | ICD-10-CM | POA: Diagnosis not present

## 2017-01-23 DIAGNOSIS — C50912 Malignant neoplasm of unspecified site of left female breast: Secondary | ICD-10-CM

## 2017-01-23 DIAGNOSIS — J90 Pleural effusion, not elsewhere classified: Secondary | ICD-10-CM | POA: Diagnosis not present

## 2017-01-23 DIAGNOSIS — Z170 Estrogen receptor positive status [ER+]: Secondary | ICD-10-CM | POA: Diagnosis not present

## 2017-01-23 DIAGNOSIS — C50811 Malignant neoplasm of overlapping sites of right female breast: Secondary | ICD-10-CM | POA: Diagnosis not present

## 2017-01-23 LAB — COMPREHENSIVE METABOLIC PANEL
ALBUMIN: 3.4 g/dL — AB (ref 3.5–5.0)
ALK PHOS: 122 U/L (ref 40–150)
ALT: 10 U/L (ref 0–55)
ANION GAP: 10 meq/L (ref 3–11)
AST: 13 U/L (ref 5–34)
BILIRUBIN TOTAL: 0.41 mg/dL (ref 0.20–1.20)
BUN: 15.2 mg/dL (ref 7.0–26.0)
CO2: 25 meq/L (ref 22–29)
Calcium: 9.3 mg/dL (ref 8.4–10.4)
Chloride: 97 mEq/L — ABNORMAL LOW (ref 98–109)
Creatinine: 0.9 mg/dL (ref 0.6–1.1)
EGFR: 58 mL/min/{1.73_m2} — AB (ref >90)
GLUCOSE: 83 mg/dL (ref 70–140)
POTASSIUM: 4.5 meq/L (ref 3.5–5.1)
SODIUM: 132 meq/L — AB (ref 136–145)
TOTAL PROTEIN: 6.9 g/dL (ref 6.4–8.3)

## 2017-01-23 LAB — CBC WITH DIFFERENTIAL/PLATELET
BASO%: 0.3 % (ref 0.0–2.0)
Basophils Absolute: 0 10*3/uL (ref 0.0–0.1)
EOS%: 3 % (ref 0.0–7.0)
Eosinophils Absolute: 0.2 10*3/uL (ref 0.0–0.5)
HCT: 33 % — ABNORMAL LOW (ref 34.8–46.6)
HGB: 10.8 g/dL — ABNORMAL LOW (ref 11.6–15.9)
LYMPH%: 29.9 % (ref 14.0–49.7)
MCH: 25.6 pg (ref 25.1–34.0)
MCHC: 32.7 g/dL (ref 31.5–36.0)
MCV: 78.2 fL — ABNORMAL LOW (ref 79.5–101.0)
MONO#: 0.5 10*3/uL (ref 0.1–0.9)
MONO%: 7.4 % (ref 0.0–14.0)
NEUT#: 4.2 10*3/uL (ref 1.5–6.5)
NEUT%: 59.4 % (ref 38.4–76.8)
Platelets: 179 10*3/uL (ref 145–400)
RBC: 4.22 10*6/uL (ref 3.70–5.45)
RDW: 16.4 % — ABNORMAL HIGH (ref 11.2–14.5)
WBC: 7 10*3/uL (ref 3.9–10.3)
lymph#: 2.1 10*3/uL (ref 0.9–3.3)

## 2017-01-23 MED ORDER — FULVESTRANT 250 MG/5ML IM SOLN
500.0000 mg | Freq: Once | INTRAMUSCULAR | Status: DC
  Filled 2017-01-23: qty 10

## 2017-01-23 MED ORDER — LETROZOLE 2.5 MG PO TABS: 2.5000 mg | ORAL_TABLET | Freq: Every day | ORAL | 4 refills | Status: AC

## 2017-01-23 NOTE — Progress Notes (Signed)
[place]  Telephone:([phone] [phone]) [phone]     ID: [place] OB: [birth year]  MR#: [id number]  HUD#:[id number]  PCP: [name], MD GYN:   SU:  OTHER MD: [name], [name]  CHIEF COMPLAINT: Estrogen receptor positive metastatic breast cancer   CURRENT TREATMENT: Letrozole  BREAST CANCER HISTORY: From [name] intake note dated 11/05/2007:  "She presented to Dr. [name] back in February for a complete physical exam.  She has a history of breast cancer treated back in 1992 by Dr. [name].  She had had a left lumpectomy by Dr. [name] for a reasonably large lesion, although I do not have any of the detail and she also had lymph nodes involved at that time, which I believe, 5 of 15 nodes were involved.  Pathology department no longer has this information.  She did have external beam radiation therapy for this.  We are awaiting that information as well.  For her complete physical, he recommended screening mammogram.  This was performed March 3 and it showed a possible mass noted in the right breast.  Spot compression views and possibly ultrasound were recommended for further evaluation.  Left breast was unremarkable.  She came back for that on March 27 and this showed that the question small mass in the right breast at the 12 o'clock position persisted measuring 4 cm from the nipple, a 0.76 x 0.9 cm hypoechoic mass correlating to the mammographic findings.  They recommended further workup.  On March 30, she had a core biopsy and this showed invasive ductal carcinoma ER/PR positive, HER-2/neu negative.  She went on to have an MRI of the bilateral breasts on April 9 and it showed a 1.1 x 0.8 x 0.5 known invasive ductal carcinoma at the 12 o'clock position of the right breast with an adjacent 3 mm possible satellite lesion immediately adjacent to the posteroinferior aspect of this mass.  There were multiple additional right breast nodules  4 mm or less in maximum diameter most likely representing normal background nodular parenchyma.  None of these were large enough to warrant a biopsy at the time.  There was no background parenchymal nodularity or enhancement in the left breast possibly due to previous radiation.  There was asymmetrical and mildly prominent right axillary and retropectoral lymph nodes and a second look ultrasound of the right axilla was recommended.  She was given an ultrasound appointment at 11 a.m. on April 10; however, she called in and canceled that appointment saying she does not want to reschedule.  She told me that she had gone up to [place] and was considering not having any kind of treatment at all.  She had multiple small liver masses also noted on MRI, which were too small to accurately characterize on the current images, moderate-sized hiatal hernia.  She then finally returned to town and agreed to have surgery and chest x-ray on May 21, showed slight hyperinflation but no acute process seen.  She went on to have her lumpectomy and sentinel node biopsy by [name] on May 27.  This showed a 1.1 cm invasive ductal carcinoma grade 1 with low-grade DCIS with the DCIS margin being less than 0.1 cm in the lateral superficial resection margins.  Two sentinel nodes were negative.  [name] discussed with her the need for radiation and has sent her here for our opinion regarding the role of radiation.  He also discussed with her hormone receptor status being positive, but the  patient declined taking any sort of medication or even having a medical oncology referral."  The patient did complete radiation to the right breast 12/14/2007, with a boost to a total dose of 6280 cGy . She was subsequently lost to followup.  More recently the patient noted left sided supraclavicular adenopathy. She brought this to the attention of Dr. [name] who obtained a chest CT 07/16/2013 at [facility]. This showed left supraclavicular  and anterior mediastinal adenopathy, a left-sided pleural effusion on with evidence of left-sided pleural disease, a left adrenal mass and probable retroperitoneal adenopathy, several splenic lesions, some right retrocrural adenopathy, nonspecific liver lesions and also evidence of aortic atherosclerosis and emphysema. On 07/21/2013 the patient underwent left thoracentesis, in the pleural fluid showed ([id number]) malignant cells consistent with breast adenocarcinoma, estrogen receptor 35% positive, progesterone receptor 22% positive, with no HER-2 amplification, the signals ratio being 1.40 and the number per cell 1.75.  Her subsequent history is as detailed below.  INTERVAL HISTORY: Brianna Duke returns today for follow-up and treatment of her estrogen receptor positive stage IV breast cancer. She is being treated with fulvestrant, 500 mg every 28 days. Pt states that her last fulvestrant injection was in July. She reports that she is feeling better since being off of fulvestrant. She tells me somebody said to her that this shots are good for cancer in some places but not in the lung. In addition getting here is very difficult for her. She says she has called the front but people lose her phone call. That is certainly possible. We have been having problems in the front office for sometime and we are continuing to work on that. Pt states that after receiving fulvestrant, she has episodes of laryngitis. She notes that she has a PCP, Dr. Vladimir Crofts in [place], [place]. however she does not have an oncologist at this point in [place]. She prefers to keep coming here for her oncologic treatment   ROS: Davonna reports that she still smokes 0.5-1 PPD of cigarettes and reports that that she is trying to quit smoking cigarettes. She notes that her HTN medications aided with her breathing issues. She is not on an inhaler. She denies unusual headaches, visual changes, nausea, vomiting, or dizziness. There has been no unusual cough,  phlegm production, or pleurisy. This been no change in bowel or bladder habits. She denies unexplained fatigue or unexplained weight loss, bleeding, rash, or fever. A detailed review of systems was otherwise negative.    PAST MEDICAL HISTORY: Past Medical History:  Diagnosis Date  . Anxiety   . Baker's cyst of knee    right knee  . Breast cancer ([place]) 05/31/00    left lower outer   . Bronchitis    history  . Cataract    surgery onboth  . Chronic kidney disease    stage 2  . COPD (chronic obstructive pulmonary disease) ([place])   . Depression   . History of radiation therapy 06/15/00- 08/22/00   left breast, 5940 cGy 33 fractions  . Hx of radiation therapy 11/18/07- 01/02/08   right breast 2880 cGy 16 fractions, right breast 2000 cGy 10 fractions, right breast boost 1400 cGy 7 fractions  . Hyperlipidemia   . Hypertension   . Insomnia   . Malignant neoplasm of breast (female), unspecified site 10/02/07   right 12 o'clock , invasive ductal  . Meniere's disease    surgery  . Metastases to the liver [facility])    breast primary  . Metastasis to  adrenal gland ([place])   . 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 Pleural effusion, malignant 07/21/13   left lung, breast primary  . Retinal detachment    history of  . Skin cancer    squamous cell- face  . Thyroid disease    hypothyroid  . Varicose veins     PAST SURGICAL HISTORY: Past Surgical History:  Procedure Laterality Date  . APPENDECTOMY     83 yrs old  . PITUITARY SURGERY  2005   adenoma removed  . THYROIDECTOMY, PARTIAL  1991  . TONSILLECTOMY     age 49    FAMILY HISTORY Family History  Problem Relation Age of Onset  . Cancer Mother   . Cancer Sister        ovarian  . Cancer Sister 8       breast   the patient's father died at the age of 52 in a tractor accident. The patient is one of 9 siblings. Only at 2 siblings survive in addition to her. One is her brother Carmin Muskrat who lives in [place], and sister Alisia Ferrari who lives in [place].Marland KitchenHe can be reached at [phone].   GYNECOLOGIC HISTORY:  Menarche age 74. The patient is GX P0. 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 She moved to [place] from [place] around that time. She is single. She lives alone, with no pets. She attends the good [employer]. In addition to her siblings, listed above, she frequently visits her niece Meryl Dare in [place]. She can be reached at [phone], and she is also close to her friend Renne Musca who lives in [place].  ADVANCED DIRECTIVES: Not in place; on her 09/04/2013 visit the patient was given healthcare power of attorney and living will documents to complete and notarize at her discretion; "the family should take care of all that" if something happens to her   HEALTH MAINTENANCE: Social History  Substance Use Topics  . Smoking status: Current Every Day Smoker    Packs/day: 1.00    Years: 30.00    Types: Cigarettes  . Smokeless tobacco: Never Used  . Alcohol use Yes     Comment: rarely   Allergies  Allergen Reactions  . Tylenol [Acetaminophen] Hives  . Benzocaine Rash    rash    Current Outpatient Prescriptions  Medication Sig Dispense Refill  . labetalol (NORMODYNE) 100 MG tablet Take by mouth.    . levothyroxine (SYNTHROID, LEVOTHROID) 75 MCG tablet Take 75 mcg by mouth daily before breakfast.    . LORazepam (ATIVAN) 1 MG tablet TAKE 1 TABLET BY MOUTH TWICE A DY AS NEEDED FOR ANXIETY (Patient taking differently: TAKE 1 TABLET BY MOUTH EVERY NIGHT) 30 tablet 1  . naproxen sodium (ANAPROX) 220 MG tablet Take 220 mg by mouth 2 (two) times daily as needed.     . ondansetron (ZOFRAN) 4 MG tablet Take by mouth.     No current facility-administered medications for this visit.    Facility-Administered  Medications Ordered in Other Visits  Medication Dose Route Frequency Provider Last Rate Last Dose  . fulvestrant (FASLODEX) injection 500 mg  500 mg Intramuscular Once Magrinat, [name] Dad, MD        OBJECTIVE: Older white womanIn no acute distress age 3:   01/23/17 1329  BP: 122/68  Pulse: 61  Resp: 18  Temp: 97.9 F (36.6 C)  SpO2: 97%     Body mass index is 19.73 kg/m.      ECOG FS:1 - Symptomatic but completely ambulatory  Sclerae unicteric, pupils round and equal Oropharynx clear and moist No cervical or supraclavicular adenopathy Lungs decreased breath sounds throughout the left lung, but no rales or wheezes Heart regular rate and rhythm Abd soft, nontender, positive bowel sounds MSK no focal spinal tenderness, no upper extremity lymphedema Neuro: nonfocal, well oriented, appropriate affect Breasts: I do not palpate any suspicious finding in either breast or either axilla  LAB RESULTS:  CMP     Component Value Date/Time   NA 132 (L) 01/23/2017 1225   K 4.5 01/23/2017 1225   CL 104 09/26/2007 1206   CO2 25 01/23/2017 1225   GLUCOSE 83 01/23/2017 1225   BUN 15.2 01/23/2017 1225   CREATININE 0.9 01/23/2017 1225   CALCIUM 9.3 01/23/2017 1225   PROT 6.9 01/23/2017 1225   ALBUMIN 3.4 (L) 01/23/2017 1225   AST 13 01/23/2017 1225   ALT 10 01/23/2017 1225   ALKPHOS 122 01/23/2017 1225   BILITOT 0.41 01/23/2017 1225   GFRNONAA >60 09/26/2007 1206   GFRAA  09/26/2007 1206    >60        The eGFR has been calculated using the MDRD equation. This calculation has not been validated in all clinical   I No results found for: SPEP  Lab Results  Component Value Date   WBC 7.0 01/23/2017   NEUTROABS 4.2 01/23/2017   HGB 10.8 (L) 01/23/2017   HCT 33.0 (L) 01/23/2017   MCV 78.2 (L) 01/23/2017   PLT 179 01/23/2017      Chemistry      Component Value Date/Time   NA 132 (L) 01/23/2017 1225   K 4.5 01/23/2017 1225   CL 104 09/26/2007 1206   CO2 25 01/23/2017  1225   BUN 15.2 01/23/2017 1225   CREATININE 0.9 01/23/2017 1225      Component Value Date/Time   CALCIUM 9.3 01/23/2017 1225   ALKPHOS 122 01/23/2017 1225   AST 13 01/23/2017 1225   ALT 10 01/23/2017 1225   BILITOT 0.41 01/23/2017 1225       No results found for: LABCA2  No components found for: [id number]  No results for input(s): INR in the last 168 hours.  Urinalysis No results found for: COLORURINE  STUDIES: Ct Chest W Contrast  Result Date: 12/26/2016 CLINICAL DATA:  History of treated bilateral breast cancer. Left pleural effusion. EXAM: CT CHEST WITH CONTRAST TECHNIQUE: Multidetector CT imaging of the chest was performed during intravenous contrast administration. CONTRAST:  75 cc Isovue 300 intravenously. COMPARISON:  Chest radiograph 11/09/2016 FINDINGS: Cardiovascular: The heart size is normal. There is no pericardial effusion. There is calcific atherosclerotic disease of the coronary arteries. No evidence of central pulmonary embolus. Advanced atherosclerotic disease of the thoracic aorta with calcified and noncalcified plaque. Penetrating atherosclerotic ulcer in the posterior wall of the distal thoracic aorta, image 91/ 154, series 2, and at the level of the proximal abdominal aorta, image 124/154, sequence 2 and 69/130, sequence 6. Fusiform dilation of the descending aorta measuring 3.1 cm proximally, 2.7 cm distally. The aneurysmal dilation worsens at the level of the proximal abdominal aorta with the lumen measures up to 3.3 cm. Mediastinum/Nodes: Right mediastinal lymphadenopathy with pretracheal node measuring 1.5 cm in short axis. Right hilum lymph nodal conglomerate measuring 2.5 cm. Large hiatal hernia. Lungs/Pleura: Left loculated pleural  effusion with hyper enhancement of the pleura. Airspace consolidation in the dependent portion of the left lower lobe with rounded appearance. Upper Abdomen: 1.7 cm indeterminate hypoattenuated lesion in the right lobe of the liver.  Ill-defined masslike thickening in porta hepaticus with apparent interruption of the proximal main portal vein. Musculoskeletal: No acute or significant osseous findings. Postsurgical changes with coarse calcifications in bilateral breasts. IMPRESSION: Loculated left pleural effusion with hyper enhancement of the left pleura. This may represent metastatic pleural effusion or empyema. Rounded airspace consolidation in the dependent portion of the left lower lobe may represent round atelectasis, infectious consolidation or potentially a pulmonary mass. Right mediastinal and hilar lymphadenopathy. Heavy atherosclerotic disease of the aorta with calcified and noncalcified plaque and 2 penetrating atherosclerotic ulcers. Consultation with a vascular surgeon is recommended. Fusiform aneurysmal dilation of the descending aorta and proximal abdominal aorta, measuring up to 3.3 cm. Recommend followup by ultrasound in 3 years. This recommendation follows ACR consensus guidelines: White Paper of the ACR Incidental Findings Committee II on Vascular Findings. J Am Coll Radiol 2013; 10:789-794 Ill-defined masslike thickening in the porta hepaticus with apparent interruption of the proximal main portal vein, incompletely visualized. Malignancy cannot be excluded. 1.7 cm indeterminate hypoattenuated lesion within the right lobe of the liver. Aortic Atherosclerosis (ICD10-I70.0). Electronically Signed   By: Fidela Salisbury M.D.   On: 12/26/2016 17:21    ASSESSMENT: 83 y.o. [place] woman with a history of bilateral breast cancer, now stage IV, as follows  (1) LEFT BREAST:  (a) status post left excisional biopsy 05/14/2000 for a pT2 NX, stage II invasive lobular breast cancer, estrogen receptor 83% positive, progesterone receptor 90% positive, HER-2 negative, with positive margins   (b) left breast reexcision for margin clearance, with left axillary lymph node dissection 05/31/2000, with negative margins but 5/15  axillary lymph nodes involved, final stage pT2 pN2, stage IIIA  (c) status post radiation to the left breast (not to axilla)  (d) on tamoxifen approximately 5 years per patient report  (2) RIGHT BREAST:  (a) status post right lumpectomy and sentinel lymph node sampling 05/272009 for a pT1c pN0, stage IA invasive ductal carcinoma, grade 1, estrogen receptor 99% positive, progesterone receptor and 98% positive, with an MIB-1 of 11%, and no HER-2 amplification.  (b) status post radiation to the right breast completed August 2009  (c) the patient refused antiestrogen therapy and medical oncology evaluation  (3) METASTATIC DISEASE:  (a) staging studies March and April 2015 show extensive metastatic adenopathy (left supraclavicular and left axillary,. retrocrural, etc.), with splenic involvement, and with significant left pleural involvement and a large left pleural effusion; bone involvement was questionable   (b) left thoracentesis 07/21/2013 confirms a malignant effusion, with adenocarcinoma cells positive for estrogen and progesterone receptor, negative for HER-2 amplification  (c) fulvestrant started 09/04/2013, discontinued at patient's request after 05/28/2014 dose despite continuing response  (d) anastrozole started 07/01/2014, stopped after 1 month  (d) switched back to fulvestrant starting 10/08/2014, discontinued after July dose secondary to patient noncompliance  (e) started on letrozole 01/23/2017  (4) continuing tobacco abuse  PLAN: Shernell's situation is complex. She has had metastatic disease now for 3-1/2 years. She only takes treatments very intermittently. We had put her on fulvestrant precisely because we would be able to monitor whether she gets treated or not and what we discovered is that she has had no treatment now for about 2 months.  Since she is not being compliant with the fulvestrant there is really no 0.2  continuing that. We can instead switch to a pill she can take daily.  If she takes that it will work just as well as the fulvestrant. It has the advantage that she does not have to travel here every month to receive it.  I gave her a copy of her current CT scan and her 2015 CT scan. Unfortunately the radiologist did not do a comparison but I have requested that. She clearly has significant comorbidities including significant atherosclerotic cardiovascular disease. Her left lung currently is essentially nonfunctional. I am not sure that drawing fluid from it would make much difference at this point, as she is not complaining of worsening shortness of breath or cough. However that remains a possibility if she becomes more short of breath in the future. She would probably benefit at that time from Pleurx placement.  She has lost some weight. I gave her some instructions on what kind of diet she would need to be on to pick up a little bit of weight before the next visit here.  She will see me again in January. She knows to call for any problems that may develop before that visit.  Accordingly she will start  Magrinat, [name] Dad, MD  01/23/17 1:49 PM Medical Oncology and Hematology [facility] [address] [place], [place] [address] Tel. [phone]    Fax. [phone]  This document serves as a record of services personally performed by Lurline Del, MD. It was created on her behalf by Steva Colder, a trained medical scribe. The creation of this record is based on the scribe's personal observations and the provider's statements to them. This document has been checked and approved by the attending provider.

## 2017-01-24 ENCOUNTER — Telehealth: Payer: Self-pay | Admitting: Oncology

## 2017-01-24 LAB — CANCER ANTIGEN 27.29: CA 27.29: 761.5 U/mL — ABNORMAL HIGH (ref 0.0–38.6)

## 2017-01-24 NOTE — Telephone Encounter (Signed)
Called patient regarding January 2019 appointment

## 2017-01-25 DIAGNOSIS — Z1231 Encounter for screening mammogram for malignant neoplasm of breast: Secondary | ICD-10-CM | POA: Diagnosis not present

## 2017-01-30 DIAGNOSIS — Z888 Allergy status to other drugs, medicaments and biological substances status: Secondary | ICD-10-CM | POA: Diagnosis not present

## 2017-01-30 DIAGNOSIS — J90 Pleural effusion, not elsewhere classified: Secondary | ICD-10-CM | POA: Diagnosis not present

## 2017-01-30 DIAGNOSIS — Z7982 Long term (current) use of aspirin: Secondary | ICD-10-CM | POA: Diagnosis not present

## 2017-01-30 DIAGNOSIS — Z79899 Other long term (current) drug therapy: Secondary | ICD-10-CM | POA: Diagnosis not present

## 2017-01-30 DIAGNOSIS — E039 Hypothyroidism, unspecified: Secondary | ICD-10-CM | POA: Diagnosis not present

## 2017-01-30 DIAGNOSIS — R4182 Altered mental status, unspecified: Secondary | ICD-10-CM | POA: Diagnosis not present

## 2017-01-30 DIAGNOSIS — J9811 Atelectasis: Secondary | ICD-10-CM | POA: Diagnosis not present

## 2017-01-30 DIAGNOSIS — I10 Essential (primary) hypertension: Secondary | ICD-10-CM | POA: Diagnosis not present

## 2017-01-30 DIAGNOSIS — R413 Other amnesia: Secondary | ICD-10-CM | POA: Diagnosis not present

## 2017-01-30 DIAGNOSIS — R531 Weakness: Secondary | ICD-10-CM | POA: Diagnosis not present

## 2017-01-30 DIAGNOSIS — Z853 Personal history of malignant neoplasm of breast: Secondary | ICD-10-CM | POA: Diagnosis not present

## 2017-01-30 DIAGNOSIS — Z87891 Personal history of nicotine dependence: Secondary | ICD-10-CM | POA: Diagnosis not present

## 2017-02-06 ENCOUNTER — Other Ambulatory Visit: Payer: Self-pay | Admitting: *Deleted

## 2017-02-06 ENCOUNTER — Telehealth: Payer: Self-pay | Admitting: *Deleted

## 2017-02-06 DIAGNOSIS — J910 Malignant pleural effusion: Secondary | ICD-10-CM

## 2017-02-06 DIAGNOSIS — R918 Other nonspecific abnormal finding of lung field: Secondary | ICD-10-CM | POA: Diagnosis not present

## 2017-02-06 DIAGNOSIS — J90 Pleural effusion, not elsewhere classified: Secondary | ICD-10-CM | POA: Diagnosis not present

## 2017-02-06 DIAGNOSIS — Z170 Estrogen receptor positive status [ER+]: Principal | ICD-10-CM

## 2017-02-06 DIAGNOSIS — C50812 Malignant neoplasm of overlapping sites of left female breast: Secondary | ICD-10-CM

## 2017-02-06 DIAGNOSIS — C50919 Malignant neoplasm of unspecified site of unspecified female breast: Secondary | ICD-10-CM | POA: Diagnosis not present

## 2017-02-06 DIAGNOSIS — C50811 Malignant neoplasm of overlapping sites of right female breast: Secondary | ICD-10-CM

## 2017-02-06 NOTE — Telephone Encounter (Signed)
This RN spoke with pt per MD request to arrange a thoracentesis.  Per phone discussion Brianna Duke understands PleurX catheter will not be placed at this time - but we will monitor her symptoms including her cardiac MD concerns and if further drainage needed - catheter will be discussed at that time.  Brianna Duke verbalized understanding including receiving a call from scheduling for the above as well as to call this RN if she does not receive a call or has further questions.

## 2017-02-12 ENCOUNTER — Ambulatory Visit (HOSPITAL_COMMUNITY)
Admission: RE | Admit: 2017-02-12 | Discharge: 2017-02-12 | Disposition: A | Discharge status: Home / Self Care | Payer: Medicare Other | Source: Ambulatory Visit | Attending: Hematology and Oncology | Admitting: Hematology and Oncology

## 2017-02-12 ENCOUNTER — Ambulatory Visit (HOSPITAL_COMMUNITY)
Admission: RE | Admit: 2017-02-12 | Discharge: 2017-02-12 | Disposition: A | Discharge status: Home / Self Care | Payer: Medicare Other | Source: Ambulatory Visit | Attending: General Surgery | Admitting: General Surgery

## 2017-02-12 DIAGNOSIS — Z170 Estrogen receptor positive status [ER+]: Secondary | ICD-10-CM | POA: Diagnosis not present

## 2017-02-12 DIAGNOSIS — C50811 Malignant neoplasm of overlapping sites of right female breast: Secondary | ICD-10-CM | POA: Insufficient documentation

## 2017-02-12 DIAGNOSIS — J910 Malignant pleural effusion: Secondary | ICD-10-CM | POA: Insufficient documentation

## 2017-02-12 DIAGNOSIS — C50812 Malignant neoplasm of overlapping sites of left female breast: Secondary | ICD-10-CM | POA: Insufficient documentation

## 2017-02-12 DIAGNOSIS — J90 Pleural effusion, not elsewhere classified: Secondary | ICD-10-CM | POA: Diagnosis not present

## 2017-02-12 DIAGNOSIS — Z9889 Other specified postprocedural states: Secondary | ICD-10-CM

## 2017-02-12 MED ORDER — LIDOCAINE HCL 2 % IJ SOLN
INTRAMUSCULAR | Status: AC
  Filled 2017-02-12: qty 10

## 2017-02-12 NOTE — Procedures (Addendum)
Ultrasound-guided therapeutic left thoracentesis performed yielding 0.3 liters of serous (transitioned to serosanguineous) colored fluid. No immediate complications. Follow-up chest x-ray pending.       Brianna Duke E 10:53 AM 02/12/2017

## 2017-02-20 ENCOUNTER — Other Ambulatory Visit: Payer: Self-pay | Admitting: Oncology

## 2017-02-20 ENCOUNTER — Ambulatory Visit: Payer: Medicare Other

## 2017-02-20 ENCOUNTER — Other Ambulatory Visit: Payer: Self-pay

## 2017-02-20 ENCOUNTER — Encounter: Payer: Self-pay | Admitting: Oncology

## 2017-02-20 ENCOUNTER — Other Ambulatory Visit: Payer: Medicare Other

## 2017-02-20 DIAGNOSIS — J438 Other emphysema: Secondary | ICD-10-CM

## 2017-02-20 DIAGNOSIS — C50812 Malignant neoplasm of overlapping sites of left female breast: Secondary | ICD-10-CM

## 2017-02-20 DIAGNOSIS — Z170 Estrogen receptor positive status [ER+]: Principal | ICD-10-CM

## 2017-02-20 DIAGNOSIS — J910 Malignant pleural effusion: Secondary | ICD-10-CM

## 2017-02-20 NOTE — Progress Notes (Unsigned)
[place] called today and spoke to our nurse. She has decided against further breast cancer treatment, against further visits here, and desires a hospice referral. Because we could not confirm that this was [place], although it did sound like [place], I have sent her a letter stating what we have done and of course if that is not what she decided she will let us know. I do believe though that this is what she does desire and a hospice referral has been placed.

## 2017-02-22 DIAGNOSIS — C50812 Malignant neoplasm of overlapping sites of left female breast: Secondary | ICD-10-CM | POA: Diagnosis not present

## 2017-02-22 DIAGNOSIS — C7989 Secondary malignant neoplasm of other specified sites: Secondary | ICD-10-CM | POA: Diagnosis not present

## 2017-02-22 DIAGNOSIS — C7802 Secondary malignant neoplasm of left lung: Secondary | ICD-10-CM | POA: Diagnosis not present

## 2017-02-22 DIAGNOSIS — C787 Secondary malignant neoplasm of liver and intrahepatic bile duct: Secondary | ICD-10-CM | POA: Diagnosis not present

## 2017-02-22 DIAGNOSIS — C50911 Malignant neoplasm of unspecified site of right female breast: Secondary | ICD-10-CM | POA: Diagnosis not present

## 2017-02-22 DIAGNOSIS — C7972 Secondary malignant neoplasm of left adrenal gland: Secondary | ICD-10-CM | POA: Diagnosis not present

## 2017-02-26 DIAGNOSIS — C7972 Secondary malignant neoplasm of left adrenal gland: Secondary | ICD-10-CM | POA: Diagnosis not present

## 2017-02-26 DIAGNOSIS — C50911 Malignant neoplasm of unspecified site of right female breast: Secondary | ICD-10-CM | POA: Diagnosis not present

## 2017-02-26 DIAGNOSIS — C7989 Secondary malignant neoplasm of other specified sites: Secondary | ICD-10-CM | POA: Diagnosis not present

## 2017-02-26 DIAGNOSIS — C7802 Secondary malignant neoplasm of left lung: Secondary | ICD-10-CM | POA: Diagnosis not present

## 2017-02-26 DIAGNOSIS — C787 Secondary malignant neoplasm of liver and intrahepatic bile duct: Secondary | ICD-10-CM | POA: Diagnosis not present

## 2017-02-26 DIAGNOSIS — C50812 Malignant neoplasm of overlapping sites of left female breast: Secondary | ICD-10-CM | POA: Diagnosis not present

## 2017-03-06 DIAGNOSIS — C7802 Secondary malignant neoplasm of left lung: Secondary | ICD-10-CM | POA: Diagnosis not present

## 2017-03-06 DIAGNOSIS — C50812 Malignant neoplasm of overlapping sites of left female breast: Secondary | ICD-10-CM | POA: Diagnosis not present

## 2017-03-06 DIAGNOSIS — C787 Secondary malignant neoplasm of liver and intrahepatic bile duct: Secondary | ICD-10-CM | POA: Diagnosis not present

## 2017-03-06 DIAGNOSIS — C7972 Secondary malignant neoplasm of left adrenal gland: Secondary | ICD-10-CM | POA: Diagnosis not present

## 2017-03-06 DIAGNOSIS — C50911 Malignant neoplasm of unspecified site of right female breast: Secondary | ICD-10-CM | POA: Diagnosis not present

## 2017-03-06 DIAGNOSIS — C7989 Secondary malignant neoplasm of other specified sites: Secondary | ICD-10-CM | POA: Diagnosis not present

## 2017-03-08 DIAGNOSIS — C7972 Secondary malignant neoplasm of left adrenal gland: Secondary | ICD-10-CM | POA: Diagnosis not present

## 2017-03-08 DIAGNOSIS — C787 Secondary malignant neoplasm of liver and intrahepatic bile duct: Secondary | ICD-10-CM | POA: Diagnosis not present

## 2017-03-08 DIAGNOSIS — C50812 Malignant neoplasm of overlapping sites of left female breast: Secondary | ICD-10-CM | POA: Diagnosis not present

## 2017-03-08 DIAGNOSIS — C7802 Secondary malignant neoplasm of left lung: Secondary | ICD-10-CM | POA: Diagnosis not present

## 2017-03-08 DIAGNOSIS — C7989 Secondary malignant neoplasm of other specified sites: Secondary | ICD-10-CM | POA: Diagnosis not present

## 2017-03-08 DIAGNOSIS — C50911 Malignant neoplasm of unspecified site of right female breast: Secondary | ICD-10-CM | POA: Diagnosis not present

## 2017-03-13 DIAGNOSIS — C787 Secondary malignant neoplasm of liver and intrahepatic bile duct: Secondary | ICD-10-CM | POA: Diagnosis not present

## 2017-03-13 DIAGNOSIS — C50812 Malignant neoplasm of overlapping sites of left female breast: Secondary | ICD-10-CM | POA: Diagnosis not present

## 2017-03-13 DIAGNOSIS — C50911 Malignant neoplasm of unspecified site of right female breast: Secondary | ICD-10-CM | POA: Diagnosis not present

## 2017-03-13 DIAGNOSIS — C7972 Secondary malignant neoplasm of left adrenal gland: Secondary | ICD-10-CM | POA: Diagnosis not present

## 2017-03-13 DIAGNOSIS — C7802 Secondary malignant neoplasm of left lung: Secondary | ICD-10-CM | POA: Diagnosis not present

## 2017-03-13 DIAGNOSIS — C7989 Secondary malignant neoplasm of other specified sites: Secondary | ICD-10-CM | POA: Diagnosis not present

## 2017-03-15 ENCOUNTER — Other Ambulatory Visit: Payer: Self-pay | Admitting: Nurse Practitioner

## 2017-03-16 DIAGNOSIS — C7802 Secondary malignant neoplasm of left lung: Secondary | ICD-10-CM | POA: Diagnosis not present

## 2017-03-16 DIAGNOSIS — C50911 Malignant neoplasm of unspecified site of right female breast: Secondary | ICD-10-CM | POA: Diagnosis not present

## 2017-03-16 DIAGNOSIS — C7972 Secondary malignant neoplasm of left adrenal gland: Secondary | ICD-10-CM | POA: Diagnosis not present

## 2017-03-16 DIAGNOSIS — C50812 Malignant neoplasm of overlapping sites of left female breast: Secondary | ICD-10-CM | POA: Diagnosis not present

## 2017-03-16 DIAGNOSIS — C7989 Secondary malignant neoplasm of other specified sites: Secondary | ICD-10-CM | POA: Diagnosis not present

## 2017-03-16 DIAGNOSIS — C787 Secondary malignant neoplasm of liver and intrahepatic bile duct: Secondary | ICD-10-CM | POA: Diagnosis not present

## 2017-03-20 ENCOUNTER — Ambulatory Visit: Payer: Medicare Other

## 2017-03-20 ENCOUNTER — Other Ambulatory Visit: Payer: Medicare Other

## 2017-03-21 DIAGNOSIS — C50812 Malignant neoplasm of overlapping sites of left female breast: Secondary | ICD-10-CM | POA: Diagnosis not present

## 2017-03-21 DIAGNOSIS — C787 Secondary malignant neoplasm of liver and intrahepatic bile duct: Secondary | ICD-10-CM | POA: Diagnosis not present

## 2017-03-21 DIAGNOSIS — C7989 Secondary malignant neoplasm of other specified sites: Secondary | ICD-10-CM | POA: Diagnosis not present

## 2017-03-21 DIAGNOSIS — C7802 Secondary malignant neoplasm of left lung: Secondary | ICD-10-CM | POA: Diagnosis not present

## 2017-03-21 DIAGNOSIS — C7972 Secondary malignant neoplasm of left adrenal gland: Secondary | ICD-10-CM | POA: Diagnosis not present

## 2017-03-21 DIAGNOSIS — C50911 Malignant neoplasm of unspecified site of right female breast: Secondary | ICD-10-CM | POA: Diagnosis not present

## 2017-03-27 DIAGNOSIS — C50911 Malignant neoplasm of unspecified site of right female breast: Secondary | ICD-10-CM | POA: Diagnosis not present

## 2017-03-27 DIAGNOSIS — C50812 Malignant neoplasm of overlapping sites of left female breast: Secondary | ICD-10-CM | POA: Diagnosis not present

## 2017-03-27 DIAGNOSIS — C787 Secondary malignant neoplasm of liver and intrahepatic bile duct: Secondary | ICD-10-CM | POA: Diagnosis not present

## 2017-03-27 DIAGNOSIS — C7972 Secondary malignant neoplasm of left adrenal gland: Secondary | ICD-10-CM | POA: Diagnosis not present

## 2017-03-27 DIAGNOSIS — C7989 Secondary malignant neoplasm of other specified sites: Secondary | ICD-10-CM | POA: Diagnosis not present

## 2017-03-27 DIAGNOSIS — C7802 Secondary malignant neoplasm of left lung: Secondary | ICD-10-CM | POA: Diagnosis not present

## 2017-04-03 DIAGNOSIS — I700 Atherosclerosis of aorta: Secondary | ICD-10-CM | POA: Diagnosis not present

## 2017-04-03 DIAGNOSIS — C50911 Malignant neoplasm of unspecified site of right female breast: Secondary | ICD-10-CM | POA: Diagnosis not present

## 2017-04-03 DIAGNOSIS — C7802 Secondary malignant neoplasm of left lung: Secondary | ICD-10-CM | POA: Diagnosis not present

## 2017-04-03 DIAGNOSIS — J320 Chronic maxillary sinusitis: Secondary | ICD-10-CM | POA: Diagnosis not present

## 2017-04-03 DIAGNOSIS — C50812 Malignant neoplasm of overlapping sites of left female breast: Secondary | ICD-10-CM | POA: Diagnosis not present

## 2017-04-03 DIAGNOSIS — C7989 Secondary malignant neoplasm of other specified sites: Secondary | ICD-10-CM | POA: Diagnosis not present

## 2017-04-03 DIAGNOSIS — C7972 Secondary malignant neoplasm of left adrenal gland: Secondary | ICD-10-CM | POA: Diagnosis not present

## 2017-04-03 DIAGNOSIS — C787 Secondary malignant neoplasm of liver and intrahepatic bile duct: Secondary | ICD-10-CM | POA: Diagnosis not present

## 2017-04-03 DIAGNOSIS — C50919 Malignant neoplasm of unspecified site of unspecified female breast: Secondary | ICD-10-CM | POA: Diagnosis not present

## 2017-04-04 DIAGNOSIS — C7989 Secondary malignant neoplasm of other specified sites: Secondary | ICD-10-CM | POA: Diagnosis not present

## 2017-04-04 DIAGNOSIS — C50911 Malignant neoplasm of unspecified site of right female breast: Secondary | ICD-10-CM | POA: Diagnosis not present

## 2017-04-04 DIAGNOSIS — C50812 Malignant neoplasm of overlapping sites of left female breast: Secondary | ICD-10-CM | POA: Diagnosis not present

## 2017-04-04 DIAGNOSIS — C7972 Secondary malignant neoplasm of left adrenal gland: Secondary | ICD-10-CM | POA: Diagnosis not present

## 2017-04-04 DIAGNOSIS — C7802 Secondary malignant neoplasm of left lung: Secondary | ICD-10-CM | POA: Diagnosis not present

## 2017-04-04 DIAGNOSIS — C787 Secondary malignant neoplasm of liver and intrahepatic bile duct: Secondary | ICD-10-CM | POA: Diagnosis not present

## 2017-04-04 IMAGING — CR DG CHEST 2V
2 series · 2 of 2 positions shown · non-contrast
Comparison: 11/03/2014

CLINICAL DATA: COPD, left pleural effusion

EXAM:
CHEST  2 VIEW

[w chest pa]
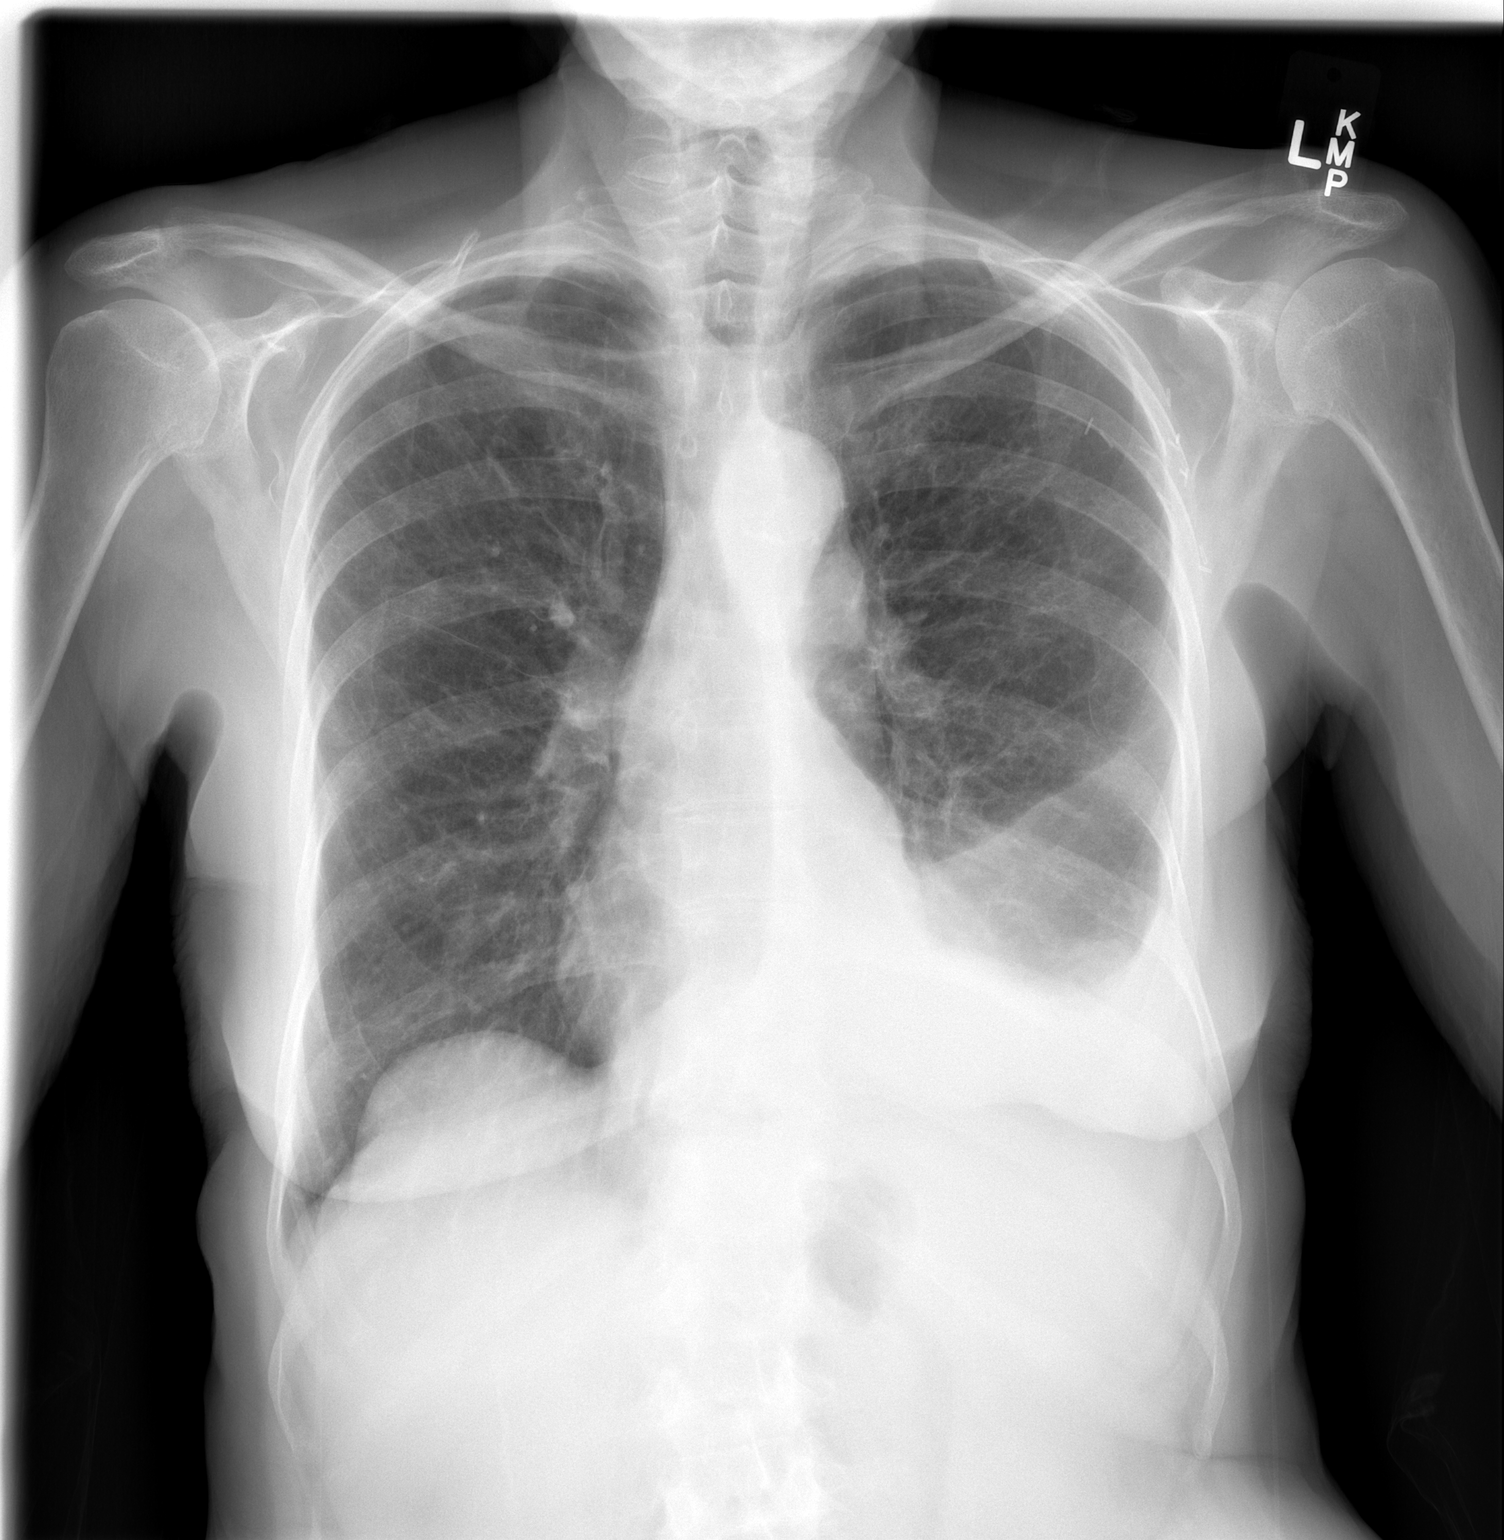

[w chest lat]
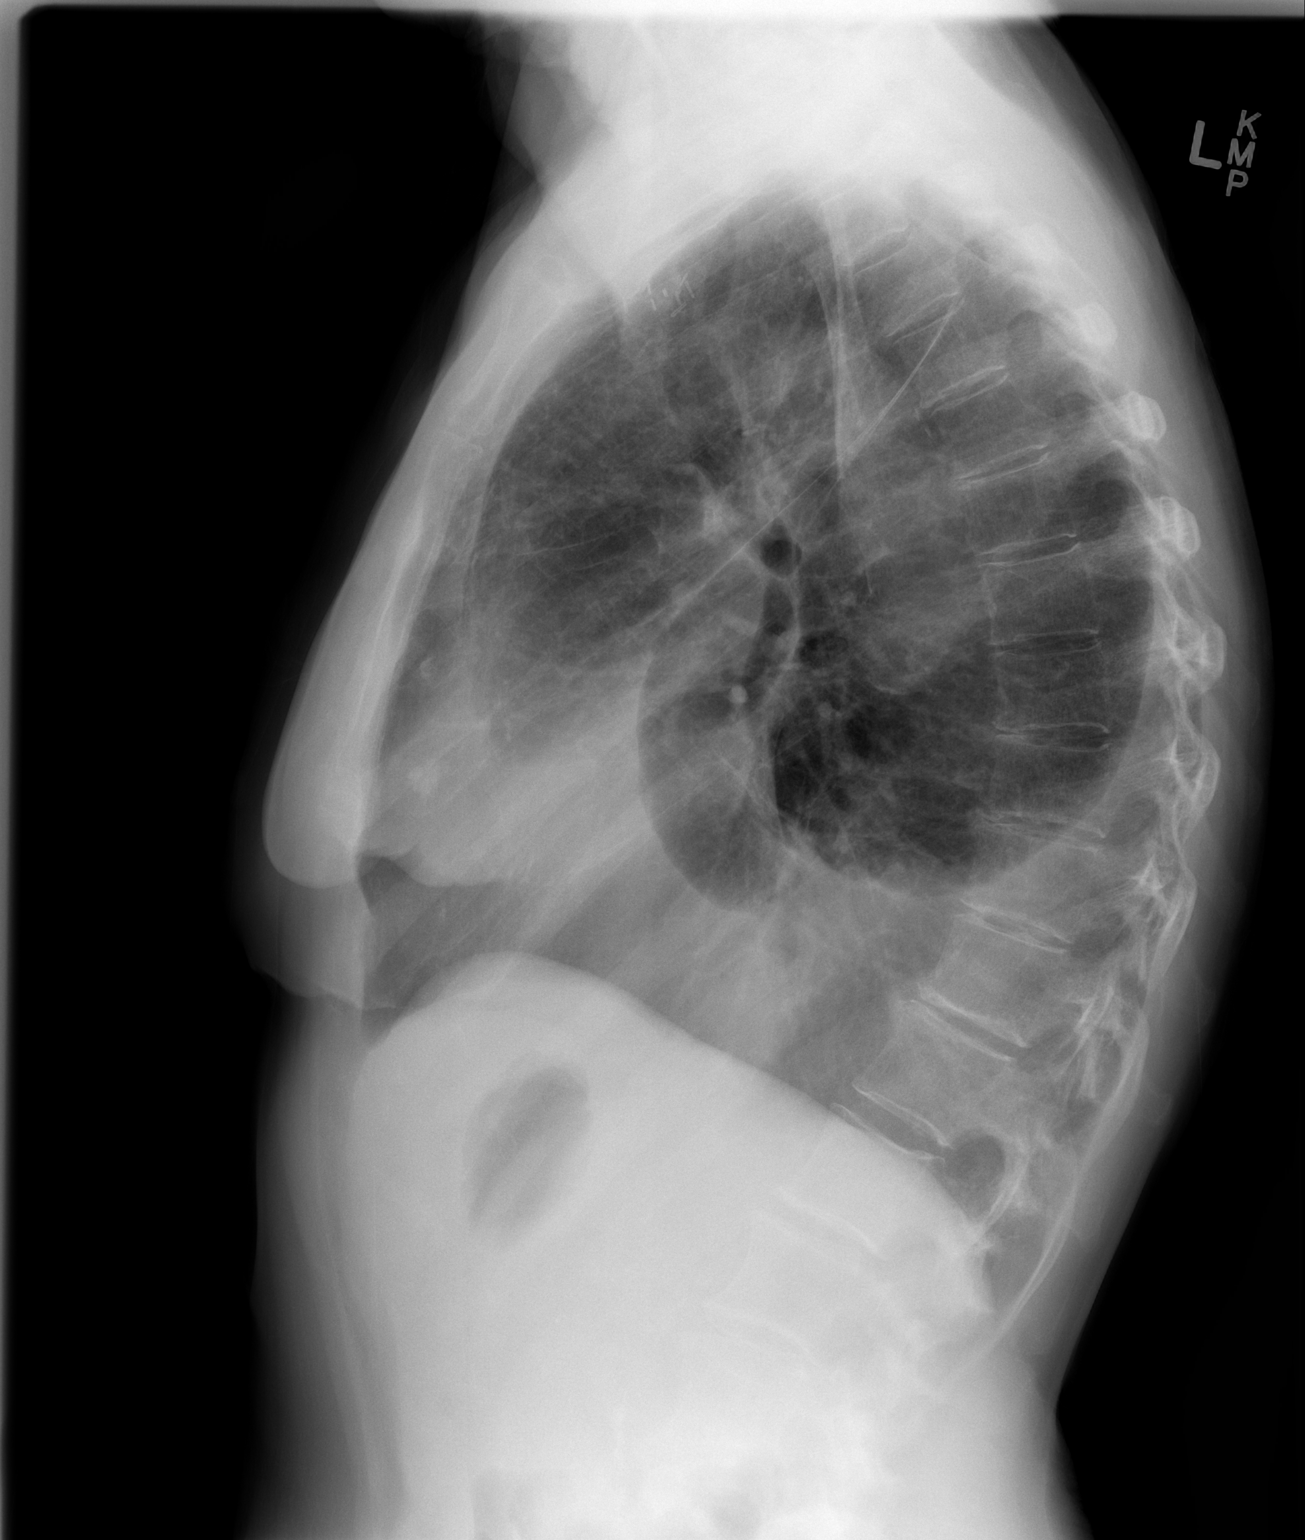

[2 of 2 positions shown; findings below may reference images not displayed]

FINDINGS: There is a small left pleural effusion. There is no right pleural
effusion. There is no pneumothorax. There is no focal consolidation.
Stable cardiomediastinal silhouette. No acute osseous abnormality.
IMPRESSION: Stable small left pleural effusion.

## 2017-04-07 DIAGNOSIS — C50911 Malignant neoplasm of unspecified site of right female breast: Secondary | ICD-10-CM | POA: Diagnosis not present

## 2017-04-07 DIAGNOSIS — C7802 Secondary malignant neoplasm of left lung: Secondary | ICD-10-CM | POA: Diagnosis not present

## 2017-04-07 DIAGNOSIS — C50812 Malignant neoplasm of overlapping sites of left female breast: Secondary | ICD-10-CM | POA: Diagnosis not present

## 2017-04-07 DIAGNOSIS — C787 Secondary malignant neoplasm of liver and intrahepatic bile duct: Secondary | ICD-10-CM | POA: Diagnosis not present

## 2017-04-07 DIAGNOSIS — C7972 Secondary malignant neoplasm of left adrenal gland: Secondary | ICD-10-CM | POA: Diagnosis not present

## 2017-04-07 DIAGNOSIS — C7989 Secondary malignant neoplasm of other specified sites: Secondary | ICD-10-CM | POA: Diagnosis not present

## 2017-04-10 DIAGNOSIS — C787 Secondary malignant neoplasm of liver and intrahepatic bile duct: Secondary | ICD-10-CM | POA: Diagnosis not present

## 2017-04-10 DIAGNOSIS — C7972 Secondary malignant neoplasm of left adrenal gland: Secondary | ICD-10-CM | POA: Diagnosis not present

## 2017-04-10 DIAGNOSIS — C50812 Malignant neoplasm of overlapping sites of left female breast: Secondary | ICD-10-CM | POA: Diagnosis not present

## 2017-04-10 DIAGNOSIS — C50911 Malignant neoplasm of unspecified site of right female breast: Secondary | ICD-10-CM | POA: Diagnosis not present

## 2017-04-10 DIAGNOSIS — C7989 Secondary malignant neoplasm of other specified sites: Secondary | ICD-10-CM | POA: Diagnosis not present

## 2017-04-10 DIAGNOSIS — C7802 Secondary malignant neoplasm of left lung: Secondary | ICD-10-CM | POA: Diagnosis not present

## 2017-04-17 DIAGNOSIS — C7989 Secondary malignant neoplasm of other specified sites: Secondary | ICD-10-CM | POA: Diagnosis not present

## 2017-04-17 DIAGNOSIS — C7972 Secondary malignant neoplasm of left adrenal gland: Secondary | ICD-10-CM | POA: Diagnosis not present

## 2017-04-17 DIAGNOSIS — C7802 Secondary malignant neoplasm of left lung: Secondary | ICD-10-CM | POA: Diagnosis not present

## 2017-04-17 DIAGNOSIS — C50911 Malignant neoplasm of unspecified site of right female breast: Secondary | ICD-10-CM | POA: Diagnosis not present

## 2017-04-17 DIAGNOSIS — C50812 Malignant neoplasm of overlapping sites of left female breast: Secondary | ICD-10-CM | POA: Diagnosis not present

## 2017-04-17 DIAGNOSIS — C787 Secondary malignant neoplasm of liver and intrahepatic bile duct: Secondary | ICD-10-CM | POA: Diagnosis not present

## 2017-04-19 DIAGNOSIS — Z961 Presence of intraocular lens: Secondary | ICD-10-CM | POA: Diagnosis not present

## 2017-04-19 DIAGNOSIS — C7989 Secondary malignant neoplasm of other specified sites: Secondary | ICD-10-CM | POA: Diagnosis not present

## 2017-04-19 DIAGNOSIS — C50911 Malignant neoplasm of unspecified site of right female breast: Secondary | ICD-10-CM | POA: Diagnosis not present

## 2017-04-19 DIAGNOSIS — C787 Secondary malignant neoplasm of liver and intrahepatic bile duct: Secondary | ICD-10-CM | POA: Diagnosis not present

## 2017-04-19 DIAGNOSIS — C50812 Malignant neoplasm of overlapping sites of left female breast: Secondary | ICD-10-CM | POA: Diagnosis not present

## 2017-04-19 DIAGNOSIS — C7802 Secondary malignant neoplasm of left lung: Secondary | ICD-10-CM | POA: Diagnosis not present

## 2017-04-19 DIAGNOSIS — C7972 Secondary malignant neoplasm of left adrenal gland: Secondary | ICD-10-CM | POA: Diagnosis not present

## 2017-04-19 DIAGNOSIS — G510 Bell's palsy: Secondary | ICD-10-CM | POA: Diagnosis not present

## 2017-04-23 DIAGNOSIS — C7972 Secondary malignant neoplasm of left adrenal gland: Secondary | ICD-10-CM | POA: Diagnosis not present

## 2017-04-23 DIAGNOSIS — C7802 Secondary malignant neoplasm of left lung: Secondary | ICD-10-CM | POA: Diagnosis not present

## 2017-04-23 DIAGNOSIS — C50812 Malignant neoplasm of overlapping sites of left female breast: Secondary | ICD-10-CM | POA: Diagnosis not present

## 2017-04-23 DIAGNOSIS — C7989 Secondary malignant neoplasm of other specified sites: Secondary | ICD-10-CM | POA: Diagnosis not present

## 2017-04-23 DIAGNOSIS — C787 Secondary malignant neoplasm of liver and intrahepatic bile duct: Secondary | ICD-10-CM | POA: Diagnosis not present

## 2017-04-23 DIAGNOSIS — C50911 Malignant neoplasm of unspecified site of right female breast: Secondary | ICD-10-CM | POA: Diagnosis not present

## 2017-04-25 DIAGNOSIS — C787 Secondary malignant neoplasm of liver and intrahepatic bile duct: Secondary | ICD-10-CM | POA: Diagnosis not present

## 2017-04-25 DIAGNOSIS — C7802 Secondary malignant neoplasm of left lung: Secondary | ICD-10-CM | POA: Diagnosis not present

## 2017-04-25 DIAGNOSIS — C50911 Malignant neoplasm of unspecified site of right female breast: Secondary | ICD-10-CM | POA: Diagnosis not present

## 2017-04-25 DIAGNOSIS — C7972 Secondary malignant neoplasm of left adrenal gland: Secondary | ICD-10-CM | POA: Diagnosis not present

## 2017-04-25 DIAGNOSIS — C50812 Malignant neoplasm of overlapping sites of left female breast: Secondary | ICD-10-CM | POA: Diagnosis not present

## 2017-04-25 DIAGNOSIS — C7989 Secondary malignant neoplasm of other specified sites: Secondary | ICD-10-CM | POA: Diagnosis not present

## 2017-04-26 DIAGNOSIS — C787 Secondary malignant neoplasm of liver and intrahepatic bile duct: Secondary | ICD-10-CM | POA: Diagnosis not present

## 2017-04-26 DIAGNOSIS — C50812 Malignant neoplasm of overlapping sites of left female breast: Secondary | ICD-10-CM | POA: Diagnosis not present

## 2017-04-26 DIAGNOSIS — C50911 Malignant neoplasm of unspecified site of right female breast: Secondary | ICD-10-CM | POA: Diagnosis not present

## 2017-04-26 DIAGNOSIS — C7972 Secondary malignant neoplasm of left adrenal gland: Secondary | ICD-10-CM | POA: Diagnosis not present

## 2017-04-26 DIAGNOSIS — C7989 Secondary malignant neoplasm of other specified sites: Secondary | ICD-10-CM | POA: Diagnosis not present

## 2017-04-26 DIAGNOSIS — C7802 Secondary malignant neoplasm of left lung: Secondary | ICD-10-CM | POA: Diagnosis not present

## 2017-04-30 DIAGNOSIS — C787 Secondary malignant neoplasm of liver and intrahepatic bile duct: Secondary | ICD-10-CM | POA: Diagnosis not present

## 2017-04-30 DIAGNOSIS — C50911 Malignant neoplasm of unspecified site of right female breast: Secondary | ICD-10-CM | POA: Diagnosis not present

## 2017-04-30 DIAGNOSIS — C7989 Secondary malignant neoplasm of other specified sites: Secondary | ICD-10-CM | POA: Diagnosis not present

## 2017-04-30 DIAGNOSIS — C7802 Secondary malignant neoplasm of left lung: Secondary | ICD-10-CM | POA: Diagnosis not present

## 2017-04-30 DIAGNOSIS — C7972 Secondary malignant neoplasm of left adrenal gland: Secondary | ICD-10-CM | POA: Diagnosis not present

## 2017-04-30 DIAGNOSIS — C50812 Malignant neoplasm of overlapping sites of left female breast: Secondary | ICD-10-CM | POA: Diagnosis not present

## 2017-05-08 DIAGNOSIS — C50812 Malignant neoplasm of overlapping sites of left female breast: Secondary | ICD-10-CM | POA: Diagnosis not present

## 2017-05-08 DIAGNOSIS — C7989 Secondary malignant neoplasm of other specified sites: Secondary | ICD-10-CM | POA: Diagnosis not present

## 2017-05-08 DIAGNOSIS — C50911 Malignant neoplasm of unspecified site of right female breast: Secondary | ICD-10-CM | POA: Diagnosis not present

## 2017-05-08 DIAGNOSIS — C7972 Secondary malignant neoplasm of left adrenal gland: Secondary | ICD-10-CM | POA: Diagnosis not present

## 2017-05-08 DIAGNOSIS — C787 Secondary malignant neoplasm of liver and intrahepatic bile duct: Secondary | ICD-10-CM | POA: Diagnosis not present

## 2017-05-08 DIAGNOSIS — C7802 Secondary malignant neoplasm of left lung: Secondary | ICD-10-CM | POA: Diagnosis not present

## 2017-05-11 DIAGNOSIS — C7989 Secondary malignant neoplasm of other specified sites: Secondary | ICD-10-CM | POA: Diagnosis not present

## 2017-05-11 DIAGNOSIS — C50911 Malignant neoplasm of unspecified site of right female breast: Secondary | ICD-10-CM | POA: Diagnosis not present

## 2017-05-11 DIAGNOSIS — C50812 Malignant neoplasm of overlapping sites of left female breast: Secondary | ICD-10-CM | POA: Diagnosis not present

## 2017-05-11 DIAGNOSIS — C7802 Secondary malignant neoplasm of left lung: Secondary | ICD-10-CM | POA: Diagnosis not present

## 2017-05-11 DIAGNOSIS — C7972 Secondary malignant neoplasm of left adrenal gland: Secondary | ICD-10-CM | POA: Diagnosis not present

## 2017-05-11 DIAGNOSIS — C787 Secondary malignant neoplasm of liver and intrahepatic bile duct: Secondary | ICD-10-CM | POA: Diagnosis not present

## 2017-05-16 DIAGNOSIS — C787 Secondary malignant neoplasm of liver and intrahepatic bile duct: Secondary | ICD-10-CM | POA: Diagnosis not present

## 2017-05-16 DIAGNOSIS — C7972 Secondary malignant neoplasm of left adrenal gland: Secondary | ICD-10-CM | POA: Diagnosis not present

## 2017-05-16 DIAGNOSIS — C50812 Malignant neoplasm of overlapping sites of left female breast: Secondary | ICD-10-CM | POA: Diagnosis not present

## 2017-05-16 DIAGNOSIS — C7989 Secondary malignant neoplasm of other specified sites: Secondary | ICD-10-CM | POA: Diagnosis not present

## 2017-05-16 DIAGNOSIS — C50911 Malignant neoplasm of unspecified site of right female breast: Secondary | ICD-10-CM | POA: Diagnosis not present

## 2017-05-16 DIAGNOSIS — C7802 Secondary malignant neoplasm of left lung: Secondary | ICD-10-CM | POA: Diagnosis not present

## 2017-05-22 NOTE — Progress Notes (Signed)
No show

## 2017-05-23 ENCOUNTER — Other Ambulatory Visit: Payer: Medicare Other

## 2017-05-23 ENCOUNTER — Encounter: Payer: Self-pay | Admitting: Oncology

## 2017-05-23 ENCOUNTER — Ambulatory Visit (HOSPITAL_BASED_OUTPATIENT_CLINIC_OR_DEPARTMENT_OTHER): Payer: Medicare Other | Admitting: Oncology

## 2017-05-23 DIAGNOSIS — C787 Secondary malignant neoplasm of liver and intrahepatic bile duct: Secondary | ICD-10-CM | POA: Diagnosis not present

## 2017-05-23 DIAGNOSIS — C50911 Malignant neoplasm of unspecified site of right female breast: Secondary | ICD-10-CM | POA: Diagnosis not present

## 2017-05-23 DIAGNOSIS — C50812 Malignant neoplasm of overlapping sites of left female breast: Secondary | ICD-10-CM

## 2017-05-23 DIAGNOSIS — C7802 Secondary malignant neoplasm of left lung: Secondary | ICD-10-CM | POA: Diagnosis not present

## 2017-05-23 DIAGNOSIS — Z170 Estrogen receptor positive status [ER+]: Secondary | ICD-10-CM

## 2017-05-23 DIAGNOSIS — C7989 Secondary malignant neoplasm of other specified sites: Secondary | ICD-10-CM | POA: Diagnosis not present

## 2017-05-23 DIAGNOSIS — C7972 Secondary malignant neoplasm of left adrenal gland: Secondary | ICD-10-CM | POA: Diagnosis not present

## 2017-05-30 DIAGNOSIS — L298 Other pruritus: Secondary | ICD-10-CM | POA: Diagnosis not present

## 2017-05-30 DIAGNOSIS — L821 Other seborrheic keratosis: Secondary | ICD-10-CM | POA: Diagnosis not present

## 2017-05-30 DIAGNOSIS — L820 Inflamed seborrheic keratosis: Secondary | ICD-10-CM | POA: Diagnosis not present

## 2017-05-30 DIAGNOSIS — L538 Other specified erythematous conditions: Secondary | ICD-10-CM | POA: Diagnosis not present

## 2017-05-30 DIAGNOSIS — L570 Actinic keratosis: Secondary | ICD-10-CM | POA: Diagnosis not present

## 2017-05-30 DIAGNOSIS — L814 Other melanin hyperpigmentation: Secondary | ICD-10-CM | POA: Diagnosis not present

## 2017-05-30 DIAGNOSIS — D485 Neoplasm of uncertain behavior of skin: Secondary | ICD-10-CM | POA: Diagnosis not present

## 2017-05-31 DIAGNOSIS — C787 Secondary malignant neoplasm of liver and intrahepatic bile duct: Secondary | ICD-10-CM | POA: Diagnosis not present

## 2017-05-31 DIAGNOSIS — C7972 Secondary malignant neoplasm of left adrenal gland: Secondary | ICD-10-CM | POA: Diagnosis not present

## 2017-05-31 DIAGNOSIS — C50812 Malignant neoplasm of overlapping sites of left female breast: Secondary | ICD-10-CM | POA: Diagnosis not present

## 2017-05-31 DIAGNOSIS — C7989 Secondary malignant neoplasm of other specified sites: Secondary | ICD-10-CM | POA: Diagnosis not present

## 2017-05-31 DIAGNOSIS — C50911 Malignant neoplasm of unspecified site of right female breast: Secondary | ICD-10-CM | POA: Diagnosis not present

## 2017-05-31 DIAGNOSIS — C7802 Secondary malignant neoplasm of left lung: Secondary | ICD-10-CM | POA: Diagnosis not present

## 2017-06-01 DIAGNOSIS — C7989 Secondary malignant neoplasm of other specified sites: Secondary | ICD-10-CM | POA: Diagnosis not present

## 2017-06-01 DIAGNOSIS — C7972 Secondary malignant neoplasm of left adrenal gland: Secondary | ICD-10-CM | POA: Diagnosis not present

## 2017-06-01 DIAGNOSIS — C50812 Malignant neoplasm of overlapping sites of left female breast: Secondary | ICD-10-CM | POA: Diagnosis not present

## 2017-06-01 DIAGNOSIS — C50911 Malignant neoplasm of unspecified site of right female breast: Secondary | ICD-10-CM | POA: Diagnosis not present

## 2017-06-01 DIAGNOSIS — C7802 Secondary malignant neoplasm of left lung: Secondary | ICD-10-CM | POA: Diagnosis not present

## 2017-06-01 DIAGNOSIS — C787 Secondary malignant neoplasm of liver and intrahepatic bile duct: Secondary | ICD-10-CM | POA: Diagnosis not present

## 2017-06-05 DIAGNOSIS — C50812 Malignant neoplasm of overlapping sites of left female breast: Secondary | ICD-10-CM | POA: Diagnosis not present

## 2017-06-05 DIAGNOSIS — C787 Secondary malignant neoplasm of liver and intrahepatic bile duct: Secondary | ICD-10-CM | POA: Diagnosis not present

## 2017-06-05 DIAGNOSIS — C7802 Secondary malignant neoplasm of left lung: Secondary | ICD-10-CM | POA: Diagnosis not present

## 2017-06-05 DIAGNOSIS — C50911 Malignant neoplasm of unspecified site of right female breast: Secondary | ICD-10-CM | POA: Diagnosis not present

## 2017-06-05 DIAGNOSIS — C7989 Secondary malignant neoplasm of other specified sites: Secondary | ICD-10-CM | POA: Diagnosis not present

## 2017-06-05 DIAGNOSIS — C7972 Secondary malignant neoplasm of left adrenal gland: Secondary | ICD-10-CM | POA: Diagnosis not present

## 2017-06-08 DIAGNOSIS — C50911 Malignant neoplasm of unspecified site of right female breast: Secondary | ICD-10-CM | POA: Diagnosis not present

## 2017-06-08 DIAGNOSIS — C787 Secondary malignant neoplasm of liver and intrahepatic bile duct: Secondary | ICD-10-CM | POA: Diagnosis not present

## 2017-06-08 DIAGNOSIS — C7802 Secondary malignant neoplasm of left lung: Secondary | ICD-10-CM | POA: Diagnosis not present

## 2017-06-08 DIAGNOSIS — C7972 Secondary malignant neoplasm of left adrenal gland: Secondary | ICD-10-CM | POA: Diagnosis not present

## 2017-06-08 DIAGNOSIS — C50812 Malignant neoplasm of overlapping sites of left female breast: Secondary | ICD-10-CM | POA: Diagnosis not present

## 2017-06-08 DIAGNOSIS — C7989 Secondary malignant neoplasm of other specified sites: Secondary | ICD-10-CM | POA: Diagnosis not present

## 2017-06-13 DIAGNOSIS — L820 Inflamed seborrheic keratosis: Secondary | ICD-10-CM | POA: Diagnosis not present

## 2017-06-15 DIAGNOSIS — C50812 Malignant neoplasm of overlapping sites of left female breast: Secondary | ICD-10-CM | POA: Diagnosis not present

## 2017-06-15 DIAGNOSIS — C787 Secondary malignant neoplasm of liver and intrahepatic bile duct: Secondary | ICD-10-CM | POA: Diagnosis not present

## 2017-06-15 DIAGNOSIS — C7802 Secondary malignant neoplasm of left lung: Secondary | ICD-10-CM | POA: Diagnosis not present

## 2017-06-15 DIAGNOSIS — C7989 Secondary malignant neoplasm of other specified sites: Secondary | ICD-10-CM | POA: Diagnosis not present

## 2017-06-15 DIAGNOSIS — C50911 Malignant neoplasm of unspecified site of right female breast: Secondary | ICD-10-CM | POA: Diagnosis not present

## 2017-06-15 DIAGNOSIS — C7972 Secondary malignant neoplasm of left adrenal gland: Secondary | ICD-10-CM | POA: Diagnosis not present

## 2017-06-19 DIAGNOSIS — C50911 Malignant neoplasm of unspecified site of right female breast: Secondary | ICD-10-CM | POA: Diagnosis not present

## 2017-06-19 DIAGNOSIS — C7972 Secondary malignant neoplasm of left adrenal gland: Secondary | ICD-10-CM | POA: Diagnosis not present

## 2017-06-19 DIAGNOSIS — C50812 Malignant neoplasm of overlapping sites of left female breast: Secondary | ICD-10-CM | POA: Diagnosis not present

## 2017-06-19 DIAGNOSIS — C7989 Secondary malignant neoplasm of other specified sites: Secondary | ICD-10-CM | POA: Diagnosis not present

## 2017-06-19 DIAGNOSIS — C787 Secondary malignant neoplasm of liver and intrahepatic bile duct: Secondary | ICD-10-CM | POA: Diagnosis not present

## 2017-06-19 DIAGNOSIS — C7802 Secondary malignant neoplasm of left lung: Secondary | ICD-10-CM | POA: Diagnosis not present

## 2017-06-26 DIAGNOSIS — C7989 Secondary malignant neoplasm of other specified sites: Secondary | ICD-10-CM | POA: Diagnosis not present

## 2017-06-26 DIAGNOSIS — C7802 Secondary malignant neoplasm of left lung: Secondary | ICD-10-CM | POA: Diagnosis not present

## 2017-06-26 DIAGNOSIS — C7972 Secondary malignant neoplasm of left adrenal gland: Secondary | ICD-10-CM | POA: Diagnosis not present

## 2017-06-26 DIAGNOSIS — C787 Secondary malignant neoplasm of liver and intrahepatic bile duct: Secondary | ICD-10-CM | POA: Diagnosis not present

## 2017-06-26 DIAGNOSIS — C50911 Malignant neoplasm of unspecified site of right female breast: Secondary | ICD-10-CM | POA: Diagnosis not present

## 2017-06-26 DIAGNOSIS — C50812 Malignant neoplasm of overlapping sites of left female breast: Secondary | ICD-10-CM | POA: Diagnosis not present

## 2017-07-02 DIAGNOSIS — C7989 Secondary malignant neoplasm of other specified sites: Secondary | ICD-10-CM | POA: Diagnosis not present

## 2017-07-02 DIAGNOSIS — C7802 Secondary malignant neoplasm of left lung: Secondary | ICD-10-CM | POA: Diagnosis not present

## 2017-07-02 DIAGNOSIS — C787 Secondary malignant neoplasm of liver and intrahepatic bile duct: Secondary | ICD-10-CM | POA: Diagnosis not present

## 2017-07-02 DIAGNOSIS — C50911 Malignant neoplasm of unspecified site of right female breast: Secondary | ICD-10-CM | POA: Diagnosis not present

## 2017-07-02 DIAGNOSIS — C7972 Secondary malignant neoplasm of left adrenal gland: Secondary | ICD-10-CM | POA: Diagnosis not present

## 2017-07-02 DIAGNOSIS — C50812 Malignant neoplasm of overlapping sites of left female breast: Secondary | ICD-10-CM | POA: Diagnosis not present

## 2017-07-03 DIAGNOSIS — C787 Secondary malignant neoplasm of liver and intrahepatic bile duct: Secondary | ICD-10-CM | POA: Diagnosis not present

## 2017-07-03 DIAGNOSIS — C7989 Secondary malignant neoplasm of other specified sites: Secondary | ICD-10-CM | POA: Diagnosis not present

## 2017-07-03 DIAGNOSIS — C7802 Secondary malignant neoplasm of left lung: Secondary | ICD-10-CM | POA: Diagnosis not present

## 2017-07-03 DIAGNOSIS — C50911 Malignant neoplasm of unspecified site of right female breast: Secondary | ICD-10-CM | POA: Diagnosis not present

## 2017-07-03 DIAGNOSIS — C50812 Malignant neoplasm of overlapping sites of left female breast: Secondary | ICD-10-CM | POA: Diagnosis not present

## 2017-07-03 DIAGNOSIS — C7972 Secondary malignant neoplasm of left adrenal gland: Secondary | ICD-10-CM | POA: Diagnosis not present

## 2017-07-06 DIAGNOSIS — C787 Secondary malignant neoplasm of liver and intrahepatic bile duct: Secondary | ICD-10-CM | POA: Diagnosis not present

## 2017-07-06 DIAGNOSIS — C7802 Secondary malignant neoplasm of left lung: Secondary | ICD-10-CM | POA: Diagnosis not present

## 2017-07-06 DIAGNOSIS — C50911 Malignant neoplasm of unspecified site of right female breast: Secondary | ICD-10-CM | POA: Diagnosis not present

## 2017-07-06 DIAGNOSIS — C50812 Malignant neoplasm of overlapping sites of left female breast: Secondary | ICD-10-CM | POA: Diagnosis not present

## 2017-07-06 DIAGNOSIS — C7989 Secondary malignant neoplasm of other specified sites: Secondary | ICD-10-CM | POA: Diagnosis not present

## 2017-07-06 DIAGNOSIS — C7972 Secondary malignant neoplasm of left adrenal gland: Secondary | ICD-10-CM | POA: Diagnosis not present

## 2017-07-11 DIAGNOSIS — C7989 Secondary malignant neoplasm of other specified sites: Secondary | ICD-10-CM | POA: Diagnosis not present

## 2017-07-11 DIAGNOSIS — C50812 Malignant neoplasm of overlapping sites of left female breast: Secondary | ICD-10-CM | POA: Diagnosis not present

## 2017-07-11 DIAGNOSIS — C787 Secondary malignant neoplasm of liver and intrahepatic bile duct: Secondary | ICD-10-CM | POA: Diagnosis not present

## 2017-07-11 DIAGNOSIS — C7802 Secondary malignant neoplasm of left lung: Secondary | ICD-10-CM | POA: Diagnosis not present

## 2017-07-11 DIAGNOSIS — C7972 Secondary malignant neoplasm of left adrenal gland: Secondary | ICD-10-CM | POA: Diagnosis not present

## 2017-07-11 DIAGNOSIS — C50911 Malignant neoplasm of unspecified site of right female breast: Secondary | ICD-10-CM | POA: Diagnosis not present

## 2017-07-17 DIAGNOSIS — C7802 Secondary malignant neoplasm of left lung: Secondary | ICD-10-CM | POA: Diagnosis not present

## 2017-07-17 DIAGNOSIS — C787 Secondary malignant neoplasm of liver and intrahepatic bile duct: Secondary | ICD-10-CM | POA: Diagnosis not present

## 2017-07-17 DIAGNOSIS — C7972 Secondary malignant neoplasm of left adrenal gland: Secondary | ICD-10-CM | POA: Diagnosis not present

## 2017-07-17 DIAGNOSIS — C50812 Malignant neoplasm of overlapping sites of left female breast: Secondary | ICD-10-CM | POA: Diagnosis not present

## 2017-07-17 DIAGNOSIS — C7989 Secondary malignant neoplasm of other specified sites: Secondary | ICD-10-CM | POA: Diagnosis not present

## 2017-07-17 DIAGNOSIS — C50911 Malignant neoplasm of unspecified site of right female breast: Secondary | ICD-10-CM | POA: Diagnosis not present

## 2017-07-18 DIAGNOSIS — C7972 Secondary malignant neoplasm of left adrenal gland: Secondary | ICD-10-CM | POA: Diagnosis not present

## 2017-07-18 DIAGNOSIS — C7802 Secondary malignant neoplasm of left lung: Secondary | ICD-10-CM | POA: Diagnosis not present

## 2017-07-18 DIAGNOSIS — C50812 Malignant neoplasm of overlapping sites of left female breast: Secondary | ICD-10-CM | POA: Diagnosis not present

## 2017-07-18 DIAGNOSIS — C50911 Malignant neoplasm of unspecified site of right female breast: Secondary | ICD-10-CM | POA: Diagnosis not present

## 2017-07-18 DIAGNOSIS — C787 Secondary malignant neoplasm of liver and intrahepatic bile duct: Secondary | ICD-10-CM | POA: Diagnosis not present

## 2017-07-18 DIAGNOSIS — C7989 Secondary malignant neoplasm of other specified sites: Secondary | ICD-10-CM | POA: Diagnosis not present

## 2017-07-19 DIAGNOSIS — J019 Acute sinusitis, unspecified: Secondary | ICD-10-CM | POA: Diagnosis not present

## 2017-07-19 DIAGNOSIS — H6691 Otitis media, unspecified, right ear: Secondary | ICD-10-CM | POA: Diagnosis not present

## 2017-07-25 DIAGNOSIS — C7972 Secondary malignant neoplasm of left adrenal gland: Secondary | ICD-10-CM | POA: Diagnosis not present

## 2017-07-25 DIAGNOSIS — C50911 Malignant neoplasm of unspecified site of right female breast: Secondary | ICD-10-CM | POA: Diagnosis not present

## 2017-07-25 DIAGNOSIS — C7989 Secondary malignant neoplasm of other specified sites: Secondary | ICD-10-CM | POA: Diagnosis not present

## 2017-07-25 DIAGNOSIS — C7802 Secondary malignant neoplasm of left lung: Secondary | ICD-10-CM | POA: Diagnosis not present

## 2017-07-25 DIAGNOSIS — C787 Secondary malignant neoplasm of liver and intrahepatic bile duct: Secondary | ICD-10-CM | POA: Diagnosis not present

## 2017-07-25 DIAGNOSIS — C50812 Malignant neoplasm of overlapping sites of left female breast: Secondary | ICD-10-CM | POA: Diagnosis not present

## 2017-08-01 DIAGNOSIS — C50812 Malignant neoplasm of overlapping sites of left female breast: Secondary | ICD-10-CM | POA: Diagnosis not present

## 2017-08-01 DIAGNOSIS — C787 Secondary malignant neoplasm of liver and intrahepatic bile duct: Secondary | ICD-10-CM | POA: Diagnosis not present

## 2017-08-01 DIAGNOSIS — C7972 Secondary malignant neoplasm of left adrenal gland: Secondary | ICD-10-CM | POA: Diagnosis not present

## 2017-08-01 DIAGNOSIS — C7989 Secondary malignant neoplasm of other specified sites: Secondary | ICD-10-CM | POA: Diagnosis not present

## 2017-08-01 DIAGNOSIS — C50911 Malignant neoplasm of unspecified site of right female breast: Secondary | ICD-10-CM | POA: Diagnosis not present

## 2017-08-01 DIAGNOSIS — C7802 Secondary malignant neoplasm of left lung: Secondary | ICD-10-CM | POA: Diagnosis not present

## 2017-08-03 DIAGNOSIS — C50911 Malignant neoplasm of unspecified site of right female breast: Secondary | ICD-10-CM | POA: Diagnosis not present

## 2017-08-03 DIAGNOSIS — C7989 Secondary malignant neoplasm of other specified sites: Secondary | ICD-10-CM | POA: Diagnosis not present

## 2017-08-03 DIAGNOSIS — C7972 Secondary malignant neoplasm of left adrenal gland: Secondary | ICD-10-CM | POA: Diagnosis not present

## 2017-08-03 DIAGNOSIS — C50812 Malignant neoplasm of overlapping sites of left female breast: Secondary | ICD-10-CM | POA: Diagnosis not present

## 2017-08-03 DIAGNOSIS — C7802 Secondary malignant neoplasm of left lung: Secondary | ICD-10-CM | POA: Diagnosis not present

## 2017-08-03 DIAGNOSIS — C787 Secondary malignant neoplasm of liver and intrahepatic bile duct: Secondary | ICD-10-CM | POA: Diagnosis not present

## 2017-08-06 DIAGNOSIS — C50812 Malignant neoplasm of overlapping sites of left female breast: Secondary | ICD-10-CM | POA: Diagnosis not present

## 2017-08-06 DIAGNOSIS — C50911 Malignant neoplasm of unspecified site of right female breast: Secondary | ICD-10-CM | POA: Diagnosis not present

## 2017-08-06 DIAGNOSIS — C787 Secondary malignant neoplasm of liver and intrahepatic bile duct: Secondary | ICD-10-CM | POA: Diagnosis not present

## 2017-08-06 DIAGNOSIS — C7972 Secondary malignant neoplasm of left adrenal gland: Secondary | ICD-10-CM | POA: Diagnosis not present

## 2017-08-06 DIAGNOSIS — C7989 Secondary malignant neoplasm of other specified sites: Secondary | ICD-10-CM | POA: Diagnosis not present

## 2017-08-06 DIAGNOSIS — C7802 Secondary malignant neoplasm of left lung: Secondary | ICD-10-CM | POA: Diagnosis not present

## 2017-08-08 DIAGNOSIS — C50911 Malignant neoplasm of unspecified site of right female breast: Secondary | ICD-10-CM | POA: Diagnosis not present

## 2017-08-08 DIAGNOSIS — C7972 Secondary malignant neoplasm of left adrenal gland: Secondary | ICD-10-CM | POA: Diagnosis not present

## 2017-08-08 DIAGNOSIS — C7802 Secondary malignant neoplasm of left lung: Secondary | ICD-10-CM | POA: Diagnosis not present

## 2017-08-08 DIAGNOSIS — C50812 Malignant neoplasm of overlapping sites of left female breast: Secondary | ICD-10-CM | POA: Diagnosis not present

## 2017-08-08 DIAGNOSIS — C7989 Secondary malignant neoplasm of other specified sites: Secondary | ICD-10-CM | POA: Diagnosis not present

## 2017-08-08 DIAGNOSIS — C787 Secondary malignant neoplasm of liver and intrahepatic bile duct: Secondary | ICD-10-CM | POA: Diagnosis not present

## 2017-08-13 DIAGNOSIS — C787 Secondary malignant neoplasm of liver and intrahepatic bile duct: Secondary | ICD-10-CM | POA: Diagnosis not present

## 2017-08-13 DIAGNOSIS — C7802 Secondary malignant neoplasm of left lung: Secondary | ICD-10-CM | POA: Diagnosis not present

## 2017-08-13 DIAGNOSIS — C50911 Malignant neoplasm of unspecified site of right female breast: Secondary | ICD-10-CM | POA: Diagnosis not present

## 2017-08-13 DIAGNOSIS — C7989 Secondary malignant neoplasm of other specified sites: Secondary | ICD-10-CM | POA: Diagnosis not present

## 2017-08-13 DIAGNOSIS — C7972 Secondary malignant neoplasm of left adrenal gland: Secondary | ICD-10-CM | POA: Diagnosis not present

## 2017-08-13 DIAGNOSIS — C50812 Malignant neoplasm of overlapping sites of left female breast: Secondary | ICD-10-CM | POA: Diagnosis not present

## 2017-08-16 DIAGNOSIS — C7989 Secondary malignant neoplasm of other specified sites: Secondary | ICD-10-CM | POA: Diagnosis not present

## 2017-08-16 DIAGNOSIS — C787 Secondary malignant neoplasm of liver and intrahepatic bile duct: Secondary | ICD-10-CM | POA: Diagnosis not present

## 2017-08-16 DIAGNOSIS — C7972 Secondary malignant neoplasm of left adrenal gland: Secondary | ICD-10-CM | POA: Diagnosis not present

## 2017-08-16 DIAGNOSIS — C50911 Malignant neoplasm of unspecified site of right female breast: Secondary | ICD-10-CM | POA: Diagnosis not present

## 2017-08-16 DIAGNOSIS — C50812 Malignant neoplasm of overlapping sites of left female breast: Secondary | ICD-10-CM | POA: Diagnosis not present

## 2017-08-16 DIAGNOSIS — C7802 Secondary malignant neoplasm of left lung: Secondary | ICD-10-CM | POA: Diagnosis not present

## 2017-08-22 DIAGNOSIS — C787 Secondary malignant neoplasm of liver and intrahepatic bile duct: Secondary | ICD-10-CM | POA: Diagnosis not present

## 2017-08-22 DIAGNOSIS — C7802 Secondary malignant neoplasm of left lung: Secondary | ICD-10-CM | POA: Diagnosis not present

## 2017-08-22 DIAGNOSIS — C7972 Secondary malignant neoplasm of left adrenal gland: Secondary | ICD-10-CM | POA: Diagnosis not present

## 2017-08-22 DIAGNOSIS — C50812 Malignant neoplasm of overlapping sites of left female breast: Secondary | ICD-10-CM | POA: Diagnosis not present

## 2017-08-22 DIAGNOSIS — C7989 Secondary malignant neoplasm of other specified sites: Secondary | ICD-10-CM | POA: Diagnosis not present

## 2017-08-22 DIAGNOSIS — C50911 Malignant neoplasm of unspecified site of right female breast: Secondary | ICD-10-CM | POA: Diagnosis not present

## 2017-08-29 DIAGNOSIS — C50812 Malignant neoplasm of overlapping sites of left female breast: Secondary | ICD-10-CM | POA: Diagnosis not present

## 2017-08-29 DIAGNOSIS — C50911 Malignant neoplasm of unspecified site of right female breast: Secondary | ICD-10-CM | POA: Diagnosis not present

## 2017-08-29 DIAGNOSIS — C7802 Secondary malignant neoplasm of left lung: Secondary | ICD-10-CM | POA: Diagnosis not present

## 2017-08-29 DIAGNOSIS — C7989 Secondary malignant neoplasm of other specified sites: Secondary | ICD-10-CM | POA: Diagnosis not present

## 2017-08-29 DIAGNOSIS — C787 Secondary malignant neoplasm of liver and intrahepatic bile duct: Secondary | ICD-10-CM | POA: Diagnosis not present

## 2017-08-29 DIAGNOSIS — C7972 Secondary malignant neoplasm of left adrenal gland: Secondary | ICD-10-CM | POA: Diagnosis not present

## 2017-09-04 DIAGNOSIS — C787 Secondary malignant neoplasm of liver and intrahepatic bile duct: Secondary | ICD-10-CM | POA: Diagnosis not present

## 2017-09-04 DIAGNOSIS — C7802 Secondary malignant neoplasm of left lung: Secondary | ICD-10-CM | POA: Diagnosis not present

## 2017-09-04 DIAGNOSIS — C7972 Secondary malignant neoplasm of left adrenal gland: Secondary | ICD-10-CM | POA: Diagnosis not present

## 2017-09-04 DIAGNOSIS — C50911 Malignant neoplasm of unspecified site of right female breast: Secondary | ICD-10-CM | POA: Diagnosis not present

## 2017-09-04 DIAGNOSIS — C7989 Secondary malignant neoplasm of other specified sites: Secondary | ICD-10-CM | POA: Diagnosis not present

## 2017-09-04 DIAGNOSIS — C50812 Malignant neoplasm of overlapping sites of left female breast: Secondary | ICD-10-CM | POA: Diagnosis not present

## 2017-09-05 DIAGNOSIS — C7972 Secondary malignant neoplasm of left adrenal gland: Secondary | ICD-10-CM | POA: Diagnosis not present

## 2017-09-05 DIAGNOSIS — C50911 Malignant neoplasm of unspecified site of right female breast: Secondary | ICD-10-CM | POA: Diagnosis not present

## 2017-09-05 DIAGNOSIS — C7802 Secondary malignant neoplasm of left lung: Secondary | ICD-10-CM | POA: Diagnosis not present

## 2017-09-05 DIAGNOSIS — C787 Secondary malignant neoplasm of liver and intrahepatic bile duct: Secondary | ICD-10-CM | POA: Diagnosis not present

## 2017-09-05 DIAGNOSIS — C7989 Secondary malignant neoplasm of other specified sites: Secondary | ICD-10-CM | POA: Diagnosis not present

## 2017-09-05 DIAGNOSIS — C50812 Malignant neoplasm of overlapping sites of left female breast: Secondary | ICD-10-CM | POA: Diagnosis not present

## 2017-09-11 DIAGNOSIS — C7989 Secondary malignant neoplasm of other specified sites: Secondary | ICD-10-CM | POA: Diagnosis not present

## 2017-09-11 DIAGNOSIS — C7802 Secondary malignant neoplasm of left lung: Secondary | ICD-10-CM | POA: Diagnosis not present

## 2017-09-11 DIAGNOSIS — C50812 Malignant neoplasm of overlapping sites of left female breast: Secondary | ICD-10-CM | POA: Diagnosis not present

## 2017-09-11 DIAGNOSIS — C7972 Secondary malignant neoplasm of left adrenal gland: Secondary | ICD-10-CM | POA: Diagnosis not present

## 2017-09-11 DIAGNOSIS — C787 Secondary malignant neoplasm of liver and intrahepatic bile duct: Secondary | ICD-10-CM | POA: Diagnosis not present

## 2017-09-11 DIAGNOSIS — C50911 Malignant neoplasm of unspecified site of right female breast: Secondary | ICD-10-CM | POA: Diagnosis not present

## 2017-09-12 DIAGNOSIS — C50812 Malignant neoplasm of overlapping sites of left female breast: Secondary | ICD-10-CM | POA: Diagnosis not present

## 2017-09-12 DIAGNOSIS — C787 Secondary malignant neoplasm of liver and intrahepatic bile duct: Secondary | ICD-10-CM | POA: Diagnosis not present

## 2017-09-12 DIAGNOSIS — C7802 Secondary malignant neoplasm of left lung: Secondary | ICD-10-CM | POA: Diagnosis not present

## 2017-09-12 DIAGNOSIS — C7972 Secondary malignant neoplasm of left adrenal gland: Secondary | ICD-10-CM | POA: Diagnosis not present

## 2017-09-12 DIAGNOSIS — C7989 Secondary malignant neoplasm of other specified sites: Secondary | ICD-10-CM | POA: Diagnosis not present

## 2017-09-12 DIAGNOSIS — C50911 Malignant neoplasm of unspecified site of right female breast: Secondary | ICD-10-CM | POA: Diagnosis not present

## 2017-09-14 DIAGNOSIS — C50911 Malignant neoplasm of unspecified site of right female breast: Secondary | ICD-10-CM | POA: Diagnosis not present

## 2017-09-14 DIAGNOSIS — C7802 Secondary malignant neoplasm of left lung: Secondary | ICD-10-CM | POA: Diagnosis not present

## 2017-09-14 DIAGNOSIS — C787 Secondary malignant neoplasm of liver and intrahepatic bile duct: Secondary | ICD-10-CM | POA: Diagnosis not present

## 2017-09-14 DIAGNOSIS — C7989 Secondary malignant neoplasm of other specified sites: Secondary | ICD-10-CM | POA: Diagnosis not present

## 2017-09-14 DIAGNOSIS — C7972 Secondary malignant neoplasm of left adrenal gland: Secondary | ICD-10-CM | POA: Diagnosis not present

## 2017-09-14 DIAGNOSIS — C50812 Malignant neoplasm of overlapping sites of left female breast: Secondary | ICD-10-CM | POA: Diagnosis not present

## 2017-09-15 DIAGNOSIS — C7989 Secondary malignant neoplasm of other specified sites: Secondary | ICD-10-CM | POA: Diagnosis not present

## 2017-09-15 DIAGNOSIS — R443 Hallucinations, unspecified: Secondary | ICD-10-CM | POA: Diagnosis not present

## 2017-09-15 DIAGNOSIS — J90 Pleural effusion, not elsewhere classified: Secondary | ICD-10-CM | POA: Diagnosis not present

## 2017-09-15 DIAGNOSIS — R410 Disorientation, unspecified: Secondary | ICD-10-CM | POA: Diagnosis not present

## 2017-09-15 DIAGNOSIS — Z9181 History of falling: Secondary | ICD-10-CM | POA: Diagnosis not present

## 2017-09-15 DIAGNOSIS — Z85820 Personal history of malignant melanoma of skin: Secondary | ICD-10-CM | POA: Diagnosis not present

## 2017-09-15 DIAGNOSIS — C787 Secondary malignant neoplasm of liver and intrahepatic bile duct: Secondary | ICD-10-CM | POA: Diagnosis not present

## 2017-09-15 DIAGNOSIS — F329 Major depressive disorder, single episode, unspecified: Secondary | ICD-10-CM | POA: Diagnosis not present

## 2017-09-15 DIAGNOSIS — C7972 Secondary malignant neoplasm of left adrenal gland: Secondary | ICD-10-CM | POA: Diagnosis not present

## 2017-09-15 DIAGNOSIS — F419 Anxiety disorder, unspecified: Secondary | ICD-10-CM | POA: Diagnosis not present

## 2017-09-15 DIAGNOSIS — J439 Emphysema, unspecified: Secondary | ICD-10-CM | POA: Diagnosis not present

## 2017-09-15 DIAGNOSIS — N182 Chronic kidney disease, stage 2 (mild): Secondary | ICD-10-CM | POA: Diagnosis not present

## 2017-09-15 DIAGNOSIS — C50812 Malignant neoplasm of overlapping sites of left female breast: Secondary | ICD-10-CM | POA: Diagnosis not present

## 2017-09-15 DIAGNOSIS — I120 Hypertensive chronic kidney disease with stage 5 chronic kidney disease or end stage renal disease: Secondary | ICD-10-CM | POA: Diagnosis not present

## 2017-09-15 DIAGNOSIS — C50911 Malignant neoplasm of unspecified site of right female breast: Secondary | ICD-10-CM | POA: Diagnosis not present

## 2017-09-15 DIAGNOSIS — C778 Secondary and unspecified malignant neoplasm of lymph nodes of multiple regions: Secondary | ICD-10-CM | POA: Diagnosis not present

## 2017-09-15 DIAGNOSIS — C7802 Secondary malignant neoplasm of left lung: Secondary | ICD-10-CM | POA: Diagnosis not present

## 2017-09-15 DIAGNOSIS — F17210 Nicotine dependence, cigarettes, uncomplicated: Secondary | ICD-10-CM | POA: Diagnosis not present

## 2017-09-15 DIAGNOSIS — R32 Unspecified urinary incontinence: Secondary | ICD-10-CM | POA: Diagnosis not present

## 2017-09-15 DIAGNOSIS — R634 Abnormal weight loss: Secondary | ICD-10-CM | POA: Diagnosis not present

## 2017-09-17 DIAGNOSIS — C787 Secondary malignant neoplasm of liver and intrahepatic bile duct: Secondary | ICD-10-CM | POA: Diagnosis not present

## 2017-09-17 DIAGNOSIS — C50911 Malignant neoplasm of unspecified site of right female breast: Secondary | ICD-10-CM | POA: Diagnosis not present

## 2017-09-17 DIAGNOSIS — C7972 Secondary malignant neoplasm of left adrenal gland: Secondary | ICD-10-CM | POA: Diagnosis not present

## 2017-09-17 DIAGNOSIS — C50812 Malignant neoplasm of overlapping sites of left female breast: Secondary | ICD-10-CM | POA: Diagnosis not present

## 2017-09-17 DIAGNOSIS — C7802 Secondary malignant neoplasm of left lung: Secondary | ICD-10-CM | POA: Diagnosis not present

## 2017-09-17 DIAGNOSIS — C7989 Secondary malignant neoplasm of other specified sites: Secondary | ICD-10-CM | POA: Diagnosis not present

## 2017-09-18 DIAGNOSIS — C7802 Secondary malignant neoplasm of left lung: Secondary | ICD-10-CM | POA: Diagnosis not present

## 2017-09-18 DIAGNOSIS — C7989 Secondary malignant neoplasm of other specified sites: Secondary | ICD-10-CM | POA: Diagnosis not present

## 2017-09-18 DIAGNOSIS — C7972 Secondary malignant neoplasm of left adrenal gland: Secondary | ICD-10-CM | POA: Diagnosis not present

## 2017-09-18 DIAGNOSIS — C787 Secondary malignant neoplasm of liver and intrahepatic bile duct: Secondary | ICD-10-CM | POA: Diagnosis not present

## 2017-09-18 DIAGNOSIS — C50911 Malignant neoplasm of unspecified site of right female breast: Secondary | ICD-10-CM | POA: Diagnosis not present

## 2017-09-18 DIAGNOSIS — C50812 Malignant neoplasm of overlapping sites of left female breast: Secondary | ICD-10-CM | POA: Diagnosis not present

## 2017-09-20 DIAGNOSIS — C7989 Secondary malignant neoplasm of other specified sites: Secondary | ICD-10-CM | POA: Diagnosis not present

## 2017-09-20 DIAGNOSIS — C787 Secondary malignant neoplasm of liver and intrahepatic bile duct: Secondary | ICD-10-CM | POA: Diagnosis not present

## 2017-09-20 DIAGNOSIS — C7972 Secondary malignant neoplasm of left adrenal gland: Secondary | ICD-10-CM | POA: Diagnosis not present

## 2017-09-20 DIAGNOSIS — C50812 Malignant neoplasm of overlapping sites of left female breast: Secondary | ICD-10-CM | POA: Diagnosis not present

## 2017-09-20 DIAGNOSIS — C7802 Secondary malignant neoplasm of left lung: Secondary | ICD-10-CM | POA: Diagnosis not present

## 2017-09-20 DIAGNOSIS — C50911 Malignant neoplasm of unspecified site of right female breast: Secondary | ICD-10-CM | POA: Diagnosis not present

## 2017-09-25 DIAGNOSIS — C7802 Secondary malignant neoplasm of left lung: Secondary | ICD-10-CM | POA: Diagnosis not present

## 2017-09-25 DIAGNOSIS — C7989 Secondary malignant neoplasm of other specified sites: Secondary | ICD-10-CM | POA: Diagnosis not present

## 2017-09-25 DIAGNOSIS — C50812 Malignant neoplasm of overlapping sites of left female breast: Secondary | ICD-10-CM | POA: Diagnosis not present

## 2017-09-25 DIAGNOSIS — C7972 Secondary malignant neoplasm of left adrenal gland: Secondary | ICD-10-CM | POA: Diagnosis not present

## 2017-09-25 DIAGNOSIS — C50911 Malignant neoplasm of unspecified site of right female breast: Secondary | ICD-10-CM | POA: Diagnosis not present

## 2017-09-25 DIAGNOSIS — C787 Secondary malignant neoplasm of liver and intrahepatic bile duct: Secondary | ICD-10-CM | POA: Diagnosis not present

## 2017-09-29 DIAGNOSIS — C787 Secondary malignant neoplasm of liver and intrahepatic bile duct: Secondary | ICD-10-CM | POA: Diagnosis not present

## 2017-09-29 DIAGNOSIS — C7989 Secondary malignant neoplasm of other specified sites: Secondary | ICD-10-CM | POA: Diagnosis not present

## 2017-09-29 DIAGNOSIS — C7802 Secondary malignant neoplasm of left lung: Secondary | ICD-10-CM | POA: Diagnosis not present

## 2017-09-29 DIAGNOSIS — C50911 Malignant neoplasm of unspecified site of right female breast: Secondary | ICD-10-CM | POA: Diagnosis not present

## 2017-09-29 DIAGNOSIS — C50812 Malignant neoplasm of overlapping sites of left female breast: Secondary | ICD-10-CM | POA: Diagnosis not present

## 2017-09-29 DIAGNOSIS — C7972 Secondary malignant neoplasm of left adrenal gland: Secondary | ICD-10-CM | POA: Diagnosis not present

## 2017-10-02 DIAGNOSIS — C787 Secondary malignant neoplasm of liver and intrahepatic bile duct: Secondary | ICD-10-CM | POA: Diagnosis not present

## 2017-10-02 DIAGNOSIS — C50911 Malignant neoplasm of unspecified site of right female breast: Secondary | ICD-10-CM | POA: Diagnosis not present

## 2017-10-02 DIAGNOSIS — C7802 Secondary malignant neoplasm of left lung: Secondary | ICD-10-CM | POA: Diagnosis not present

## 2017-10-02 DIAGNOSIS — C7989 Secondary malignant neoplasm of other specified sites: Secondary | ICD-10-CM | POA: Diagnosis not present

## 2017-10-02 DIAGNOSIS — C50812 Malignant neoplasm of overlapping sites of left female breast: Secondary | ICD-10-CM | POA: Diagnosis not present

## 2017-10-02 DIAGNOSIS — C7972 Secondary malignant neoplasm of left adrenal gland: Secondary | ICD-10-CM | POA: Diagnosis not present

## 2017-10-04 DIAGNOSIS — C787 Secondary malignant neoplasm of liver and intrahepatic bile duct: Secondary | ICD-10-CM | POA: Diagnosis not present

## 2017-10-04 DIAGNOSIS — C7802 Secondary malignant neoplasm of left lung: Secondary | ICD-10-CM | POA: Diagnosis not present

## 2017-10-04 DIAGNOSIS — C7989 Secondary malignant neoplasm of other specified sites: Secondary | ICD-10-CM | POA: Diagnosis not present

## 2017-10-04 DIAGNOSIS — C50911 Malignant neoplasm of unspecified site of right female breast: Secondary | ICD-10-CM | POA: Diagnosis not present

## 2017-10-04 DIAGNOSIS — C7972 Secondary malignant neoplasm of left adrenal gland: Secondary | ICD-10-CM | POA: Diagnosis not present

## 2017-10-04 DIAGNOSIS — C50812 Malignant neoplasm of overlapping sites of left female breast: Secondary | ICD-10-CM | POA: Diagnosis not present

## 2017-10-06 DIAGNOSIS — C7989 Secondary malignant neoplasm of other specified sites: Secondary | ICD-10-CM | POA: Diagnosis not present

## 2017-10-06 DIAGNOSIS — R410 Disorientation, unspecified: Secondary | ICD-10-CM | POA: Diagnosis not present

## 2017-10-06 DIAGNOSIS — J439 Emphysema, unspecified: Secondary | ICD-10-CM | POA: Diagnosis not present

## 2017-10-06 DIAGNOSIS — R443 Hallucinations, unspecified: Secondary | ICD-10-CM | POA: Diagnosis not present

## 2017-10-06 DIAGNOSIS — Z9181 History of falling: Secondary | ICD-10-CM | POA: Diagnosis not present

## 2017-10-06 DIAGNOSIS — N182 Chronic kidney disease, stage 2 (mild): Secondary | ICD-10-CM | POA: Diagnosis not present

## 2017-10-06 DIAGNOSIS — R32 Unspecified urinary incontinence: Secondary | ICD-10-CM | POA: Diagnosis not present

## 2017-10-06 DIAGNOSIS — I120 Hypertensive chronic kidney disease with stage 5 chronic kidney disease or end stage renal disease: Secondary | ICD-10-CM | POA: Diagnosis not present

## 2017-10-06 DIAGNOSIS — Z85820 Personal history of malignant melanoma of skin: Secondary | ICD-10-CM | POA: Diagnosis not present

## 2017-10-06 DIAGNOSIS — Z853 Personal history of malignant neoplasm of breast: Secondary | ICD-10-CM | POA: Diagnosis not present

## 2017-10-06 DIAGNOSIS — C787 Secondary malignant neoplasm of liver and intrahepatic bile duct: Secondary | ICD-10-CM | POA: Diagnosis not present

## 2017-10-06 DIAGNOSIS — C50911 Malignant neoplasm of unspecified site of right female breast: Secondary | ICD-10-CM | POA: Diagnosis not present

## 2017-10-06 DIAGNOSIS — C7802 Secondary malignant neoplasm of left lung: Secondary | ICD-10-CM | POA: Diagnosis not present

## 2017-10-06 DIAGNOSIS — C778 Secondary and unspecified malignant neoplasm of lymph nodes of multiple regions: Secondary | ICD-10-CM | POA: Diagnosis not present

## 2017-10-06 DIAGNOSIS — C50812 Malignant neoplasm of overlapping sites of left female breast: Secondary | ICD-10-CM | POA: Diagnosis not present

## 2017-10-06 DIAGNOSIS — F329 Major depressive disorder, single episode, unspecified: Secondary | ICD-10-CM | POA: Diagnosis not present

## 2017-10-06 DIAGNOSIS — F17210 Nicotine dependence, cigarettes, uncomplicated: Secondary | ICD-10-CM | POA: Diagnosis not present

## 2017-10-06 DIAGNOSIS — C7972 Secondary malignant neoplasm of left adrenal gland: Secondary | ICD-10-CM | POA: Diagnosis not present

## 2017-10-06 DIAGNOSIS — S0990XA Unspecified injury of head, initial encounter: Secondary | ICD-10-CM | POA: Diagnosis not present

## 2017-10-06 DIAGNOSIS — J90 Pleural effusion, not elsewhere classified: Secondary | ICD-10-CM | POA: Diagnosis not present

## 2017-10-06 DIAGNOSIS — R634 Abnormal weight loss: Secondary | ICD-10-CM | POA: Diagnosis not present

## 2017-10-06 DIAGNOSIS — W1830XA Fall on same level, unspecified, initial encounter: Secondary | ICD-10-CM | POA: Diagnosis not present

## 2017-10-06 DIAGNOSIS — F419 Anxiety disorder, unspecified: Secondary | ICD-10-CM | POA: Diagnosis not present

## 2017-10-10 DIAGNOSIS — W19XXXA Unspecified fall, initial encounter: Secondary | ICD-10-CM | POA: Diagnosis not present

## 2017-10-10 DIAGNOSIS — C50919 Malignant neoplasm of unspecified site of unspecified female breast: Secondary | ICD-10-CM | POA: Diagnosis not present

## 2017-10-10 DIAGNOSIS — R402441 Other coma, without documented Glasgow coma scale score, or with partial score reported, in the field [EMT or ambulance]: Secondary | ICD-10-CM | POA: Diagnosis not present

## 2017-10-10 DIAGNOSIS — Z853 Personal history of malignant neoplasm of breast: Secondary | ICD-10-CM | POA: Diagnosis not present

## 2017-10-10 DIAGNOSIS — R55 Syncope and collapse: Secondary | ICD-10-CM | POA: Diagnosis not present

## 2017-10-10 DIAGNOSIS — G893 Neoplasm related pain (acute) (chronic): Secondary | ICD-10-CM | POA: Diagnosis not present

## 2017-10-10 DIAGNOSIS — E039 Hypothyroidism, unspecified: Secondary | ICD-10-CM | POA: Diagnosis not present

## 2017-10-10 DIAGNOSIS — R634 Abnormal weight loss: Secondary | ICD-10-CM | POA: Diagnosis not present

## 2017-10-10 DIAGNOSIS — W1830XA Fall on same level, unspecified, initial encounter: Secondary | ICD-10-CM | POA: Diagnosis not present

## 2017-10-10 DIAGNOSIS — S0990XA Unspecified injury of head, initial encounter: Secondary | ICD-10-CM | POA: Diagnosis not present

## 2017-10-10 DIAGNOSIS — R0902 Hypoxemia: Secondary | ICD-10-CM | POA: Diagnosis not present

## 2017-10-10 DIAGNOSIS — I499 Cardiac arrhythmia, unspecified: Secondary | ICD-10-CM | POA: Diagnosis not present

## 2017-10-11 DIAGNOSIS — G893 Neoplasm related pain (acute) (chronic): Secondary | ICD-10-CM | POA: Diagnosis not present

## 2017-10-11 DIAGNOSIS — Z515 Encounter for palliative care: Secondary | ICD-10-CM | POA: Diagnosis not present

## 2017-10-11 DIAGNOSIS — R634 Abnormal weight loss: Secondary | ICD-10-CM | POA: Diagnosis not present

## 2017-10-11 DIAGNOSIS — R636 Underweight: Secondary | ICD-10-CM | POA: Diagnosis not present

## 2017-10-11 DIAGNOSIS — E039 Hypothyroidism, unspecified: Secondary | ICD-10-CM | POA: Diagnosis not present

## 2017-10-11 DIAGNOSIS — C50919 Malignant neoplasm of unspecified site of unspecified female breast: Secondary | ICD-10-CM | POA: Diagnosis not present

## 2017-10-11 DIAGNOSIS — R55 Syncope and collapse: Secondary | ICD-10-CM | POA: Diagnosis not present

## 2017-10-12 DIAGNOSIS — R636 Underweight: Secondary | ICD-10-CM | POA: Diagnosis not present

## 2017-10-12 DIAGNOSIS — C50919 Malignant neoplasm of unspecified site of unspecified female breast: Secondary | ICD-10-CM | POA: Diagnosis not present

## 2017-10-12 DIAGNOSIS — E039 Hypothyroidism, unspecified: Secondary | ICD-10-CM | POA: Diagnosis not present

## 2017-10-12 DIAGNOSIS — J3489 Other specified disorders of nose and nasal sinuses: Secondary | ICD-10-CM | POA: Diagnosis not present

## 2017-10-12 DIAGNOSIS — R55 Syncope and collapse: Secondary | ICD-10-CM | POA: Diagnosis not present

## 2017-10-12 DIAGNOSIS — R634 Abnormal weight loss: Secondary | ICD-10-CM | POA: Diagnosis not present

## 2017-10-12 DIAGNOSIS — Z515 Encounter for palliative care: Secondary | ICD-10-CM | POA: Diagnosis not present

## 2017-10-12 DIAGNOSIS — W19XXXD Unspecified fall, subsequent encounter: Secondary | ICD-10-CM | POA: Diagnosis not present

## 2017-10-13 DIAGNOSIS — W19XXXD Unspecified fall, subsequent encounter: Secondary | ICD-10-CM | POA: Diagnosis not present

## 2017-10-13 DIAGNOSIS — E039 Hypothyroidism, unspecified: Secondary | ICD-10-CM | POA: Diagnosis not present

## 2017-10-13 DIAGNOSIS — R634 Abnormal weight loss: Secondary | ICD-10-CM | POA: Diagnosis not present

## 2017-10-13 DIAGNOSIS — J3489 Other specified disorders of nose and nasal sinuses: Secondary | ICD-10-CM | POA: Diagnosis not present

## 2017-10-13 DIAGNOSIS — R55 Syncope and collapse: Secondary | ICD-10-CM | POA: Diagnosis not present

## 2017-10-13 DIAGNOSIS — R636 Underweight: Secondary | ICD-10-CM | POA: Diagnosis not present

## 2017-10-13 DIAGNOSIS — Z515 Encounter for palliative care: Secondary | ICD-10-CM | POA: Diagnosis not present

## 2017-10-13 DIAGNOSIS — C50919 Malignant neoplasm of unspecified site of unspecified female breast: Secondary | ICD-10-CM | POA: Diagnosis not present

## 2017-10-14 DIAGNOSIS — J3489 Other specified disorders of nose and nasal sinuses: Secondary | ICD-10-CM | POA: Diagnosis not present

## 2017-10-14 DIAGNOSIS — R55 Syncope and collapse: Secondary | ICD-10-CM | POA: Diagnosis not present

## 2017-10-14 DIAGNOSIS — E039 Hypothyroidism, unspecified: Secondary | ICD-10-CM | POA: Diagnosis not present

## 2017-10-14 DIAGNOSIS — R636 Underweight: Secondary | ICD-10-CM | POA: Diagnosis not present

## 2017-10-14 DIAGNOSIS — C50919 Malignant neoplasm of unspecified site of unspecified female breast: Secondary | ICD-10-CM | POA: Diagnosis not present

## 2017-10-14 DIAGNOSIS — R634 Abnormal weight loss: Secondary | ICD-10-CM | POA: Diagnosis not present

## 2017-10-14 DIAGNOSIS — W19XXXD Unspecified fall, subsequent encounter: Secondary | ICD-10-CM | POA: Diagnosis not present

## 2017-10-14 DIAGNOSIS — Z515 Encounter for palliative care: Secondary | ICD-10-CM | POA: Diagnosis not present

## 2017-10-15 DIAGNOSIS — R55 Syncope and collapse: Secondary | ICD-10-CM | POA: Diagnosis not present

## 2017-10-15 DIAGNOSIS — W19XXXD Unspecified fall, subsequent encounter: Secondary | ICD-10-CM | POA: Diagnosis not present

## 2017-10-15 DIAGNOSIS — C50919 Malignant neoplasm of unspecified site of unspecified female breast: Secondary | ICD-10-CM | POA: Diagnosis not present

## 2017-10-15 DIAGNOSIS — E039 Hypothyroidism, unspecified: Secondary | ICD-10-CM | POA: Diagnosis not present

## 2017-10-15 DIAGNOSIS — R634 Abnormal weight loss: Secondary | ICD-10-CM | POA: Diagnosis not present

## 2017-10-15 DIAGNOSIS — J3489 Other specified disorders of nose and nasal sinuses: Secondary | ICD-10-CM | POA: Diagnosis not present

## 2017-10-15 DIAGNOSIS — R636 Underweight: Secondary | ICD-10-CM | POA: Diagnosis not present

## 2017-10-15 DIAGNOSIS — Z515 Encounter for palliative care: Secondary | ICD-10-CM | POA: Diagnosis not present

## 2017-10-16 DIAGNOSIS — R634 Abnormal weight loss: Secondary | ICD-10-CM | POA: Diagnosis not present

## 2017-10-16 DIAGNOSIS — Z515 Encounter for palliative care: Secondary | ICD-10-CM | POA: Diagnosis not present

## 2017-10-16 DIAGNOSIS — R55 Syncope and collapse: Secondary | ICD-10-CM | POA: Diagnosis not present

## 2017-10-16 DIAGNOSIS — C50919 Malignant neoplasm of unspecified site of unspecified female breast: Secondary | ICD-10-CM | POA: Diagnosis not present

## 2017-10-16 DIAGNOSIS — J3489 Other specified disorders of nose and nasal sinuses: Secondary | ICD-10-CM | POA: Diagnosis not present

## 2017-10-16 DIAGNOSIS — W19XXXD Unspecified fall, subsequent encounter: Secondary | ICD-10-CM | POA: Diagnosis not present

## 2017-10-16 DIAGNOSIS — R636 Underweight: Secondary | ICD-10-CM | POA: Diagnosis not present

## 2017-10-16 DIAGNOSIS — E039 Hypothyroidism, unspecified: Secondary | ICD-10-CM | POA: Diagnosis not present

## 2017-10-17 DIAGNOSIS — W19XXXD Unspecified fall, subsequent encounter: Secondary | ICD-10-CM | POA: Diagnosis not present

## 2017-10-17 DIAGNOSIS — E039 Hypothyroidism, unspecified: Secondary | ICD-10-CM | POA: Diagnosis not present

## 2017-10-17 DIAGNOSIS — Z515 Encounter for palliative care: Secondary | ICD-10-CM | POA: Diagnosis not present

## 2017-10-17 DIAGNOSIS — R636 Underweight: Secondary | ICD-10-CM | POA: Diagnosis not present

## 2017-10-17 DIAGNOSIS — J3489 Other specified disorders of nose and nasal sinuses: Secondary | ICD-10-CM | POA: Diagnosis not present

## 2017-10-17 DIAGNOSIS — R634 Abnormal weight loss: Secondary | ICD-10-CM | POA: Diagnosis not present

## 2017-10-17 DIAGNOSIS — R55 Syncope and collapse: Secondary | ICD-10-CM | POA: Diagnosis not present

## 2017-10-17 DIAGNOSIS — C50919 Malignant neoplasm of unspecified site of unspecified female breast: Secondary | ICD-10-CM | POA: Diagnosis not present

## 2017-10-18 DIAGNOSIS — W19XXXD Unspecified fall, subsequent encounter: Secondary | ICD-10-CM | POA: Diagnosis not present

## 2017-10-18 DIAGNOSIS — R636 Underweight: Secondary | ICD-10-CM | POA: Diagnosis not present

## 2017-10-18 DIAGNOSIS — R634 Abnormal weight loss: Secondary | ICD-10-CM | POA: Diagnosis not present

## 2017-10-18 DIAGNOSIS — E039 Hypothyroidism, unspecified: Secondary | ICD-10-CM | POA: Diagnosis not present

## 2017-10-18 DIAGNOSIS — C50919 Malignant neoplasm of unspecified site of unspecified female breast: Secondary | ICD-10-CM | POA: Diagnosis not present

## 2017-10-18 DIAGNOSIS — R55 Syncope and collapse: Secondary | ICD-10-CM | POA: Diagnosis not present

## 2017-10-18 DIAGNOSIS — J3489 Other specified disorders of nose and nasal sinuses: Secondary | ICD-10-CM | POA: Diagnosis not present

## 2017-10-18 DIAGNOSIS — Z515 Encounter for palliative care: Secondary | ICD-10-CM | POA: Diagnosis not present

## 2017-10-19 DIAGNOSIS — C50919 Malignant neoplasm of unspecified site of unspecified female breast: Secondary | ICD-10-CM | POA: Diagnosis not present

## 2017-10-19 DIAGNOSIS — R634 Abnormal weight loss: Secondary | ICD-10-CM | POA: Diagnosis not present

## 2017-10-19 DIAGNOSIS — E039 Hypothyroidism, unspecified: Secondary | ICD-10-CM | POA: Diagnosis not present

## 2017-10-19 DIAGNOSIS — R55 Syncope and collapse: Secondary | ICD-10-CM | POA: Diagnosis not present

## 2017-10-19 DIAGNOSIS — Z515 Encounter for palliative care: Secondary | ICD-10-CM | POA: Diagnosis not present

## 2017-10-19 DIAGNOSIS — R636 Underweight: Secondary | ICD-10-CM | POA: Diagnosis not present

## 2017-10-19 DIAGNOSIS — W19XXXD Unspecified fall, subsequent encounter: Secondary | ICD-10-CM | POA: Diagnosis not present

## 2017-10-19 DIAGNOSIS — J3489 Other specified disorders of nose and nasal sinuses: Secondary | ICD-10-CM | POA: Diagnosis not present

## 2017-10-20 DIAGNOSIS — C50919 Malignant neoplasm of unspecified site of unspecified female breast: Secondary | ICD-10-CM | POA: Diagnosis not present

## 2017-10-20 DIAGNOSIS — R55 Syncope and collapse: Secondary | ICD-10-CM | POA: Diagnosis not present

## 2017-10-20 DIAGNOSIS — E039 Hypothyroidism, unspecified: Secondary | ICD-10-CM | POA: Diagnosis not present

## 2017-10-20 DIAGNOSIS — R634 Abnormal weight loss: Secondary | ICD-10-CM | POA: Diagnosis not present

## 2017-11-05 DIAGNOSIS — C7989 Secondary malignant neoplasm of other specified sites: Secondary | ICD-10-CM | POA: Diagnosis not present

## 2017-11-05 DIAGNOSIS — C50911 Malignant neoplasm of unspecified site of right female breast: Secondary | ICD-10-CM | POA: Diagnosis not present

## 2017-11-05 DIAGNOSIS — F419 Anxiety disorder, unspecified: Secondary | ICD-10-CM | POA: Diagnosis not present

## 2017-11-05 DIAGNOSIS — J90 Pleural effusion, not elsewhere classified: Secondary | ICD-10-CM | POA: Diagnosis not present

## 2017-11-05 DIAGNOSIS — R634 Abnormal weight loss: Secondary | ICD-10-CM | POA: Diagnosis not present

## 2017-11-05 DIAGNOSIS — Z9181 History of falling: Secondary | ICD-10-CM | POA: Diagnosis not present

## 2017-11-05 DIAGNOSIS — C7802 Secondary malignant neoplasm of left lung: Secondary | ICD-10-CM | POA: Diagnosis not present

## 2017-11-05 DIAGNOSIS — J439 Emphysema, unspecified: Secondary | ICD-10-CM | POA: Diagnosis not present

## 2017-11-05 DIAGNOSIS — F17210 Nicotine dependence, cigarettes, uncomplicated: Secondary | ICD-10-CM | POA: Diagnosis not present

## 2017-11-05 DIAGNOSIS — C778 Secondary and unspecified malignant neoplasm of lymph nodes of multiple regions: Secondary | ICD-10-CM | POA: Diagnosis not present

## 2017-11-05 DIAGNOSIS — R32 Unspecified urinary incontinence: Secondary | ICD-10-CM | POA: Diagnosis not present

## 2017-11-05 DIAGNOSIS — R410 Disorientation, unspecified: Secondary | ICD-10-CM | POA: Diagnosis not present

## 2017-11-05 DIAGNOSIS — C50812 Malignant neoplasm of overlapping sites of left female breast: Secondary | ICD-10-CM | POA: Diagnosis not present

## 2017-11-05 DIAGNOSIS — R443 Hallucinations, unspecified: Secondary | ICD-10-CM | POA: Diagnosis not present

## 2017-11-05 DIAGNOSIS — I120 Hypertensive chronic kidney disease with stage 5 chronic kidney disease or end stage renal disease: Secondary | ICD-10-CM | POA: Diagnosis not present

## 2017-11-05 DIAGNOSIS — Z85820 Personal history of malignant melanoma of skin: Secondary | ICD-10-CM | POA: Diagnosis not present

## 2017-11-05 DIAGNOSIS — C7972 Secondary malignant neoplasm of left adrenal gland: Secondary | ICD-10-CM | POA: Diagnosis not present

## 2017-11-05 DIAGNOSIS — N182 Chronic kidney disease, stage 2 (mild): Secondary | ICD-10-CM | POA: Diagnosis not present

## 2017-11-05 DIAGNOSIS — F329 Major depressive disorder, single episode, unspecified: Secondary | ICD-10-CM | POA: Diagnosis not present

## 2017-11-05 DIAGNOSIS — C787 Secondary malignant neoplasm of liver and intrahepatic bile duct: Secondary | ICD-10-CM | POA: Diagnosis not present

## 2017-11-06 DIAGNOSIS — C7802 Secondary malignant neoplasm of left lung: Secondary | ICD-10-CM | POA: Diagnosis not present

## 2017-11-06 DIAGNOSIS — C7972 Secondary malignant neoplasm of left adrenal gland: Secondary | ICD-10-CM | POA: Diagnosis not present

## 2017-11-06 DIAGNOSIS — C7989 Secondary malignant neoplasm of other specified sites: Secondary | ICD-10-CM | POA: Diagnosis not present

## 2017-11-06 DIAGNOSIS — C787 Secondary malignant neoplasm of liver and intrahepatic bile duct: Secondary | ICD-10-CM | POA: Diagnosis not present

## 2017-11-06 DIAGNOSIS — C50911 Malignant neoplasm of unspecified site of right female breast: Secondary | ICD-10-CM | POA: Diagnosis not present

## 2017-11-06 DIAGNOSIS — C50812 Malignant neoplasm of overlapping sites of left female breast: Secondary | ICD-10-CM | POA: Diagnosis not present

## 2017-11-08 DIAGNOSIS — C50812 Malignant neoplasm of overlapping sites of left female breast: Secondary | ICD-10-CM | POA: Diagnosis not present

## 2017-11-08 DIAGNOSIS — C7972 Secondary malignant neoplasm of left adrenal gland: Secondary | ICD-10-CM | POA: Diagnosis not present

## 2017-11-08 DIAGNOSIS — C7989 Secondary malignant neoplasm of other specified sites: Secondary | ICD-10-CM | POA: Diagnosis not present

## 2017-11-08 DIAGNOSIS — C787 Secondary malignant neoplasm of liver and intrahepatic bile duct: Secondary | ICD-10-CM | POA: Diagnosis not present

## 2017-11-08 DIAGNOSIS — C50911 Malignant neoplasm of unspecified site of right female breast: Secondary | ICD-10-CM | POA: Diagnosis not present

## 2017-11-08 DIAGNOSIS — C7802 Secondary malignant neoplasm of left lung: Secondary | ICD-10-CM | POA: Diagnosis not present

## 2017-11-09 DIAGNOSIS — C50911 Malignant neoplasm of unspecified site of right female breast: Secondary | ICD-10-CM | POA: Diagnosis not present

## 2017-11-09 DIAGNOSIS — C7989 Secondary malignant neoplasm of other specified sites: Secondary | ICD-10-CM | POA: Diagnosis not present

## 2017-11-09 DIAGNOSIS — C7802 Secondary malignant neoplasm of left lung: Secondary | ICD-10-CM | POA: Diagnosis not present

## 2017-11-09 DIAGNOSIS — C7972 Secondary malignant neoplasm of left adrenal gland: Secondary | ICD-10-CM | POA: Diagnosis not present

## 2017-11-09 DIAGNOSIS — C50812 Malignant neoplasm of overlapping sites of left female breast: Secondary | ICD-10-CM | POA: Diagnosis not present

## 2017-11-09 DIAGNOSIS — C787 Secondary malignant neoplasm of liver and intrahepatic bile duct: Secondary | ICD-10-CM | POA: Diagnosis not present

## 2017-11-13 DIAGNOSIS — C7989 Secondary malignant neoplasm of other specified sites: Secondary | ICD-10-CM | POA: Diagnosis not present

## 2017-11-13 DIAGNOSIS — C50911 Malignant neoplasm of unspecified site of right female breast: Secondary | ICD-10-CM | POA: Diagnosis not present

## 2017-11-13 DIAGNOSIS — C787 Secondary malignant neoplasm of liver and intrahepatic bile duct: Secondary | ICD-10-CM | POA: Diagnosis not present

## 2017-11-13 DIAGNOSIS — C7972 Secondary malignant neoplasm of left adrenal gland: Secondary | ICD-10-CM | POA: Diagnosis not present

## 2017-11-13 DIAGNOSIS — C7802 Secondary malignant neoplasm of left lung: Secondary | ICD-10-CM | POA: Diagnosis not present

## 2017-11-13 DIAGNOSIS — C50812 Malignant neoplasm of overlapping sites of left female breast: Secondary | ICD-10-CM | POA: Diagnosis not present

## 2017-11-15 DIAGNOSIS — C7989 Secondary malignant neoplasm of other specified sites: Secondary | ICD-10-CM | POA: Diagnosis not present

## 2017-11-15 DIAGNOSIS — C7972 Secondary malignant neoplasm of left adrenal gland: Secondary | ICD-10-CM | POA: Diagnosis not present

## 2017-11-15 DIAGNOSIS — C50911 Malignant neoplasm of unspecified site of right female breast: Secondary | ICD-10-CM | POA: Diagnosis not present

## 2017-11-15 DIAGNOSIS — C7802 Secondary malignant neoplasm of left lung: Secondary | ICD-10-CM | POA: Diagnosis not present

## 2017-11-15 DIAGNOSIS — C787 Secondary malignant neoplasm of liver and intrahepatic bile duct: Secondary | ICD-10-CM | POA: Diagnosis not present

## 2017-11-15 DIAGNOSIS — C50812 Malignant neoplasm of overlapping sites of left female breast: Secondary | ICD-10-CM | POA: Diagnosis not present

## 2017-11-19 DIAGNOSIS — D649 Anemia, unspecified: Secondary | ICD-10-CM | POA: Diagnosis not present

## 2017-11-19 DIAGNOSIS — E039 Hypothyroidism, unspecified: Secondary | ICD-10-CM | POA: Diagnosis not present

## 2017-11-19 DIAGNOSIS — I10 Essential (primary) hypertension: Secondary | ICD-10-CM | POA: Diagnosis not present

## 2017-11-20 DIAGNOSIS — C50911 Malignant neoplasm of unspecified site of right female breast: Secondary | ICD-10-CM | POA: Diagnosis not present

## 2017-11-20 DIAGNOSIS — C50812 Malignant neoplasm of overlapping sites of left female breast: Secondary | ICD-10-CM | POA: Diagnosis not present

## 2017-11-20 DIAGNOSIS — C787 Secondary malignant neoplasm of liver and intrahepatic bile duct: Secondary | ICD-10-CM | POA: Diagnosis not present

## 2017-11-20 DIAGNOSIS — C7989 Secondary malignant neoplasm of other specified sites: Secondary | ICD-10-CM | POA: Diagnosis not present

## 2017-11-20 DIAGNOSIS — C7802 Secondary malignant neoplasm of left lung: Secondary | ICD-10-CM | POA: Diagnosis not present

## 2017-11-20 DIAGNOSIS — C7972 Secondary malignant neoplasm of left adrenal gland: Secondary | ICD-10-CM | POA: Diagnosis not present

## 2017-11-21 DIAGNOSIS — C787 Secondary malignant neoplasm of liver and intrahepatic bile duct: Secondary | ICD-10-CM | POA: Diagnosis not present

## 2017-11-21 DIAGNOSIS — C7972 Secondary malignant neoplasm of left adrenal gland: Secondary | ICD-10-CM | POA: Diagnosis not present

## 2017-11-21 DIAGNOSIS — C7802 Secondary malignant neoplasm of left lung: Secondary | ICD-10-CM | POA: Diagnosis not present

## 2017-11-21 DIAGNOSIS — C7989 Secondary malignant neoplasm of other specified sites: Secondary | ICD-10-CM | POA: Diagnosis not present

## 2017-11-21 DIAGNOSIS — C50812 Malignant neoplasm of overlapping sites of left female breast: Secondary | ICD-10-CM | POA: Diagnosis not present

## 2017-11-21 DIAGNOSIS — C50911 Malignant neoplasm of unspecified site of right female breast: Secondary | ICD-10-CM | POA: Diagnosis not present

## 2017-11-22 DIAGNOSIS — C7989 Secondary malignant neoplasm of other specified sites: Secondary | ICD-10-CM | POA: Diagnosis not present

## 2017-11-22 DIAGNOSIS — C7972 Secondary malignant neoplasm of left adrenal gland: Secondary | ICD-10-CM | POA: Diagnosis not present

## 2017-11-22 DIAGNOSIS — C50812 Malignant neoplasm of overlapping sites of left female breast: Secondary | ICD-10-CM | POA: Diagnosis not present

## 2017-11-22 DIAGNOSIS — C787 Secondary malignant neoplasm of liver and intrahepatic bile duct: Secondary | ICD-10-CM | POA: Diagnosis not present

## 2017-11-22 DIAGNOSIS — C50911 Malignant neoplasm of unspecified site of right female breast: Secondary | ICD-10-CM | POA: Diagnosis not present

## 2017-11-22 DIAGNOSIS — C7802 Secondary malignant neoplasm of left lung: Secondary | ICD-10-CM | POA: Diagnosis not present

## 2017-11-27 DIAGNOSIS — C50812 Malignant neoplasm of overlapping sites of left female breast: Secondary | ICD-10-CM | POA: Diagnosis not present

## 2017-11-27 DIAGNOSIS — C7802 Secondary malignant neoplasm of left lung: Secondary | ICD-10-CM | POA: Diagnosis not present

## 2017-11-27 DIAGNOSIS — C50911 Malignant neoplasm of unspecified site of right female breast: Secondary | ICD-10-CM | POA: Diagnosis not present

## 2017-11-27 DIAGNOSIS — C7972 Secondary malignant neoplasm of left adrenal gland: Secondary | ICD-10-CM | POA: Diagnosis not present

## 2017-11-27 DIAGNOSIS — C7989 Secondary malignant neoplasm of other specified sites: Secondary | ICD-10-CM | POA: Diagnosis not present

## 2017-11-27 DIAGNOSIS — C787 Secondary malignant neoplasm of liver and intrahepatic bile duct: Secondary | ICD-10-CM | POA: Diagnosis not present

## 2017-11-29 DIAGNOSIS — C7989 Secondary malignant neoplasm of other specified sites: Secondary | ICD-10-CM | POA: Diagnosis not present

## 2017-11-29 DIAGNOSIS — C7802 Secondary malignant neoplasm of left lung: Secondary | ICD-10-CM | POA: Diagnosis not present

## 2017-11-29 DIAGNOSIS — C7972 Secondary malignant neoplasm of left adrenal gland: Secondary | ICD-10-CM | POA: Diagnosis not present

## 2017-11-29 DIAGNOSIS — C50911 Malignant neoplasm of unspecified site of right female breast: Secondary | ICD-10-CM | POA: Diagnosis not present

## 2017-11-29 DIAGNOSIS — C50812 Malignant neoplasm of overlapping sites of left female breast: Secondary | ICD-10-CM | POA: Diagnosis not present

## 2017-11-29 DIAGNOSIS — C787 Secondary malignant neoplasm of liver and intrahepatic bile duct: Secondary | ICD-10-CM | POA: Diagnosis not present

## 2017-12-04 DIAGNOSIS — C7989 Secondary malignant neoplasm of other specified sites: Secondary | ICD-10-CM | POA: Diagnosis not present

## 2017-12-04 DIAGNOSIS — C7972 Secondary malignant neoplasm of left adrenal gland: Secondary | ICD-10-CM | POA: Diagnosis not present

## 2017-12-04 DIAGNOSIS — C50812 Malignant neoplasm of overlapping sites of left female breast: Secondary | ICD-10-CM | POA: Diagnosis not present

## 2017-12-04 DIAGNOSIS — C50911 Malignant neoplasm of unspecified site of right female breast: Secondary | ICD-10-CM | POA: Diagnosis not present

## 2017-12-04 DIAGNOSIS — C7802 Secondary malignant neoplasm of left lung: Secondary | ICD-10-CM | POA: Diagnosis not present

## 2017-12-04 DIAGNOSIS — C787 Secondary malignant neoplasm of liver and intrahepatic bile duct: Secondary | ICD-10-CM | POA: Diagnosis not present

## 2017-12-05 DIAGNOSIS — C50812 Malignant neoplasm of overlapping sites of left female breast: Secondary | ICD-10-CM | POA: Diagnosis not present

## 2017-12-05 DIAGNOSIS — C50911 Malignant neoplasm of unspecified site of right female breast: Secondary | ICD-10-CM | POA: Diagnosis not present

## 2017-12-05 DIAGNOSIS — C787 Secondary malignant neoplasm of liver and intrahepatic bile duct: Secondary | ICD-10-CM | POA: Diagnosis not present

## 2017-12-05 DIAGNOSIS — C7989 Secondary malignant neoplasm of other specified sites: Secondary | ICD-10-CM | POA: Diagnosis not present

## 2017-12-05 DIAGNOSIS — C7802 Secondary malignant neoplasm of left lung: Secondary | ICD-10-CM | POA: Diagnosis not present

## 2017-12-05 DIAGNOSIS — C7972 Secondary malignant neoplasm of left adrenal gland: Secondary | ICD-10-CM | POA: Diagnosis not present

## 2017-12-06 DIAGNOSIS — C50812 Malignant neoplasm of overlapping sites of left female breast: Secondary | ICD-10-CM | POA: Diagnosis not present

## 2017-12-06 DIAGNOSIS — I120 Hypertensive chronic kidney disease with stage 5 chronic kidney disease or end stage renal disease: Secondary | ICD-10-CM | POA: Diagnosis not present

## 2017-12-06 DIAGNOSIS — R410 Disorientation, unspecified: Secondary | ICD-10-CM | POA: Diagnosis not present

## 2017-12-06 DIAGNOSIS — C7802 Secondary malignant neoplasm of left lung: Secondary | ICD-10-CM | POA: Diagnosis not present

## 2017-12-06 DIAGNOSIS — R443 Hallucinations, unspecified: Secondary | ICD-10-CM | POA: Diagnosis not present

## 2017-12-06 DIAGNOSIS — R634 Abnormal weight loss: Secondary | ICD-10-CM | POA: Diagnosis not present

## 2017-12-06 DIAGNOSIS — J439 Emphysema, unspecified: Secondary | ICD-10-CM | POA: Diagnosis not present

## 2017-12-06 DIAGNOSIS — Z9181 History of falling: Secondary | ICD-10-CM | POA: Diagnosis not present

## 2017-12-06 DIAGNOSIS — F419 Anxiety disorder, unspecified: Secondary | ICD-10-CM | POA: Diagnosis not present

## 2017-12-06 DIAGNOSIS — N182 Chronic kidney disease, stage 2 (mild): Secondary | ICD-10-CM | POA: Diagnosis not present

## 2017-12-06 DIAGNOSIS — C778 Secondary and unspecified malignant neoplasm of lymph nodes of multiple regions: Secondary | ICD-10-CM | POA: Diagnosis not present

## 2017-12-06 DIAGNOSIS — Z85820 Personal history of malignant melanoma of skin: Secondary | ICD-10-CM | POA: Diagnosis not present

## 2017-12-06 DIAGNOSIS — C7989 Secondary malignant neoplasm of other specified sites: Secondary | ICD-10-CM | POA: Diagnosis not present

## 2017-12-06 DIAGNOSIS — J90 Pleural effusion, not elsewhere classified: Secondary | ICD-10-CM | POA: Diagnosis not present

## 2017-12-06 DIAGNOSIS — R32 Unspecified urinary incontinence: Secondary | ICD-10-CM | POA: Diagnosis not present

## 2017-12-06 DIAGNOSIS — C7972 Secondary malignant neoplasm of left adrenal gland: Secondary | ICD-10-CM | POA: Diagnosis not present

## 2017-12-06 DIAGNOSIS — C787 Secondary malignant neoplasm of liver and intrahepatic bile duct: Secondary | ICD-10-CM | POA: Diagnosis not present

## 2017-12-06 DIAGNOSIS — C50911 Malignant neoplasm of unspecified site of right female breast: Secondary | ICD-10-CM | POA: Diagnosis not present

## 2017-12-06 DIAGNOSIS — F329 Major depressive disorder, single episode, unspecified: Secondary | ICD-10-CM | POA: Diagnosis not present

## 2017-12-06 DIAGNOSIS — F17210 Nicotine dependence, cigarettes, uncomplicated: Secondary | ICD-10-CM | POA: Diagnosis not present

## 2017-12-11 DIAGNOSIS — C7989 Secondary malignant neoplasm of other specified sites: Secondary | ICD-10-CM | POA: Diagnosis not present

## 2017-12-11 DIAGNOSIS — C50911 Malignant neoplasm of unspecified site of right female breast: Secondary | ICD-10-CM | POA: Diagnosis not present

## 2017-12-11 DIAGNOSIS — C7802 Secondary malignant neoplasm of left lung: Secondary | ICD-10-CM | POA: Diagnosis not present

## 2017-12-11 DIAGNOSIS — C50812 Malignant neoplasm of overlapping sites of left female breast: Secondary | ICD-10-CM | POA: Diagnosis not present

## 2017-12-11 DIAGNOSIS — C787 Secondary malignant neoplasm of liver and intrahepatic bile duct: Secondary | ICD-10-CM | POA: Diagnosis not present

## 2017-12-11 DIAGNOSIS — C7972 Secondary malignant neoplasm of left adrenal gland: Secondary | ICD-10-CM | POA: Diagnosis not present

## 2017-12-13 DIAGNOSIS — C50911 Malignant neoplasm of unspecified site of right female breast: Secondary | ICD-10-CM | POA: Diagnosis not present

## 2017-12-13 DIAGNOSIS — C7802 Secondary malignant neoplasm of left lung: Secondary | ICD-10-CM | POA: Diagnosis not present

## 2017-12-13 DIAGNOSIS — C50812 Malignant neoplasm of overlapping sites of left female breast: Secondary | ICD-10-CM | POA: Diagnosis not present

## 2017-12-13 DIAGNOSIS — C7989 Secondary malignant neoplasm of other specified sites: Secondary | ICD-10-CM | POA: Diagnosis not present

## 2017-12-13 DIAGNOSIS — C7972 Secondary malignant neoplasm of left adrenal gland: Secondary | ICD-10-CM | POA: Diagnosis not present

## 2017-12-13 DIAGNOSIS — C787 Secondary malignant neoplasm of liver and intrahepatic bile duct: Secondary | ICD-10-CM | POA: Diagnosis not present

## 2017-12-16 DIAGNOSIS — W19XXXA Unspecified fall, initial encounter: Secondary | ICD-10-CM | POA: Diagnosis not present

## 2017-12-16 DIAGNOSIS — R634 Abnormal weight loss: Secondary | ICD-10-CM | POA: Diagnosis not present

## 2017-12-16 DIAGNOSIS — C50919 Malignant neoplasm of unspecified site of unspecified female breast: Secondary | ICD-10-CM | POA: Diagnosis not present

## 2017-12-18 DIAGNOSIS — C50812 Malignant neoplasm of overlapping sites of left female breast: Secondary | ICD-10-CM | POA: Diagnosis not present

## 2017-12-18 DIAGNOSIS — C7802 Secondary malignant neoplasm of left lung: Secondary | ICD-10-CM | POA: Diagnosis not present

## 2017-12-18 DIAGNOSIS — C50911 Malignant neoplasm of unspecified site of right female breast: Secondary | ICD-10-CM | POA: Diagnosis not present

## 2017-12-18 DIAGNOSIS — C787 Secondary malignant neoplasm of liver and intrahepatic bile duct: Secondary | ICD-10-CM | POA: Diagnosis not present

## 2017-12-18 DIAGNOSIS — C7972 Secondary malignant neoplasm of left adrenal gland: Secondary | ICD-10-CM | POA: Diagnosis not present

## 2017-12-18 DIAGNOSIS — C7989 Secondary malignant neoplasm of other specified sites: Secondary | ICD-10-CM | POA: Diagnosis not present

## 2017-12-20 DIAGNOSIS — C7989 Secondary malignant neoplasm of other specified sites: Secondary | ICD-10-CM | POA: Diagnosis not present

## 2017-12-20 DIAGNOSIS — C50812 Malignant neoplasm of overlapping sites of left female breast: Secondary | ICD-10-CM | POA: Diagnosis not present

## 2017-12-20 DIAGNOSIS — C787 Secondary malignant neoplasm of liver and intrahepatic bile duct: Secondary | ICD-10-CM | POA: Diagnosis not present

## 2017-12-20 DIAGNOSIS — C50911 Malignant neoplasm of unspecified site of right female breast: Secondary | ICD-10-CM | POA: Diagnosis not present

## 2017-12-20 DIAGNOSIS — C7802 Secondary malignant neoplasm of left lung: Secondary | ICD-10-CM | POA: Diagnosis not present

## 2017-12-20 DIAGNOSIS — C7972 Secondary malignant neoplasm of left adrenal gland: Secondary | ICD-10-CM | POA: Diagnosis not present

## 2017-12-22 DIAGNOSIS — C7989 Secondary malignant neoplasm of other specified sites: Secondary | ICD-10-CM | POA: Diagnosis not present

## 2017-12-22 DIAGNOSIS — C7802 Secondary malignant neoplasm of left lung: Secondary | ICD-10-CM | POA: Diagnosis not present

## 2017-12-22 DIAGNOSIS — C787 Secondary malignant neoplasm of liver and intrahepatic bile duct: Secondary | ICD-10-CM | POA: Diagnosis not present

## 2017-12-22 DIAGNOSIS — C50812 Malignant neoplasm of overlapping sites of left female breast: Secondary | ICD-10-CM | POA: Diagnosis not present

## 2017-12-22 DIAGNOSIS — C7972 Secondary malignant neoplasm of left adrenal gland: Secondary | ICD-10-CM | POA: Diagnosis not present

## 2017-12-22 DIAGNOSIS — C50911 Malignant neoplasm of unspecified site of right female breast: Secondary | ICD-10-CM | POA: Diagnosis not present

## 2017-12-23 DIAGNOSIS — C7972 Secondary malignant neoplasm of left adrenal gland: Secondary | ICD-10-CM | POA: Diagnosis not present

## 2017-12-23 DIAGNOSIS — R0902 Hypoxemia: Secondary | ICD-10-CM | POA: Diagnosis not present

## 2017-12-23 DIAGNOSIS — C50919 Malignant neoplasm of unspecified site of unspecified female breast: Secondary | ICD-10-CM | POA: Diagnosis not present

## 2017-12-23 DIAGNOSIS — C7989 Secondary malignant neoplasm of other specified sites: Secondary | ICD-10-CM | POA: Diagnosis not present

## 2017-12-23 DIAGNOSIS — C50911 Malignant neoplasm of unspecified site of right female breast: Secondary | ICD-10-CM | POA: Diagnosis not present

## 2017-12-23 DIAGNOSIS — R634 Abnormal weight loss: Secondary | ICD-10-CM | POA: Diagnosis not present

## 2017-12-23 DIAGNOSIS — C50812 Malignant neoplasm of overlapping sites of left female breast: Secondary | ICD-10-CM | POA: Diagnosis not present

## 2017-12-23 DIAGNOSIS — C7802 Secondary malignant neoplasm of left lung: Secondary | ICD-10-CM | POA: Diagnosis not present

## 2017-12-23 DIAGNOSIS — C787 Secondary malignant neoplasm of liver and intrahepatic bile duct: Secondary | ICD-10-CM | POA: Diagnosis not present

## 2017-12-24 DIAGNOSIS — C7972 Secondary malignant neoplasm of left adrenal gland: Secondary | ICD-10-CM | POA: Diagnosis not present

## 2017-12-24 DIAGNOSIS — C50911 Malignant neoplasm of unspecified site of right female breast: Secondary | ICD-10-CM | POA: Diagnosis not present

## 2017-12-24 DIAGNOSIS — C50812 Malignant neoplasm of overlapping sites of left female breast: Secondary | ICD-10-CM | POA: Diagnosis not present

## 2017-12-24 DIAGNOSIS — C787 Secondary malignant neoplasm of liver and intrahepatic bile duct: Secondary | ICD-10-CM | POA: Diagnosis not present

## 2017-12-24 DIAGNOSIS — C7802 Secondary malignant neoplasm of left lung: Secondary | ICD-10-CM | POA: Diagnosis not present

## 2017-12-24 DIAGNOSIS — C7989 Secondary malignant neoplasm of other specified sites: Secondary | ICD-10-CM | POA: Diagnosis not present

## 2017-12-25 DIAGNOSIS — C7972 Secondary malignant neoplasm of left adrenal gland: Secondary | ICD-10-CM | POA: Diagnosis not present

## 2017-12-25 DIAGNOSIS — C7802 Secondary malignant neoplasm of left lung: Secondary | ICD-10-CM | POA: Diagnosis not present

## 2017-12-25 DIAGNOSIS — C7989 Secondary malignant neoplasm of other specified sites: Secondary | ICD-10-CM | POA: Diagnosis not present

## 2017-12-25 DIAGNOSIS — C50911 Malignant neoplasm of unspecified site of right female breast: Secondary | ICD-10-CM | POA: Diagnosis not present

## 2017-12-25 DIAGNOSIS — C50812 Malignant neoplasm of overlapping sites of left female breast: Secondary | ICD-10-CM | POA: Diagnosis not present

## 2017-12-25 DIAGNOSIS — C787 Secondary malignant neoplasm of liver and intrahepatic bile duct: Secondary | ICD-10-CM | POA: Diagnosis not present

## 2018-01-06 DEATH — deceased
